# Patient Record
Sex: Female | Born: 1975
Health system: Southern US, Community
[De-identification: ages and names within clinical notes are randomized; demographics above are authoritative.]

## PROBLEM LIST (undated history)

## (undated) DIAGNOSIS — I499 Cardiac arrhythmia, unspecified: Secondary | ICD-10-CM

## (undated) DIAGNOSIS — M1990 Unspecified osteoarthritis, unspecified site: Secondary | ICD-10-CM

## (undated) DIAGNOSIS — Z8489 Family history of other specified conditions: Secondary | ICD-10-CM

## (undated) DIAGNOSIS — L309 Dermatitis, unspecified: Secondary | ICD-10-CM

## (undated) DIAGNOSIS — K219 Gastro-esophageal reflux disease without esophagitis: Secondary | ICD-10-CM

## (undated) DIAGNOSIS — T4145XA Adverse effect of unspecified anesthetic, initial encounter: Secondary | ICD-10-CM

## (undated) DIAGNOSIS — J45909 Unspecified asthma, uncomplicated: Secondary | ICD-10-CM

## (undated) DIAGNOSIS — L509 Urticaria, unspecified: Secondary | ICD-10-CM

## (undated) DIAGNOSIS — E785 Hyperlipidemia, unspecified: Secondary | ICD-10-CM

## (undated) DIAGNOSIS — R112 Nausea with vomiting, unspecified: Secondary | ICD-10-CM

## (undated) DIAGNOSIS — J069 Acute upper respiratory infection, unspecified: Secondary | ICD-10-CM

## (undated) DIAGNOSIS — F32A Depression, unspecified: Secondary | ICD-10-CM

## (undated) DIAGNOSIS — F419 Anxiety disorder, unspecified: Secondary | ICD-10-CM

## (undated) DIAGNOSIS — R519 Headache, unspecified: Secondary | ICD-10-CM

## (undated) DIAGNOSIS — I456 Pre-excitation syndrome: Secondary | ICD-10-CM

## (undated) DIAGNOSIS — G709 Myoneural disorder, unspecified: Secondary | ICD-10-CM

## (undated) DIAGNOSIS — T783XXA Angioneurotic edema, initial encounter: Secondary | ICD-10-CM

## (undated) DIAGNOSIS — J189 Pneumonia, unspecified organism: Secondary | ICD-10-CM

## (undated) DIAGNOSIS — E669 Obesity, unspecified: Secondary | ICD-10-CM

## (undated) DIAGNOSIS — I10 Essential (primary) hypertension: Secondary | ICD-10-CM

## (undated) DIAGNOSIS — Z9889 Other specified postprocedural states: Secondary | ICD-10-CM

## (undated) DIAGNOSIS — R51 Headache: Secondary | ICD-10-CM

## (undated) DIAGNOSIS — D563 Thalassemia minor: Secondary | ICD-10-CM

## (undated) DIAGNOSIS — E119 Type 2 diabetes mellitus without complications: Secondary | ICD-10-CM

## (undated) DIAGNOSIS — T8859XA Other complications of anesthesia, initial encounter: Secondary | ICD-10-CM

## (undated) HISTORY — DX: Angioneurotic edema, initial encounter: T78.3XXA

## (undated) HISTORY — PX: TUBAL LIGATION: SHX77

## (undated) HISTORY — DX: Type 2 diabetes mellitus without complications: E11.9

## (undated) HISTORY — PX: BLADDER REPAIR: SHX76

## (undated) HISTORY — DX: Urticaria, unspecified: L50.9

## (undated) HISTORY — PX: BREAST REDUCTION SURGERY: SHX8

## (undated) HISTORY — DX: Unspecified asthma, uncomplicated: J45.909

## (undated) HISTORY — PX: ABDOMINAL HYSTERECTOMY: SHX81

## (undated) HISTORY — DX: Acute upper respiratory infection, unspecified: J06.9

## (undated) HISTORY — DX: Dermatitis, unspecified: L30.9

## (undated) HISTORY — DX: Gastro-esophageal reflux disease without esophagitis: K21.9

## (undated) HISTORY — DX: Obesity, unspecified: E66.9

---

## 1999-10-01 HISTORY — PX: REDUCTION MAMMAPLASTY: SUR839

## 2013-10-09 ENCOUNTER — Emergency Department (HOSPITAL_COMMUNITY)
Admission: EM | Admit: 2013-10-09 | Discharge: 2013-10-09 | Disposition: A | Discharge status: Home / Self Care | Payer: Self-pay | Attending: Emergency Medicine | Admitting: Emergency Medicine

## 2013-10-09 ENCOUNTER — Emergency Department (HOSPITAL_COMMUNITY): Payer: Self-pay

## 2013-10-09 ENCOUNTER — Encounter (HOSPITAL_COMMUNITY): Payer: Self-pay | Admitting: Emergency Medicine

## 2013-10-09 DIAGNOSIS — J40 Bronchitis, not specified as acute or chronic: Secondary | ICD-10-CM

## 2013-10-09 DIAGNOSIS — Z880 Allergy status to penicillin: Secondary | ICD-10-CM | POA: Insufficient documentation

## 2013-10-09 DIAGNOSIS — I10 Essential (primary) hypertension: Secondary | ICD-10-CM | POA: Insufficient documentation

## 2013-10-09 DIAGNOSIS — J069 Acute upper respiratory infection, unspecified: Secondary | ICD-10-CM

## 2013-10-09 DIAGNOSIS — J310 Chronic rhinitis: Secondary | ICD-10-CM | POA: Insufficient documentation

## 2013-10-09 DIAGNOSIS — J3489 Other specified disorders of nose and nasal sinuses: Secondary | ICD-10-CM | POA: Insufficient documentation

## 2013-10-09 DIAGNOSIS — E785 Hyperlipidemia, unspecified: Secondary | ICD-10-CM | POA: Insufficient documentation

## 2013-10-09 DIAGNOSIS — Z79899 Other long term (current) drug therapy: Secondary | ICD-10-CM | POA: Insufficient documentation

## 2013-10-09 DIAGNOSIS — Z87891 Personal history of nicotine dependence: Secondary | ICD-10-CM | POA: Insufficient documentation

## 2013-10-09 DIAGNOSIS — J209 Acute bronchitis, unspecified: Secondary | ICD-10-CM | POA: Insufficient documentation

## 2013-10-09 HISTORY — DX: Pre-excitation syndrome: I45.6

## 2013-10-09 HISTORY — DX: Essential (primary) hypertension: I10

## 2013-10-09 HISTORY — DX: Hyperlipidemia, unspecified: E78.5

## 2013-10-09 MED ORDER — HYDROCOD POLST-CHLORPHEN POLST 10-8 MG/5ML PO LQCR: 5.0000 mL | Freq: Two times a day (BID) | ORAL | Status: DC | PRN

## 2013-10-09 MED ORDER — HYDROCOD POLST-CHLORPHEN POLST 10-8 MG/5ML PO LQCR
5.0000 mL | Freq: Once | ORAL | Status: AC
  Administered 2013-10-09: 5 mL via ORAL
  Filled 2013-10-09: qty 5

## 2013-10-09 NOTE — Discharge Instructions (Signed)
Bronchitis Bronchitis is swelling (inflammation) of the air tubes leading to your lungs (bronchi). This causes mucus and a cough. If the swelling gets bad, you may have trouble breathing. HOME CARE   Rest.  Drink enough fluids to keep your pee (urine) clear or pale yellow (unless you have a condition where you have to watch how much you drink).  Only take medicine as told by your doctor. If you were given antibiotic medicines, finish them even if you start to feel better.  Avoid smoke, irritating chemicals, and strong smells. These make the problem worse. Quit smoking if you smoke. This helps your lungs heal faster.  Use a cool mist humidifier. Change the water in the humidifier every day. You can also sit in the bathroom with hot shower running for 5 10 minutes. Keep the door closed.  See your health care provider as told.  Wash your hands often. GET HELP IF: Your problems do not get better after 1 week. GET HELP RIGHT AWAY IF:   Your fever gets worse.  You have chills.  Your chest hurts.  Your problems breathing get worse.  You have blood in your mucus.  You pass out (faint).  You feel lightheaded.  You have a bad headache.  You throw up (vomit) again and again. MAKE SURE YOU:  Understand these instructions.  Will watch your condition.  Will get help right away if you are not doing well or get worse. Document Released: 03/04/2008 Document Revised: 07/07/2013 Document Reviewed: 05/11/2013 [facility] Patient Information 2014 [employer], [place]. It is safe to take Coricidin HBP with your blood pressure medications for your, congestion.  You've also been given a prescription for Tussionex, achieving take for your cough so, you can sleep at night

## 2013-10-09 NOTE — ED Provider Notes (Signed)
Medical screening examination/treatment/procedure(s) were performed by non-physician practitioner and as supervising physician I was immediately available for consultation/collaboration.  Flint MelterElliott L Uvaldo Rybacki, MD 10/09/13 [id number]

## 2013-10-09 NOTE — ED Provider Notes (Signed)
CSN: [id number]     Arrival date & time 10/09/13  0142 History   First MD Initiated Contact with Carolyn Phillips 10/09/13 0320     Chief Complaint  Carolyn Phillips presents with  . Cough  . Pleurisy   (Consider location/radiation/quality/duration/timing/severity/associated sxs/prior Treatment) HPI Comments: Carolyn Phillips with URI, symptoms, including rhinorrhea, ingestion, ear fullness, worsens, and chronic cough 22 bronchitis.  Has been taking over-the-counter Mucinex and Robitussin without any relief.  Denies any fever, shortness of breath, chest pain  Carolyn Phillips is a 38 y.o. female presenting with cough. The history is provided by the Carolyn Phillips.  Cough Cough characteristics:  Productive Sputum characteristics:  Green Severity:  Mild Duration:  2 days Timing:  Intermittent Chronicity:  Recurrent Relieved by:  Nothing Worsened by:  Activity Ineffective treatments: Mucinex and Robitussin. Associated symptoms: ear fullness, ear pain, rhinorrhea and sinus congestion   Associated symptoms: no chest pain, no chills, no fever, no headaches, no myalgias, no shortness of breath, no sore throat and no wheezing     Past Medical History  Diagnosis Date  . Wolff-Parkinson-White syndrome   . Hypertension   . Hyperlipemia    Past Surgical History  Procedure Laterality Date  . Abdominal hysterectomy    . Bladder repair    . Breast reduction surgery     History reviewed. No pertinent family history. History  Substance Use Topics  . Smoking status: Former Games developermoker  . Smokeless tobacco: Not on file  . Alcohol Use: Yes   OB History   Grav Para Term Preterm Abortions TAB SAB Ect Mult Living                 Review of Systems  Constitutional: Negative for fever and chills.  HENT: Positive for congestion, ear pain and rhinorrhea. Negative for ear discharge, sore throat and trouble swallowing.   Respiratory: Positive for cough. Negative for shortness of breath and wheezing.   Cardiovascular: Negative for chest  pain.  Gastrointestinal: Negative for nausea.  Musculoskeletal: Negative for myalgias.  Neurological: Negative for dizziness and headaches.  All other systems reviewed and are negative.    Allergies  Penicillins  Home Medications   Current Outpatient Rx  Name  Route  Sig  Dispense  Refill  . Atorvastatin Calcium (LIPITOR PO)   Oral   Take 1 tablet by mouth daily.         Marland Kitchen. BIOTIN PO   Oral   Take 1 tablet by mouth daily.         . Cholecalciferol (VITAMIN D PO)   Oral   Take 1 tablet by mouth daily.         . Cyanocobalamin (VITAMIN B 12 PO)   Oral   Take 1 tablet by mouth daily.         . hydrochlorothiazide (HYDRODIURIL) 25 MG tablet   Oral   Take 25 mg by mouth daily.         . Multiple Vitamins-Minerals (ZINC PO)   Oral   Take 1 tablet by mouth daily.         . chlorpheniramine-HYDROcodone (TUSSIONEX) 10-8 MG/5ML LQCR   Oral   Take 5 mLs by mouth every 12 (twelve) hours as needed for cough.   60 mL   0    BP 142/94  Pulse 85  Temp(Src) 97.8 F (36.6 C) (Oral)  Resp 16  SpO2 99% Physical Exam  Vitals reviewed. Constitutional: She is oriented to person, place, and time. She appears well-developed and well-nourished.  HENT:  Head: Normocephalic.  Right Ear: External ear normal.  Left Ear: External ear normal.  Mouth/Throat: Oropharynx is clear and moist.  Voice is hoarse  Eyes: Pupils are equal, round, and reactive to light.  Neck: Normal range of motion.  Cardiovascular: Normal rate and regular rhythm.   Pulmonary/Chest: Effort normal and breath sounds normal.  Abdominal: Soft. She exhibits no distension. There is no tenderness.  Musculoskeletal: Normal range of motion.  Lymphadenopathy:    She has no cervical adenopathy.  Neurological: She is alert and oriented to person, place, and time.  Skin: Skin is dry.    ED Course  Procedures (including critical care time) Labs Review Labs Reviewed - No data to display Imaging  Review Dg Chest 2 View  10/09/2013   CLINICAL DATA:  Cough.  Intermittent fever.  EXAM: CHEST  2 VIEW  COMPARISON:  None.  FINDINGS: Heart size and mediastinal contours are within normal limits. Both lungs are clear. Visualized skeletal structures are unremarkable.  IMPRESSION: Negative exam.   Electronically Signed   By: Drusilla Kanner M.D.   On: 10/09/2013 02:34    EKG Interpretation   None       MDM   1. URI (upper respiratory infection)   2. Bronchitis        Arman Filter, NP 10/09/13 1610  Arman Filter, NP 10/09/13 [phone]

## 2013-10-09 NOTE — ED Notes (Signed)
Pt reports "tasting blood" when she coughs. Pt states that cough has been present x 1 week. Pt reports coughing up green sputum, and green mucus.

## 2013-10-09 NOTE — ED Notes (Signed)
Productive cough (green), "sinus infection, ears feel clogged." chest wall pain with coughing. Hx. Bronchitis.

## 2014-04-05 ENCOUNTER — Encounter: Payer: 59 | Attending: Internal Medicine

## 2014-04-05 VITALS — Ht 62.5 in | Wt 216.7 lb

## 2014-04-05 DIAGNOSIS — Z713 Dietary counseling and surveillance: Secondary | ICD-10-CM | POA: Insufficient documentation

## 2014-04-05 DIAGNOSIS — E119 Type 2 diabetes mellitus without complications: Secondary | ICD-10-CM | POA: Insufficient documentation

## 2014-04-05 NOTE — Progress Notes (Signed)
Patient was seen on 04/05/14 for the first of a series of three diabetes self-management courses at the [facility].  Current HbA1c: 6.7%  The following learning objectives were met by the patient during this class:  Describe diabetes  State some common risk factors for diabetes  Defines the role of glucose and insulin  Identifies type of diabetes and pathophysiology  Describe the relationship between diabetes and cardiovascular risk  State the members of the Healthcare Team  States the rationale for glucose monitoring  State when to test glucose  State their individual Target Range  State the importance of logging glucose readings  Describe how to interpret glucose readings  Identifies A1C target  Explain the correlation between A1c and eAG values  State symptoms and treatment of high blood glucose  State symptoms and treatment of low blood glucose  Explain proper technique for glucose testing  Identifies proper sharps disposal  Handouts given during class include:  Living Well with Diabetes book  Carb Counting and Meal Planning book  Meal Plan Card  Carbohydrate guide  Meal planning worksheet  Low Sodium Flavoring Tips  The diabetes portion plate  T9H to eAG Conversion Chart  Diabetes Medications  Diabetes Recommended Care Schedule  Support Group  Diabetes Success Plan  Core Class Satisfaction Survey  Follow-Up Plan:  Attend core 2

## 2014-04-12 DIAGNOSIS — E119 Type 2 diabetes mellitus without complications: Secondary | ICD-10-CM

## 2014-04-12 NOTE — Progress Notes (Signed)

## 2014-04-15 ENCOUNTER — Institutional Professional Consult (permissible substitution): Payer: Self-pay | Admitting: Interventional Cardiology

## 2014-04-19 DIAGNOSIS — E119 Type 2 diabetes mellitus without complications: Secondary | ICD-10-CM

## 2014-04-19 NOTE — Progress Notes (Signed)
Patient was seen on 04/19/14 for the third of a series of three diabetes self-management courses at the [facility]. The following learning objectives were met by the patient during this class:    State the amount of activity recommended for healthy living   Describe activities suitable for individual needs   Identify ways to regularly incorporate activity into daily life   Identify barriers to activity and ways to over come these barriers  Identify diabetes medications being personally used and their primary action for lowering glucose and possible side effects   Describe role of stress on blood glucose and develop strategies to address psychosocial issues   Identify diabetes complications and ways to prevent them  Explain how to manage diabetes during illness   Evaluate success in meeting personal goal   Establish 2-3 goals that they will plan to diligently work on until they return for the  42-monthfollow-up visit  Goals:  Follow Diabetes Meal Plan as instructed  Aim for 15-30 mins of physical activity daily as tolerated  Bring food record and glucose log to your follow up visit  Your patient has established the following 4 month goals in their individualized success plan: I will count my carb choices at most meals and snacks by using My Fitness Pal to count carbs and track steps per day I will reduce fat in my diet by eating less sugars as carbs 2 or more meals a day and eating my carb filled meal at lunch instead of dinner To help manage stress I will attend a Support Group at least 2 times a week  Your patient has identified these potential barriers to change:  Cost more to eat healthier  Your patient has identified their diabetes self-care support plan as  [facility]  Family Support  On-line resources  All of my Providers  Plan:  Attend Core 4 in 4 months

## 2014-05-25 ENCOUNTER — Encounter: Payer: Self-pay | Admitting: Interventional Cardiology

## 2014-05-25 ENCOUNTER — Ambulatory Visit (INDEPENDENT_AMBULATORY_CARE_PROVIDER_SITE_OTHER): Payer: 59 | Admitting: Interventional Cardiology

## 2014-05-25 VITALS — BP 130/86 | HR 68 | Ht 62.0 in | Wt 218.8 lb

## 2014-05-25 DIAGNOSIS — G4730 Sleep apnea, unspecified: Secondary | ICD-10-CM

## 2014-05-25 DIAGNOSIS — I456 Pre-excitation syndrome: Secondary | ICD-10-CM

## 2014-05-25 DIAGNOSIS — I10 Essential (primary) hypertension: Secondary | ICD-10-CM | POA: Insufficient documentation

## 2014-05-25 NOTE — Progress Notes (Signed)
Patient ID: Carolyn Phillips, female   DOB: 1976-05-02, 38 y.o.   MRN: [id number]   Date: 05/25/2014 ID: Carolyn Phillips, DOB 04-08-1976, MRN [id number] PCP: Gwynneth Aliment, MD  Reason: Palpitations and WPW  ASSESSMENT;  1. WPW with palpitations 2. Hypertension 3. Diabetes mellitus 4. Obesity 5. Snoring and probable sleep apnea  PLAN:  1. Sleep study. 2. Continuous ambulatory monitor for up to a month. 3. Consider referral to electrophysiology for accessory pathway ablation depending upon findings.   SUBJECTIVE: Carolyn Phillips is a 38 y.o. female who is a pleasant female who has a pre-existing history of WPW diagnosed when she was 38 years of age. She was told at that time that episodes of tachycardia were been precipitated by anxiety and stress. 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 Over the past several months she has had recurring episodes of palpitations associated with chest tightness, dyspnea, and weakness. These episodes last up to 3-5 minutes before resolving. She is frightened to by the increase in frequency and feels that if they were to persist for greater than 10-15 minutes she would have to go to the emergency room. She cannot predict when the episodes will occur. She has never had syncope. Many of the episodes have awakened her from sleep.   Allergies  Allergen Reactions  . Penicillins Swelling    Eye swelling    Current Outpatient Prescriptions on File Prior to Visit  Medication Sig Dispense Refill  . BIOTIN PO Take 1 tablet by mouth daily.      . Cholecalciferol (VITAMIN D PO) Take 1 tablet by mouth 2 (two) times a week.       . Cyanocobalamin (VITAMIN B 12 PO) Take 1 tablet by mouth daily.      . hydrochlorothiazide (HYDRODIURIL) 25 MG tablet Take 12.5 mg by mouth daily. 12.5 mg daily      . meclizine (ANTIVERT) 12.5 MG  tablet Take 12.5 mg by mouth 3 (three) times daily as needed for dizziness.      . Multiple Vitamins-Minerals (ZINC PO) Take 1 tablet by mouth daily.      . nebivolol (BYSTOLIC) 5 MG tablet Take 5 mg by mouth daily.      Marland Kitchen omeprazole (PRILOSEC) 20 MG capsule Take 20 mg by mouth daily.      . Probiotic Product (ALIGN) 4 MG CAPS Take by mouth.      . sitaGLIPtin-metformin (JANUMET) 50-500 MG per tablet Take 1 tablet by mouth 2 (two) times daily with a meal.       No current facility-administered medications on file prior to visit.    Past Medical History  Diagnosis Date  . Wolff-Parkinson-White syndrome   . Hypertension   . Hyperlipemia   . Diabetes mellitus without complication   . GERD (gastroesophageal reflux disease)   . Obesity     Past Surgical History  Procedure Laterality Date  . Abdominal hysterectomy    . Bladder repair    . Breast reduction surgery    . Tubal ligation      History   Social History  . Marital Status: Married    Spouse Name: N/A    Number of Children: N/A  . Years of Education: N/A   Occupational History  . Not on file.   Social History Main Topics  . Smoking status: Former Games developer  . Smokeless tobacco: Not on file  .  Alcohol Use: Yes  . Drug Use: No  . Sexual Activity: Not on file   Other Topics Concern  . Not on file   Social History Narrative  . No narrative on file    Family History  Problem Relation Age of Onset  . Valvular heart disease Mother     ROS: Denies syncope, claudication, edema, neurological symptoms, seizure disorder, anginal quality chest pain, and edema.. Other systems negative for complaints.  OBJECTIVE: BP 130/86  Pulse 68  Ht  (1.575 m)  Wt 218 lb 12.8 oz (99.247 kg)  BMI 40.01 kg/m2,  General: No acute distress, obese HEENT: normal without pallor or jaundice Neck: JVD flat. Carotids normal Chest: Clear Cardiac: Murmur: None. Gallop: S4. Rhythm: Normal. Other: Normal Abdomen: Bruit: Absent.  Pulsation: Absent Extremities: Edema: Absent. Pulses: 2+ and symmetric Neuro: Normal Psych: Normal  ECG: Sinus rhythm with WPW type B with upright complexes in one and aVL with negative complexes throughout the precordium and leftward axis.

## 2014-05-25 NOTE — Patient Instructions (Signed)
Your physician recommends that you continue on your current medications as directed. Please refer to the Current Medication list given to you today.   Your physician has requested that you have an echocardiogram. Echocardiography is a painless test that uses sound waves to create images of your heart. It provides your doctor with information about the size and shape of your heart and how well your heart's chambers and valves are working. This procedure takes approximately one hour. There are no restrictions for this procedure.   Your physician has recommended that you wear an event monitor. Event monitors are medical devices that record the heart's electrical activity. Doctors most often [place] these monitors to diagnose arrhythmias. Arrhythmias are problems with the speed or rhythm of the heartbeat. The monitor is a small, portable device. You can wear one while you do your normal daily activities. This is usually used to diagnose what is causing palpitations/syncope (passing out).  Your physician has recommended that you have a sleep study. This test records several body functions during sleep, including: brain activity, eye movement, oxygen and carbon dioxide blood levels, heart rate and rhythm, breathing rate and rhythm, the flow of air through your mouth and nose, snoring, body muscle movements, and chest and belly movement.  Your physician recommends that you schedule a follow-up appointment in: 1 month

## 2014-05-31 ENCOUNTER — Encounter: Payer: Self-pay | Admitting: *Deleted

## 2014-05-31 ENCOUNTER — Encounter (INDEPENDENT_AMBULATORY_CARE_PROVIDER_SITE_OTHER): Payer: BC Managed Care – PPO

## 2014-05-31 ENCOUNTER — Ambulatory Visit (HOSPITAL_COMMUNITY): Payer: BC Managed Care – PPO | Attending: Interventional Cardiology

## 2014-05-31 DIAGNOSIS — E669 Obesity, unspecified: Secondary | ICD-10-CM | POA: Diagnosis not present

## 2014-05-31 DIAGNOSIS — Z87891 Personal history of nicotine dependence: Secondary | ICD-10-CM | POA: Diagnosis not present

## 2014-05-31 DIAGNOSIS — I10 Essential (primary) hypertension: Secondary | ICD-10-CM | POA: Diagnosis not present

## 2014-05-31 DIAGNOSIS — I456 Pre-excitation syndrome: Secondary | ICD-10-CM | POA: Diagnosis present

## 2014-05-31 DIAGNOSIS — G4730 Sleep apnea, unspecified: Secondary | ICD-10-CM | POA: Diagnosis not present

## 2014-05-31 DIAGNOSIS — E119 Type 2 diabetes mellitus without complications: Secondary | ICD-10-CM | POA: Diagnosis not present

## 2014-05-31 DIAGNOSIS — I471 Supraventricular tachycardia: Secondary | ICD-10-CM

## 2014-05-31 NOTE — Progress Notes (Signed)
Patient ID: Carolyn Phillips, female   DOB: [birth year], 38 y.o.   MRN: [id number] [employer] 30 day cardiac event monitor applied to patient.

## 2014-05-31 NOTE — Progress Notes (Signed)
2D Echo completed. 05/31/2014 

## 2014-06-02 ENCOUNTER — Telehealth: Payer: Self-pay

## 2014-06-02 NOTE — Telephone Encounter (Signed)
Message copied by Jarvis Newcomer on Thu Jun 02, 2014  9:22 AM ------      Message from: Verdis Prime      Created: Tue May 31, 2014  7:10 PM       Heart is structurally normal ------

## 2014-06-02 NOTE — Telephone Encounter (Signed)
pt aware of echo results.Heart is structurally normal.pt verbalized understanding.

## 2014-06-20 ENCOUNTER — Telehealth: Payer: Self-pay | Admitting: Interventional Cardiology

## 2014-06-20 NOTE — Telephone Encounter (Signed)
New message  patient had episode on Friday evening. [employer] called they wanted to called 911.   Was told by [employer] they  would be calling Dr. Katrinka Blazing to review.

## 2014-06-20 NOTE — Telephone Encounter (Signed)
Pt states Friday evening 06/17/14 about 7:30PM while watching TV she felt her heart was beating out of her chest. Pt states this lasted 1-1.63minutes. Pt has not had any symptoms since that time and she feels fine now.  Pt aware we are trying to locate tracings from that episode.

## 2014-06-20 NOTE — Telephone Encounter (Signed)
I have given message to [facility] to get tracings from Friday evening.

## 2014-06-20 NOTE — Telephone Encounter (Signed)
Received transmission from 06/17/14 06:33 pm (CST). Reviewed by Dr Clifton James: --wide complex tachycardia rate 176.5-182.9 bpm. --pt having no symptoms over the last 72 hours.  --OK to review with Dr Katrinka Blazing tomorrow.    Pt advised transmission has been reviewed by Dr Clifton James and will be forwarded to Dr Katrinka Blazing for review 06/21/14. Pt advised to call 911 immediately if she experiences similar symptoms.   Pt verbalized understanding.   Transmissions -06/17/14 06:31pm (CST)                            06/17/14 06:33pm (CST)                            06/17/14 06:50pm(CST)                            06/12/14 05:31 am (CST) Placed in Dr [employer] mailbox in Team Room B

## 2014-06-21 ENCOUNTER — Telehealth: Payer: Self-pay

## 2014-06-21 NOTE — Telephone Encounter (Signed)
Addressed by Dr.Smith who spoke with the pt directly. Pt EP consult dx WPW scheduled with Dr.Taylor for 9/29 :30am. Pt aware of appt

## 2014-06-21 NOTE — Telephone Encounter (Signed)
pt aware of new EP consult with Dr.Taylor on 9/29 :30am. appt with Dr.Smith cancelled

## 2014-06-23 ENCOUNTER — Ambulatory Visit: Payer: 59 | Admitting: Interventional Cardiology

## 2014-06-28 ENCOUNTER — Encounter: Payer: Self-pay | Admitting: Internal Medicine

## 2014-06-28 ENCOUNTER — Encounter: Payer: Self-pay | Admitting: *Deleted

## 2014-06-28 ENCOUNTER — Ambulatory Visit (INDEPENDENT_AMBULATORY_CARE_PROVIDER_SITE_OTHER): Payer: BC Managed Care – PPO | Admitting: Internal Medicine

## 2014-06-28 VITALS — BP 130/84 | HR 78 | Ht 63.0 in | Wt 219.4 lb

## 2014-06-28 DIAGNOSIS — I456 Pre-excitation syndrome: Secondary | ICD-10-CM

## 2014-06-28 LAB — CBC WITH DIFFERENTIAL/PLATELET
BASOS PCT: 0.3 % (ref 0.0–3.0)
Basophils Absolute: 0 10*3/uL (ref 0.0–0.1)
EOS ABS: 0.1 10*3/uL (ref 0.0–0.7)
Eosinophils Relative: 1.1 % (ref 0.0–5.0)
HEMATOCRIT: 39.3 % (ref 36.0–46.0)
HEMOGLOBIN: 12.6 g/dL (ref 12.0–15.0)
Lymphocytes Relative: 32.6 % (ref 12.0–46.0)
Lymphs Abs: 3 10*3/uL (ref 0.7–4.0)
MCHC: 32.1 g/dL (ref 30.0–36.0)
MCV: 72.9 fl — AB (ref 78.0–100.0)
Monocytes Absolute: 0.5 10*3/uL (ref 0.1–1.0)
Monocytes Relative: 5.3 % (ref 3.0–12.0)
NEUTROS ABS: 5.5 10*3/uL (ref 1.4–7.7)
Neutrophils Relative %: 60.7 % (ref 43.0–77.0)
Platelets: 379 10*3/uL (ref 150.0–400.0)
RBC: 5.39 Mil/uL — AB (ref 3.87–5.11)
RDW: 16.5 % — ABNORMAL HIGH (ref 11.5–15.5)
WBC: 9.1 10*3/uL (ref 4.0–10.5)

## 2014-06-28 LAB — BASIC METABOLIC PANEL
BUN: 12 mg/dL (ref 6–23)
CHLORIDE: 103 meq/L (ref 96–112)
CO2: 26 meq/L (ref 19–32)
Calcium: 9.1 mg/dL (ref 8.4–10.5)
Creatinine, Ser: 1 mg/dL (ref 0.4–1.2)
GFR: 80.86 mL/min (ref >60.00)
Glucose, Bld: 118 mg/dL — ABNORMAL HIGH (ref 70–99)
POTASSIUM: 3.7 meq/L (ref 3.5–5.1)
Sodium: 136 mEq/L (ref 135–145)

## 2014-06-28 NOTE — Progress Notes (Signed)
HPI Ms. Carolyn Phillips is referred today by Dr. Katrinka BlazingSmith for consideration for catheter ablation. She is a pleasant 38 yo woman with a h/o WPW syndrome initially diagnosed at age 38. He symptoms may occur at any time but especially during physical exertion which have kept her from exercising. She has never had syncope. She fairly significant pre-excitation and what appears to be a right free wall AP, or right posteroseptal in location. She has not had documented atrial fib. Review of her cardiac monitor demonstrates what appears to be a wide QRS tachycardia which may well be anti-dromic SVT. She has never required adenosine. In the past she has been treated with multiple beta blockers which leave her fatigued. Allergies  Allergen Reactions  . Penicillins Hives and Swelling    Eye swelling     Current Outpatient Prescriptions  Medication Sig Dispense Refill  . BIOTIN PO Take 2 tablets by mouth daily.       . Cholecalciferol (VITAMIN D PO) Take 1 tablet by mouth 2 (two) times a week.       . Cyanocobalamin (VITAMIN B 12 PO) Take 1 tablet by mouth daily.      . hydrochlorothiazide (HYDRODIURIL) 25 MG tablet 12.5 mg daily      . meclizine (ANTIVERT) 12.5 MG tablet Take 12.5 mg by mouth 3 (three) times daily as needed for dizziness.      . Multiple Vitamins-Minerals (ZINC PO) Take 1 tablet by mouth daily.      . nebivolol (BYSTOLIC) 5 MG tablet Take 5 mg by mouth daily.      Marland Kitchen. omeprazole (PRILOSEC) 20 MG capsule Take 20 mg by mouth daily.      . Probiotic Product (ALIGN) 4 MG CAPS Take 1 capsule by mouth daily.       . sitaGLIPtin-metformin (JANUMET) 50-500 MG per tablet Take 1 tablet by mouth 2 (two) times daily with a meal.       No current facility-administered medications for this visit.     Past Medical History  Diagnosis Date  . Wolff-Parkinson-White syndrome   . Hypertension   . Hyperlipemia   . Diabetes mellitus without complication   . GERD (gastroesophageal reflux disease)   .  Obesity     ROS:   All systems reviewed and negative except as noted in the HPI.   Past Surgical History  Procedure Laterality Date  . Abdominal hysterectomy    . Bladder repair    . Breast reduction surgery    . Tubal ligation       Family History  Problem Relation Age of Onset  . Valvular heart disease Mother      History   Social History  . Marital Status: Married    Spouse Name: N/A    Number of Children: N/A  . Years of Education: N/A   Occupational History  . Not on file.   Social History Main Topics  . Smoking status: Former Games developermoker  . Smokeless tobacco: Not on file  . Alcohol Use: Yes  . Drug Use: No  . Sexual Activity: Not on file   Other Topics Concern  . Not on file   Social History Narrative  . No narrative on file     BP 130/84  Pulse 78  Ht 5\' 3"  (1.6 m)  Wt 219 lb 6.4 oz (99.519 kg)  BMI 38.87 kg/m2  Physical Exam:  obese appearing 38 yo woman, NAD HEENT: Unremarkable Neck:  No JVD, no thyromegally  Back:  No CVA tenderness Lungs:  Clear with no wheezes HEART:  Regular rate rhythm, no murmurs, no rubs, no clicks Abd:  soft, positive bowel sounds, no organomegally, no rebound, no guarding Ext:  2 plus pulses, no edema, no cyanosis, no clubbing Skin:  No rashes no nodules Neuro:  CN II through XII intact, motor grossly intact  EKG NSR with manifest WPW syndrome.  Assess/Plan:

## 2014-06-28 NOTE — Assessment & Plan Note (Signed)
I have discussed the treatment options with the patient. The risks/benefits/goals/expectations of catheter ablation have been discussed with the patient and she wishes to proceed.

## 2014-06-28 NOTE — Patient Instructions (Signed)
Your physician has recommended that you have an ablation. Catheter ablation is a medical procedure used to treat some cardiac arrhythmias (irregular heartbeats). During catheter ablation, a long, thin, flexible tube is put into a blood vessel in your groin (upper thigh), or neck. This tube is called an ablation catheter. It is then guided to your heart through the blood vessel. Radio frequency waves destroy small areas of heart tissue where abnormal heartbeats may cause an arrhythmia to start. Please see the instruction sheet given to you today.   See instruction sheet for ablation  Thank you for choosing [facility]!!     Samarah Hogle, RN [phone]  

## 2014-06-29 ENCOUNTER — Telehealth: Payer: Self-pay | Admitting: Internal Medicine

## 2014-06-29 ENCOUNTER — Encounter (HOSPITAL_COMMUNITY): Payer: Self-pay | Admitting: Pharmacy Technician

## 2014-06-29 NOTE — Telephone Encounter (Signed)
New message    Patient calling     Ablation on  10/5 . Patient forgot to let the MD know that she get sick under anesthesia  C/O has a sore throat   this am took dayQuil

## 2014-06-29 NOTE — Telephone Encounter (Signed)
Spoke with patient and let her know as long as she is afebrile then okay to proceed with the ablation.  Is she starts to run a fever she should call me.  Also let her know to let the nurses is short stay that are getting her ready for procedure that anesthesia makes her sick

## 2014-06-30 ENCOUNTER — Telehealth: Payer: Self-pay | Admitting: Internal Medicine

## 2014-06-30 NOTE — Telephone Encounter (Addendum)
Went to the doctor today and fever 99.1 and was put on Levaquin.  I sent message to Dr Ladona Ridgelaylor to ask about upcoming procedure on Monday.    [phone]--[phone]--call tomorrow

## 2014-06-30 NOTE — Telephone Encounter (Signed)
New message           Pt is sick and was prescribed an antibiotic but wants to know if the Levofloxacin 500mg  1tb a day for 10 days will effect her upcoming procedure

## 2014-07-01 NOTE — Telephone Encounter (Signed)
Dr Ladona Ridgelaylor advises to proceed as scheduled unless she is febrile on Sunday.  I left a message for patient with his recommendations.  Advised that she will need to cancel in febrile on Sunday and to call the hospital or on call for the office and advise if she is going to cancel.

## 2014-07-04 ENCOUNTER — Encounter (HOSPITAL_COMMUNITY): Payer: Self-pay | Admitting: General Practice

## 2014-07-04 ENCOUNTER — Encounter (HOSPITAL_COMMUNITY)
Admission: RE | Disposition: A | Discharge status: Home / Self Care | Payer: Self-pay | Source: Ambulatory Visit | Attending: Internal Medicine

## 2014-07-04 ENCOUNTER — Ambulatory Visit (HOSPITAL_COMMUNITY)
Admission: RE | Admit: 2014-07-04 | Discharge: 2014-07-05 | Disposition: A | Discharge status: Home / Self Care | Payer: BC Managed Care – PPO | Source: Ambulatory Visit | Attending: Internal Medicine | Admitting: Internal Medicine

## 2014-07-04 DIAGNOSIS — I471 Supraventricular tachycardia: Secondary | ICD-10-CM

## 2014-07-04 DIAGNOSIS — E785 Hyperlipidemia, unspecified: Secondary | ICD-10-CM | POA: Insufficient documentation

## 2014-07-04 DIAGNOSIS — E119 Type 2 diabetes mellitus without complications: Secondary | ICD-10-CM | POA: Insufficient documentation

## 2014-07-04 DIAGNOSIS — Z6838 Body mass index (BMI) 38.0-38.9, adult: Secondary | ICD-10-CM | POA: Insufficient documentation

## 2014-07-04 DIAGNOSIS — I10 Essential (primary) hypertension: Secondary | ICD-10-CM | POA: Insufficient documentation

## 2014-07-04 DIAGNOSIS — Z87891 Personal history of nicotine dependence: Secondary | ICD-10-CM | POA: Insufficient documentation

## 2014-07-04 DIAGNOSIS — Z79899 Other long term (current) drug therapy: Secondary | ICD-10-CM | POA: Insufficient documentation

## 2014-07-04 DIAGNOSIS — E669 Obesity, unspecified: Secondary | ICD-10-CM | POA: Insufficient documentation

## 2014-07-04 DIAGNOSIS — K219 Gastro-esophageal reflux disease without esophagitis: Secondary | ICD-10-CM | POA: Insufficient documentation

## 2014-07-04 DIAGNOSIS — I456 Pre-excitation syndrome: Secondary | ICD-10-CM | POA: Insufficient documentation

## 2014-07-04 DIAGNOSIS — Z23 Encounter for immunization: Secondary | ICD-10-CM | POA: Insufficient documentation

## 2014-07-04 HISTORY — DX: Nausea with vomiting, unspecified: R11.2

## 2014-07-04 HISTORY — DX: Other specified postprocedural states: Z98.890

## 2014-07-04 HISTORY — DX: Other complications of anesthesia, initial encounter: T88.59XA

## 2014-07-04 HISTORY — DX: Adverse effect of unspecified anesthetic, initial encounter: T41.45XA

## 2014-07-04 HISTORY — PX: SUPRAVENTRICULAR TACHYCARDIA ABLATION: SHX5492

## 2014-07-04 LAB — GLUCOSE, CAPILLARY
Glucose-Capillary: 108 mg/dL — ABNORMAL HIGH (ref 70–99)
Glucose-Capillary: 136 mg/dL — ABNORMAL HIGH (ref 70–99)

## 2014-07-04 SURGERY — SUPRAVENTRICULAR TACHYCARDIA ABLATION
Anesthesia: LOCAL

## 2014-07-04 MED ORDER — BUPIVACAINE HCL (PF) 0.25 % IJ SOLN
INTRAMUSCULAR | Status: AC
  Filled 2014-07-04: qty 30

## 2014-07-04 MED ORDER — INFLUENZA VAC SPLIT QUAD 0.5 ML IM SUSY
0.5000 mL | PREFILLED_SYRINGE | INTRAMUSCULAR | Status: AC
  Administered 2014-07-05: 0.5 mL via INTRAMUSCULAR
  Filled 2014-07-04: qty 0.5

## 2014-07-04 MED ORDER — SODIUM CHLORIDE 0.9 % IJ SOLN
3.0000 mL | Freq: Two times a day (BID) | INTRAMUSCULAR | Status: DC
  Administered 2014-07-04: 3 mL via INTRAVENOUS

## 2014-07-04 MED ORDER — FENTANYL CITRATE 0.05 MG/ML IJ SOLN
INTRAMUSCULAR | Status: AC
  Filled 2014-07-04: qty 2

## 2014-07-04 MED ORDER — MIDAZOLAM HCL 5 MG/5ML IJ SOLN
INTRAMUSCULAR | Status: AC
  Filled 2014-07-04: qty 5

## 2014-07-04 MED ORDER — NEBIVOLOL HCL 5 MG PO TABS
5.0000 mg | ORAL_TABLET | Freq: Every day | ORAL | Status: DC
  Administered 2014-07-04 – 2014-07-05 (×2): 5 mg via ORAL
  Filled 2014-07-04 (×2): qty 1

## 2014-07-04 MED ORDER — PNEUMOCOCCAL VAC POLYVALENT 25 MCG/0.5ML IJ INJ
0.5000 mL | INJECTION | INTRAMUSCULAR | Status: AC
  Administered 2014-07-05: 0.5 mL via INTRAMUSCULAR
  Filled 2014-07-04: qty 0.5

## 2014-07-04 MED ORDER — HEPARIN (PORCINE) IN NACL 2-0.9 UNIT/ML-% IJ SOLN
INTRAMUSCULAR | Status: AC
  Filled 2014-07-04: qty 500

## 2014-07-04 MED ORDER — LEVOFLOXACIN 500 MG PO TABS
500.0000 mg | ORAL_TABLET | Freq: Every day | ORAL | Status: DC
  Administered 2014-07-04 – 2014-07-05 (×2): 500 mg via ORAL
  Filled 2014-07-04 (×2): qty 1

## 2014-07-04 MED ORDER — ONDANSETRON HCL 4 MG/2ML IJ SOLN: 4.0000 mg | Freq: Four times a day (QID) | INTRAMUSCULAR | Status: DC | PRN

## 2014-07-04 MED ORDER — SODIUM CHLORIDE 0.9 % IV SOLN: 250.0000 mL | INTRAVENOUS | Status: DC | PRN

## 2014-07-04 MED ORDER — SODIUM CHLORIDE 0.9 % IJ SOLN: 3.0000 mL | INTRAMUSCULAR | Status: DC | PRN

## 2014-07-04 MED ORDER — VITAMIN D (ERGOCALCIFEROL) 1.25 MG (50000 UNIT) PO CAPS
50000.0000 [IU] | ORAL_CAPSULE | ORAL | Status: DC
  Administered 2014-07-04: 50000 [IU] via ORAL
  Filled 2014-07-04: qty 1

## 2014-07-04 MED ORDER — ONDANSETRON HCL 4 MG/2ML IJ SOLN
INTRAMUSCULAR | Status: AC
  Filled 2014-07-04: qty 2

## 2014-07-04 MED ORDER — MECLIZINE HCL 12.5 MG PO TABS
12.5000 mg | ORAL_TABLET | Freq: Three times a day (TID) | ORAL | Status: DC | PRN
  Filled 2014-07-04: qty 1

## 2014-07-04 MED ORDER — ACETAMINOPHEN 325 MG PO TABS
650.0000 mg | ORAL_TABLET | ORAL | Status: DC | PRN
  Administered 2014-07-04 – 2014-07-05 (×2): 650 mg via ORAL
  Filled 2014-07-04 (×2): qty 2

## 2014-07-04 NOTE — H&P (View-Only) (Signed)
HPI Carolyn Phillips is referred today by Dr. Katrinka BlazingSmith for consideration for catheter ablation. She is a pleasant 38 yo woman with a h/o WPW syndrome initially diagnosed at age 38. He symptoms may occur at any time but especially during physical exertion which have kept her from exercising. She has never had syncope. She fairly significant pre-excitation and what appears to be a right free wall AP, or right posteroseptal in location. She has not had documented atrial fib. Review of her cardiac monitor demonstrates what appears to be a wide QRS tachycardia which may well be anti-dromic SVT. She has never required adenosine. In the past she has been treated with multiple beta blockers which leave her fatigued. Allergies  Allergen Reactions  . Penicillins Hives and Swelling    Eye swelling     Current Outpatient Prescriptions  Medication Sig Dispense Refill  . BIOTIN PO Take 2 tablets by mouth daily.       . Cholecalciferol (VITAMIN D PO) Take 1 tablet by mouth 2 (two) times a week.       . Cyanocobalamin (VITAMIN B 12 PO) Take 1 tablet by mouth daily.      . hydrochlorothiazide (HYDRODIURIL) 25 MG tablet 12.5 mg daily      . meclizine (ANTIVERT) 12.5 MG tablet Take 12.5 mg by mouth 3 (three) times daily as needed for dizziness.      . Multiple Vitamins-Minerals (ZINC PO) Take 1 tablet by mouth daily.      . nebivolol (BYSTOLIC) 5 MG tablet Take 5 mg by mouth daily.      Marland Kitchen. omeprazole (PRILOSEC) 20 MG capsule Take 20 mg by mouth daily.      . Probiotic Product (ALIGN) 4 MG CAPS Take 1 capsule by mouth daily.       . sitaGLIPtin-metformin (JANUMET) 50-500 MG per tablet Take 1 tablet by mouth 2 (two) times daily with a meal.       No current facility-administered medications for this visit.     Past Medical History  Diagnosis Date  . Wolff-Parkinson-White syndrome   . Hypertension   . Hyperlipemia   . Diabetes mellitus without complication   . GERD (gastroesophageal reflux disease)   .  Obesity     ROS:   All systems reviewed and negative except as noted in the HPI.   Past Surgical History  Procedure Laterality Date  . Abdominal hysterectomy    . Bladder repair    . Breast reduction surgery    . Tubal ligation       Family History  Problem Relation Age of Onset  . Valvular heart disease Mother      History   Social History  . Marital Status: Married    Spouse Name: N/A    Number of Children: N/A  . Years of Education: N/A   Occupational History  . Not on file.   Social History Main Topics  . Smoking status: Former Games developermoker  . Smokeless tobacco: Not on file  . Alcohol Use: Yes  . Drug Use: No  . Sexual Activity: Not on file   Other Topics Concern  . Not on file   Social History Narrative  . No narrative on file     BP 130/84  Pulse 78  Ht 5\' 3"  (1.6 m)  Wt 219 lb 6.4 oz (99.519 kg)  BMI 38.87 kg/m2  Physical Exam:  obese appearing 38 yo woman, NAD HEENT: Unremarkable Neck:  No JVD, no thyromegally  Back:  No CVA tenderness Lungs:  Clear with no wheezes HEART:  Regular rate rhythm, no murmurs, no rubs, no clicks Abd:  soft, positive bowel sounds, no organomegally, no rebound, no guarding Ext:  2 plus pulses, no edema, no cyanosis, no clubbing Skin:  No rashes no nodules Neuro:  CN II through XII intact, motor grossly intact  EKG NSR with manifest WPW syndrome.  Assess/Plan:

## 2014-07-04 NOTE — Interval H&P Note (Signed)
History and Physical Interval Note:  07/04/2014 7:12 AM  Carolyn Delfino LovettLeann Alessio  has presented today for surgery, with the diagnosis of WPW  The various methods of treatment have been discussed with the patient and family. After consideration of risks, benefits and other options for treatment, the patient has consented to  Procedure(s): WPW ABLATION (N/A) as a surgical intervention .  The patient's history has been reviewed, patient examined, no change in status, stable for surgery.  I have reviewed the patient's chart and labs.  Questions were answered to the patient's satisfaction.     Lewayne BuntingGregg Taylor

## 2014-07-04 NOTE — Progress Notes (Signed)
IV saline locked. 

## 2014-07-04 NOTE — Progress Notes (Signed)
Site area: Right Internal Jugular a 207fr sheath was removed  Site Prior to Removal:  Level 0  Pressure Applied For 15 MINUTES    Minutes Beginning at 1300  Manual:   Yes.    Patient Status During Pull:  stable  Post Pull Groin Site:  Level 0  Post Pull Instructions Given:  Yes.    Post Pull Pulses Present:  Yes.    Dressing Applied:  Yes.    Comments:  VS remain stable thru sheath removal.  Pt denies any discomfort at this time at site.

## 2014-07-04 NOTE — Progress Notes (Signed)
Site area: rt groin Site Prior to Removal:  Level 0 Pressure Applied For: 15 Manual:   yes Patient Status During Pull:  stable Post Pull Site:  Level 0 Post Pull Instructions Given:  yes Post Pull Pulses Present: yes Dressing Applied:  tegaderm Bedrest begins @ 1245 Comments: no complications

## 2014-07-04 NOTE — CV Procedure (Signed)
Electrophysiology Procedure Note    Procedure: Electrophysiologic study and catheter ablation of AV reentrant tachycardia utilizing a manifest right-sided accessory pathway Preoperative diagnosis: Recurrent and medically refractory SVT   Postoperative diagnosis: AV reentrant tachycardia, status post catheter ablation   Description of procedure: After informed consent was obtained, the patient was taken to the diagnostic electrophysiologic laboratory in the fasting state. After the usual preparation and draping, intravenous Versed and fentanyl were used for sedation. A 6 French decapolar catheter was inserted percutaneously into the right femoral vein and advanced to the right ventricle. A 6 French quadripolar catheter was inserted percutaneously the right femoral vein and advanced to the his bundle. A 6 French quadripolar catheter was inserted percutaneously the right jugular vein and advanced to the coronary sinus region. A 7 French 20 pole halo catheter was inserted percutaneously into the right femoral vein and advanced to the right atrium. After measuring the basic intervals, rapid ventricular pacing was carried out from the right ventricle at a pacing cycle of 600 ms, and stepwise decrease down to 350 ms, where ventricular refractoriness was observed. The atrial activation remained midline but was not decremental. Programmed ventricular stimulation was carried out from the right ventricle at a cycle length of 500 ms. The S1-S2 was decreased from 440 ms down to 320 ms where ventricular refractoriness was observed. During programmed ventricular stimulation, the atrial activation sequence was midline but not decremental. Next, programmed atrial stimulation was carried out from the atrium at a pacing cycle length of 500 ms. The S1 and S2 interval step was decreased from 440 ms down to 330 ms where SVT was induced. The patient spontaneously reverted to atrial fibrillation with intermittent ventricular  preexcitation. Next rapid atrial pacing was carried out from the coronary sinus at a pacing cycle length of 500 ms. The pacing interval was decreased down to 300 where preexcitation resolved. A diagnosis of AV  reentrant tachycardia was made utilizing a right-sided accessory pathway.Mapping of the accessory pathway was carried out during ventricular pacing. The earliest atrial activation during ventricular pacing was seen in the His bundle region. This was even earlier in the proximal coronary sinus. At this point the 24 halo catheter was removed. A 7 French quadripolar ablation catheter was inserted percutaneously through the right femoral vein and advanced under fluoroscopic guidance to the right atrium. Additional mapping was carried out. The earliest atrial activation during ventricular pacing was seen in the mid septal region very close to the AV node. 4 radiofrequency energy applications were delivered. During the fourthradiofrequency energy application, the patient's ventricular preexcitation resolved. Additional rapid atrial and ventricular pacing were carried out. Programmed atrial and ventricular stimulation were also carried out. The patient was observed for 20 minutes. She had no residual accessory pathway conduction. There is no heart block. She had no inducible SVT. The catheters were removed, and the patient was returned to the recovery area for removal of the sheaths.  Complications: There were no immediate she could complications  Results.  1. Baseline ECG. Baseline ECG demonstrated normal sinus rhythm with manifest WPW syndrome 2. Baseline intervals. The sinus node cycle length was 790 ms. The AH interval was 52 ms. The HV interval was 18 ms. The QRS duration 118 ms. Following ablation, there is no appreciable difference in these intervals except the HV interval was 52 ms. 3. Rapid ventricular pacing. Rapid ventricular pacing was carried out from the right ventricle a pacing cycle length of  500 ms. The pacing rate was reduced down  to 320 ms. During rapid ventricular pacing, the atrial activation was midline and non-decremental.  4. Programmed ventricular stimulation. Programmed ventricular stimulation was carried out from the right ventricle at a pacing cycle length of 500 ms. S1-S2 interval was stepwise decreased from 440 ms down to 280 ms. Ventricular refractoriness was observed. During programmed ventricular stimulation, the atrial activation was midline and non-decremental.  5. Rapid atrial pacing. Rapid atrial pacing was carried out from the coronary sinus and the right atrium a pacing cycle length of 500 ms. The patient interval was decreased down to 380 ms. During rapid atrial pacing, there was maximal preexcitation seen.Following ablation, the PR interval was less than the RR interval and there is no ventricular preexcitation. 6. Programmed atrial stimulation. Programmed atrial stimulation was carried out from the atrium at a pacing cycle length of 500 ms. The S1-S2 interval was decreased from 440 ms down to 330 ms where her SVT was induced. AV  reentrant tachycardia was induced by pacing in an S1-S2 interval of 500/330.   7. Arrhythmias observed. A.AV reentrant tachycardia. Cycle length 338 ms. Method of initiation was with rapid atrial pacing as well as programmed atrial stimulation. Duration sustained. Method of termination, rapid ventricular pacing.  B. Atrial fibrillation. method of initiation was spontaneous from SVT method of termination was spontaneous, duration was sustained 8. Mapping. Mapping of the septal region of the right atrium demonstrated the earliest atrial activation during SVT as well as during rapid ventricular pacing was in the mid septal region of the right atrium very close to the AV node.  9. Radio frequency energy application. 4 radiofrequency energy applications were delivered. During radiofrequency energy application, there was loss of ventricular  preexcitation.  Conclusion: Successful electrophysiologic study and catheter ablation of AV reentrant tachycardia utilizing a manifest right mid septal accessory pathway with 4 radiofrequency energy applications delivered to the atrial and insertion of the accessory pathway.   Lewayne BuntingGregg Taylor, M.D.

## 2014-07-05 DIAGNOSIS — I456 Pre-excitation syndrome: Secondary | ICD-10-CM

## 2014-07-05 LAB — GLUCOSE, CAPILLARY: Glucose-Capillary: 116 mg/dL — ABNORMAL HIGH (ref 70–99)

## 2014-07-05 NOTE — Discharge Instructions (Signed)
**  PLEASE REMEMBER TO BRING ALL OF YOUR MEDICATIONS TO EACH OF YOUR FOLLOW-UP OFFICE VISITS.  Groin Site Care Refer to this sheet in the next few weeks. These instructions provide you with information on caring for yourself after your procedure. Your caregiver may also give you more specific instructions. Your treatment has been planned according to current medical practices, but problems sometimes occur. Call your caregiver if you have any problems or questions after your procedure. HOME CARE INSTRUCTIONS  You may shower 24 hours after the procedure. Remove the bandage (dressing) and gently wash the site with plain soap and water. Gently pat the site dry.   Do not apply powder or lotion to the site.   Do not sit in a bathtub, swimming pool, or whirlpool for 5 to 7 days.   No bending, squatting, or lifting anything over 10 pounds (4.5 kg) as directed by your caregiver.   Inspect the site at least twice daily.   What to expect:  Any bruising will usually fade within 1 to 2 weeks.   Blood that collects in the tissue (hematoma) may be painful to the touch. It should usually decrease in size and tenderness within 1 to 2 weeks.  SEEK IMMEDIATE MEDICAL CARE IF:  You have unusual pain at the groin site or down the affected leg.   You have redness, warmth, swelling, or pain at the groin site.   You have drainage (other than a small amount of blood on the dressing).   You have chills.   You have a fever or persistent symptoms for more than 72 hours.   You have a fever and your symptoms suddenly get worse.   Your leg becomes pale, cool, tingly, or numb.  You have heavy bleeding from the site. Hold pressure on the site. .Marland Kitchen

## 2014-07-05 NOTE — Discharge Summary (Signed)
ELECTROPHYSIOLOGY PROCEDURE DISCHARGE SUMMARY    Patient ID: Carolyn Phillips,  MRN: [id number], DOB/AGE: [birth year] 38 y.o.  Admit date: 07/04/2014 Discharge date: 07/05/2014  Primary Care Physician: Gwynneth Aliment, MD Primary Cardiologist: Katrinka Blazing Electrophysiologist: Ladona Ridgel  Primary Discharge Diagnosis:  WPW status post ablation this admission  Secondary Discharge Diagnosis:  1.  Hypertension 2.  Hyperlipidemia 3.  Diabetes 4.  GERD 5.  Obesity  Allergies  Allergen Reactions  . Penicillins Hives and Swelling    Eye swelling  . Metoprolol     "LOL" Drugs cause hair to fall out  . Other Nausea And Vomiting    General Anesthesia      Procedures This Admission:  1. Electrophysiology study and radiofrequency catheter ablation on 07-04-2014 by Dr Ladona Ridgel.  This study demonstrated successful ablation of AVRT utilizing a right manifest right mid septal accessory pathway.  There were no early apparent complications.   Brief HPI: Carolyn Phillips is a 38 y.o. female with a past medical history as outlined above.  She was diagnosed with WPW at the age of 38.  She has had persistent intermittent palpitations since that time occuring especially with exertion.  She has failed medical therapy with beta blockers.  Risks, benefits, and alternatives to ablation were reviewed with the patient who wished to proceed.   Hospital Course:  The patient was admitted and underwent EPS/RFCA of AVRT with details as outlined above. She was monitored on telemetry overnight which demonstrated sinus rhythm.  Her groin and neck incisions were without complication.  She was evaluated by Dr Ladona Ridgel and considered stable for discharge to home.   Discharge Vitals: Blood pressure 113/69, pulse 77, temperature 97.5 F (36.4 C), temperature source Oral, resp. rate 16, height 5\' 3"  (1.6 m), weight 221 lb 3.2 oz (100.336 kg), SpO2 95.00%.   Labs:   Lab Results  Component Value Date   WBC 9.1  06/28/2014   HGB 12.6 06/28/2014   HCT 39.3 06/28/2014   MCV 72.9* 06/28/2014   PLT 379.0 06/28/2014     Recent Labs Lab 06/28/14 0933  NA 136  K 3.7  CL 103  CO2 26  BUN 12  CREATININE 1.0  CALCIUM 9.1  GLUCOSE 118*     Discharge Medications:    Medication List         ALIGN 4 MG Caps  Take 1 capsule by mouth daily.     Biotin 2500 MCG Caps  Take 2 capsules by mouth daily.     hydrochlorothiazide 25 MG tablet  Commonly known as:  HYDRODIURIL  12.5 mg daily     levofloxacin 500 MG tablet  Commonly known as:  LEVAQUIN  Take 1 tablet by mouth daily.     meclizine 12.5 MG tablet  Commonly known as:  ANTIVERT  Take 12.5 mg by mouth 3 (three) times daily as needed for dizziness.     nebivolol 5 MG tablet  Commonly known as:  BYSTOLIC  Take 5 mg by mouth daily.     omeprazole 20 MG capsule  Commonly known as:  PRILOSEC  Take 20 mg by mouth daily.     sitaGLIPtin-metformin 50-500 MG per tablet  Commonly known as:  JANUMET  Take 1 tablet by mouth 2 (two) times daily with a meal.     VITAMIN B 12 PO  Take 1 tablet by mouth daily.     Vitamin D (Ergocalciferol) 50000 UNITS Caps capsule  Commonly known as:  DRISDOL  Take  50,000 Units by mouth 2 (two) times a week.     Zinc 30 MG Caps  Take 1 capsule by mouth daily.        Disposition:   Follow-up Information   Follow up with Lewayne BuntingGregg Taylor, MD. (08/01/14 at 2pm)    Specialty:  Cardiology   Contact information:   [address] [place] [address] [phone]       Follow up with Gwynneth AlimentSANDERS,ROBYN N, MD. (as scheduled.)    Specialty:  Internal Medicine   Contact information:   [address] [place] [address] [phone]       Duration of Discharge Encounter: Greater than 30 minutes including physician time.  Signed,  Leonia ReevesGregg Taylor,M.D.

## 2014-07-17 ENCOUNTER — Encounter (HOSPITAL_BASED_OUTPATIENT_CLINIC_OR_DEPARTMENT_OTHER): Payer: 59

## 2014-07-25 ENCOUNTER — Telehealth: Payer: Self-pay | Admitting: Internal Medicine

## 2014-07-25 NOTE — Telephone Encounter (Signed)
New message     Pt had an ablation on 07-04-14.  Now she has swelling in her ankles---left worse than right.  Is this normal?

## 2014-07-25 NOTE — Telephone Encounter (Signed)
Left message on machine for patient to return my call in the morning.  Let her know that minimal swelling is normal after an ablation but that I would discuss with Dr Ladona Ridgelaylor tomorrow as he is here

## 2014-07-28 NOTE — Telephone Encounter (Signed)
Left another message for patient to return call.  She does have a follow up appointment next week on 08/01/14 and I left this on the voice mail also

## 2014-08-01 ENCOUNTER — Encounter: Payer: BC Managed Care – PPO | Admitting: Internal Medicine

## 2014-08-02 NOTE — Telephone Encounter (Signed)
Patient had an appointment for post hospital follow up on 11/2 but rescheduled it the day of to 11/19.

## 2014-08-18 ENCOUNTER — Encounter: Payer: BC Managed Care – PPO | Admitting: Internal Medicine

## 2014-09-08 ENCOUNTER — Encounter (HOSPITAL_COMMUNITY): Payer: Self-pay | Admitting: Internal Medicine

## 2014-11-03 ENCOUNTER — Emergency Department (HOSPITAL_COMMUNITY): Payer: BLUE CROSS/BLUE SHIELD

## 2014-11-03 ENCOUNTER — Encounter (HOSPITAL_COMMUNITY): Payer: Self-pay | Admitting: Emergency Medicine

## 2014-11-03 ENCOUNTER — Emergency Department (HOSPITAL_COMMUNITY)
Admission: EM | Admit: 2014-11-03 | Discharge: 2014-11-04 | Disposition: A | Discharge status: Home / Self Care | Payer: BLUE CROSS/BLUE SHIELD | Attending: Emergency Medicine | Admitting: Emergency Medicine

## 2014-11-03 DIAGNOSIS — Z880 Allergy status to penicillin: Secondary | ICD-10-CM | POA: Insufficient documentation

## 2014-11-03 DIAGNOSIS — E119 Type 2 diabetes mellitus without complications: Secondary | ICD-10-CM | POA: Diagnosis not present

## 2014-11-03 DIAGNOSIS — Z87891 Personal history of nicotine dependence: Secondary | ICD-10-CM | POA: Diagnosis not present

## 2014-11-03 DIAGNOSIS — I10 Essential (primary) hypertension: Secondary | ICD-10-CM | POA: Diagnosis not present

## 2014-11-03 DIAGNOSIS — K219 Gastro-esophageal reflux disease without esophagitis: Secondary | ICD-10-CM | POA: Diagnosis not present

## 2014-11-03 DIAGNOSIS — M545 Low back pain: Secondary | ICD-10-CM | POA: Insufficient documentation

## 2014-11-03 DIAGNOSIS — I456 Pre-excitation syndrome: Secondary | ICD-10-CM

## 2014-11-03 DIAGNOSIS — R059 Cough, unspecified: Secondary | ICD-10-CM

## 2014-11-03 DIAGNOSIS — R002 Palpitations: Secondary | ICD-10-CM | POA: Insufficient documentation

## 2014-11-03 DIAGNOSIS — Z79899 Other long term (current) drug therapy: Secondary | ICD-10-CM | POA: Insufficient documentation

## 2014-11-03 DIAGNOSIS — R0789 Other chest pain: Secondary | ICD-10-CM | POA: Diagnosis not present

## 2014-11-03 DIAGNOSIS — R05 Cough: Secondary | ICD-10-CM

## 2014-11-03 DIAGNOSIS — E669 Obesity, unspecified: Secondary | ICD-10-CM | POA: Insufficient documentation

## 2014-11-03 DIAGNOSIS — R079 Chest pain, unspecified: Secondary | ICD-10-CM | POA: Diagnosis present

## 2014-11-03 NOTE — ED Notes (Signed)
Pt. reports persistent dry cough for 1 week, respirations unlabored , denies fever or chills.

## 2014-11-04 LAB — BASIC METABOLIC PANEL
Anion gap: 7 (ref 5–15)
BUN: 10 mg/dL (ref 6–23)
CO2: 27 mmol/L (ref 19–32)
Calcium: 9.1 mg/dL (ref 8.4–10.5)
Chloride: 103 mmol/L (ref 96–112)
Creatinine, Ser: 0.81 mg/dL (ref 0.50–1.10)
GFR calc non Af Amer: 90 mL/min — ABNORMAL LOW (ref >90)
Glucose, Bld: 107 mg/dL — ABNORMAL HIGH (ref 70–99)
Potassium: 3.6 mmol/L (ref 3.5–5.1)
Sodium: 137 mmol/L (ref 135–145)

## 2014-11-04 LAB — CBC
HEMATOCRIT: 38.1 % (ref 36.0–46.0)
Hemoglobin: 12.2 g/dL (ref 12.0–15.0)
MCH: 22.8 pg — ABNORMAL LOW (ref 26.0–34.0)
MCHC: 32 g/dL (ref 30.0–36.0)
MCV: 71.2 fL — AB (ref 78.0–100.0)
Platelets: 314 10*3/uL (ref 150–400)
RBC: 5.35 MIL/uL — AB (ref 3.87–5.11)
RDW: 15.4 % (ref 11.5–15.5)
WBC: 8.8 10*3/uL (ref 4.0–10.5)

## 2014-11-04 LAB — BRAIN NATRIURETIC PEPTIDE: B NATRIURETIC PEPTIDE 5: 11.9 pg/mL (ref 0.0–100.0)

## 2014-11-04 LAB — I-STAT TROPONIN, ED: TROPONIN I, POC: 0 ng/mL (ref 0.00–0.08)

## 2014-11-04 LAB — TROPONIN I

## 2014-11-04 MED ORDER — AEROCHAMBER PLUS W/MASK MISC
1.0000 | Freq: Once | Status: AC
  Administered 2014-11-04: 1
  Filled 2014-11-04: qty 1

## 2014-11-04 MED ORDER — ALBUTEROL SULFATE HFA 108 (90 BASE) MCG/ACT IN AERS
2.0000 | INHALATION_SPRAY | RESPIRATORY_TRACT | Status: DC | PRN
  Administered 2014-11-04: 2 via RESPIRATORY_TRACT
  Filled 2014-11-04: qty 6.7

## 2014-11-04 NOTE — ED Notes (Signed)
Went to room to assess pt.  Pt is not in room at this time.

## 2014-11-04 NOTE — ED Notes (Signed)
The patient maintained 100% oxygen level when she first started walking.  She did drop to 91% but came back up to 92% once we got back in the room. Her heart rate remained at 90bpm.

## 2014-11-04 NOTE — Discharge Instructions (Signed)
1. Medications: Albuterol MDI as needed for shortness of breath, usual home medications 2. Treatment: rest, drink plenty of fluids,  3. Follow Up: Please followup with your primary doctor and cardiologist in 2-3 days for discussion of your diagnoses and further evaluation after today's visit; if you do not have a primary care doctor use the resource guide provided to find one; Please return to the ER for syncopal episodes, return of your palpitations, worsening shortness of breath with exertion or other concerning symptoms

## 2014-11-04 NOTE — ED Provider Notes (Signed)
CSN: [id number]     Arrival date & time 11/03/14  2323 History  This chart was scribed for non-physician practitioner, Dierdre Forth, PA-C, working with Loren Racer, MD, by Bronson Curb, ED Scribe. This patient was seen in room TR04C/TR04C and the patient's care was started at 12:03 AM.    Chief Complaint  Patient presents with  . Chest Pain    The history is provided by the patient and medical records. No language interpreter was used.     HPI Comments: Carolyn Phillips is a 39 y.o. female, HTN, HLD, DM, and GERD,who presents to the Emergency Department per request by her cardiologist for chest pain and SOB that have been ongoing for past 2 days. Patient reports walking for flights of today when she became SOB. Patient notes she routinely does stair exercises without becoming SOB. Patient has history of WPW syndrome and cardiac ablation, (preformed by Dr. Ladona Ridgel in October 2015), approximately 3-4 months ago and is concerned her symptoms are cardiac related. She notes associated unprovoked palpitations, with episodes occuring several times today.  She notes the chest pain is worse with cough and radiates to her back. Patient has tried Robitussin without significant improvement of cough, in addition to Mucinex (with decongestant) for 4 consecutive days. She denies any SOB or palpitations at this time. She further denies fever, chills, sore throat, nausea ,or vomiting.  Past Medical History  Diagnosis Date  . Wolff-Parkinson-White syndrome   . Hypertension   . Hyperlipemia   . Diabetes mellitus without complication   . GERD (gastroesophageal reflux disease)   . Obesity   . Complication of anesthesia   . PONV (postoperative nausea and vomiting)    Past Surgical History  Procedure Laterality Date  . Abdominal hysterectomy    . Bladder repair    . Breast reduction surgery    . Tubal ligation    . Supraventricular tachycardia ablation N/A 07/04/2014    Procedure: WPW  ABLATION;  Surgeon: Marinus Maw, MD;  Location: [facility] CATH LAB;  Service: Cardiovascular;  Laterality: N/A;   Family History  Problem Relation Age of Onset  . Valvular heart disease Mother    History  Substance Use Topics  . Smoking status: Former Games developer  . Smokeless tobacco: Never Used     Comment: only smoked for one year along time ago "  . Alcohol Use: Yes     Comment: occasional   OB History    No data available     Review of Systems  Constitutional: Negative for fever, chills, diaphoresis, appetite change, fatigue and unexpected weight change.  HENT: Negative for mouth sores.   Eyes: Negative for visual disturbance.  Respiratory: Positive for cough and shortness of breath. Negative for chest tightness and wheezing.   Cardiovascular: Positive for chest pain and palpitations.  Gastrointestinal: Negative for nausea, vomiting, abdominal pain, diarrhea and constipation.  Endocrine: Negative for polydipsia, polyphagia and polyuria.  Genitourinary: Negative for dysuria, urgency, frequency and hematuria.  Musculoskeletal: Positive for back pain. Negative for neck stiffness.  Skin: Negative for rash.  Allergic/Immunologic: Negative for immunocompromised state.  Neurological: Negative for syncope, light-headedness and headaches.  Hematological: Does not bruise/bleed easily.  Psychiatric/Behavioral: Negative for sleep disturbance. The patient is not nervous/anxious.       Allergies  Penicillins; Metoprolol; and Other  Home Medications   Prior to Admission medications   Medication Sig Start Date End Date Taking? Authorizing Provider  Biotin 2500 MCG CAPS Take 2 capsules by mouth daily.  Yes Historical Provider, MD  Cholecalciferol (VITAMIN D3) 3000 UNITS TABS Take 3,000 Units by mouth daily.   Yes Historical Provider, MD  Cyanocobalamin (VITAMIN B 12 PO) Take 1 tablet by mouth daily.   Yes Historical Provider, MD  hydrochlorothiazide (HYDRODIURIL) 25 MG tablet 12.5 mg daily    Yes Historical Provider, MD  meclizine (ANTIVERT) 12.5 MG tablet Take 12.5 mg by mouth 3 (three) times daily as needed for dizziness.   Yes Historical Provider, MD  Probiotic Product (ALIGN) 4 MG CAPS Take 1 capsule by mouth daily.    Yes Historical Provider, MD  sitaGLIPtin-metformin (JANUMET) 50-500 MG per tablet Take 1 tablet by mouth 2 (two) times daily with a meal.   Yes Historical Provider, MD  VITAMIN A PO Take 1 tablet by mouth daily.   Yes Historical Provider, MD  Zinc 30 MG CAPS Take 1 capsule by mouth daily.    Yes Historical Provider, MD  levofloxacin (LEVAQUIN) 500 MG tablet Take 1 tablet by mouth daily. 06/30/14   Historical Provider, MD  nebivolol (BYSTOLIC) 5 MG tablet Take 5 mg by mouth daily.    Historical Provider, MD  omeprazole (PRILOSEC) 20 MG capsule Take 20 mg by mouth daily.    Historical Provider, MD  Vitamin D, Ergocalciferol, (DRISDOL) 50000 UNITS CAPS capsule Take 50,000 Units by mouth 2 (two) times a week.    Historical Provider, MD   Triage Vitals: BP 143/73 mmHg  Pulse 85  Temp(Src) 98 F (36.7 C) (Oral)  Resp 16  SpO2 99%  Physical Exam  Constitutional: She is oriented to person, place, and time. She appears well-developed and well-nourished. No distress.  Awake, alert, nontoxic appearance  HENT:  Head: Normocephalic and atraumatic.  Right Ear: Tympanic membrane, external ear and ear canal normal.  Left Ear: Tympanic membrane, external ear and ear canal normal.  Nose: Nose normal. No epistaxis. Right sinus exhibits no maxillary sinus tenderness and no frontal sinus tenderness. Left sinus exhibits no maxillary sinus tenderness and no frontal sinus tenderness.  Mouth/Throat: Uvula is midline, oropharynx is clear and moist and mucous membranes are normal. Mucous membranes are not pale and not cyanotic. No oropharyngeal exudate, posterior oropharyngeal edema, posterior oropharyngeal erythema or tonsillar abscesses.  Eyes: Conjunctivae are normal. Pupils are  equal, round, and reactive to light. No scleral icterus.  Neck: Normal range of motion and full passive range of motion without pain. Neck supple.  Cardiovascular: Normal rate, regular rhythm, normal heart sounds and intact distal pulses.   No murmur heard. Regular rate and rhythm  Pulmonary/Chest: Effort normal. No accessory muscle usage or stridor. No respiratory distress. She has decreased breath sounds. She has no wheezes.  Equal chest expansion Decreased breath sounds in the bilateral lower fields without focal wheezes, rhonchi or rales  Abdominal: Soft. Bowel sounds are normal. She exhibits no distension and no mass. There is no tenderness. There is no rebound and no guarding.  Musculoskeletal: Normal range of motion. She exhibits no edema.  Lymphadenopathy:    She has no cervical adenopathy.  Neurological: She is alert and oriented to person, place, and time. She exhibits normal muscle tone. Coordination normal.  Speech is clear and goal oriented Moves extremities without ataxia  Skin: Skin is warm and dry. No rash noted. She is not diaphoretic. No erythema.  Psychiatric: She has a normal mood and affect.  Nursing note and vitals reviewed.   ED Course  Procedures (including critical care time)  DIAGNOSTIC STUDIES: Oxygen Saturation is 99%  on room air, normal by my interpretation.    COORDINATION OF CARE: At 0012 Discussed treatment plan with patient which includes EKG. Patient agrees.   Labs Review Labs Reviewed  CBC - Abnormal; Notable for the following:    RBC 5.35 (*)    MCV 71.2 (*)    MCH 22.8 (*)    All other components within normal limits  BASIC METABOLIC PANEL - Abnormal; Notable for the following:    Glucose, Bld 107 (*)    GFR calc non Af Amer 90 (*)    All other components within normal limits  TROPONIN I  BRAIN NATRIURETIC PEPTIDE  I-STAT TROPOININ, ED    Imaging Review Dg Chest 2 View  11/04/2014   CLINICAL DATA:  Acute onset of shortness of breath  and cough. Recent heart ablation. Initial encounter.  EXAM: CHEST  2 VIEW  COMPARISON:  Chest radiograph performed 10/09/2013  FINDINGS: The lungs are well-aerated. Mild peribronchial thickening is noted. Mild bilateral atelectasis is seen. There is no evidence of pleural effusion or pneumothorax.  The heart is normal in size; the mediastinal contour is within normal limits. No acute osseous abnormalities are seen.  IMPRESSION: Mild peribronchial thickening noted; mild bilateral atelectasis seen.   Electronically Signed   By: Roanna Raider M.D.   On: 11/04/2014 00:48     EKG Interpretation   Date/Time:  Friday November 04 2014 00:46:01 EST Ventricular Rate:  72 PR Interval:  182 QRS Duration: 79 QT Interval:  402 QTC Calculation: 440 R Axis:   77 Text Interpretation:  Sinus rhythm Non-specific ST-t changes No previous  ECGs available Confirmed by Bebe Shaggy  MD, DONALD ([id number]) on 11/04/2014  12:55:58 AM      MDM   Final diagnoses:  Cough  Essential hypertension  WPW (Wolff-Parkinson-White syndrome)  Chest pressure   Carolyn Phillips presents with complaints of chest heaviness and palpitations onset today. Patient with history of WPW and recent ablation.  Concern for possible cardiac etiology of patient's palpitations. Will obtain EKG, labs and reassess.  2:19 AM Patient labs and EKG reassuring. Labs unremarkable, troponin negative. She ambulates in the department without tachycardia. No risk factors for PE and patient is perc negative.  No return of symptoms here in the emergency department. Patient will be given albuterol inhaler for decreased breath sounds and potential bronchospasm causing today shortness of breath. She is to see her primary care provider within 2 days and her cardiologist early next week. She is to return to emergency department for worsening symptoms or return of her palpitations.  I have personally reviewed patient's vitals, nursing note and any pertinent  labs or imaging.  I performed an undressed physical exam.    It has been determined that no acute conditions requiring further emergency intervention are present at this time. The patient/guardian have been advised of the diagnosis and plan. I reviewed all labs and imaging including any potential incidental findings. We have discussed signs and symptoms that warrant return to the ED and they are listed in the discharge instructions.    Vital signs are stable at discharge.   BP 116/81 mmHg  Pulse 81  Temp(Src) 98.4 F (36.9 C) (Oral)  Resp 15  Ht  (1.6 m)  Wt 207 lb (93.895 kg)  BMI 36.68 kg/m2  SpO2 98%  I personally performed the services described in this documentation, which was scribed in my presence. The recorded information has been reviewed and is accurate.'   Dierdre Forth, PA-C  11/04/14 0221  Loren Racer, MD 11/04/14 [phone]

## 2015-01-26 ENCOUNTER — Other Ambulatory Visit: Payer: Self-pay | Admitting: Nurse Practitioner

## 2015-01-26 DIAGNOSIS — Z01419 Encounter for gynecological examination (general) (routine) without abnormal findings: Secondary | ICD-10-CM

## 2015-01-26 DIAGNOSIS — Z1231 Encounter for screening mammogram for malignant neoplasm of breast: Secondary | ICD-10-CM

## 2015-01-30 ENCOUNTER — Ambulatory Visit
Admission: RE | Admit: 2015-01-30 | Discharge: 2015-01-30 | Disposition: A | Discharge status: Home / Self Care | Payer: BLUE CROSS/BLUE SHIELD | Source: Ambulatory Visit | Attending: Nurse Practitioner | Admitting: Nurse Practitioner

## 2015-01-30 ENCOUNTER — Ambulatory Visit: Payer: BLUE CROSS/BLUE SHIELD

## 2015-01-30 DIAGNOSIS — Z1231 Encounter for screening mammogram for malignant neoplasm of breast: Secondary | ICD-10-CM

## 2015-01-31 ENCOUNTER — Ambulatory Visit
Admission: RE | Admit: 2015-01-31 | Discharge: 2015-01-31 | Disposition: A | Discharge status: Home / Self Care | Payer: BLUE CROSS/BLUE SHIELD | Source: Ambulatory Visit | Attending: Nurse Practitioner | Admitting: Nurse Practitioner

## 2015-01-31 DIAGNOSIS — Z01419 Encounter for gynecological examination (general) (routine) without abnormal findings: Secondary | ICD-10-CM

## 2015-06-26 ENCOUNTER — Telehealth: Payer: Self-pay | Admitting: Internal Medicine

## 2015-06-26 NOTE — Telephone Encounter (Signed)
Patient c/o Palpitations:  High priority if patient c/o lightheadedness and shortness of breath.  1. How long have you been having palpitations? 2days  2. Are you currently experiencing lightheadedness and shortness of breath?no  3. Have you checked your BP and heart rate? (document readings)  150/140  Hr 92 152/93    Hr 78 4. Are you experiencing any other symptoms? No   Pt want to know if she has another one what does she need to do. Pt did sched an appt for 9.29.16 w/ Dr Ladona Ridgel.

## 2015-06-26 NOTE — Telephone Encounter (Signed)
Left message to call back  

## 2015-06-29 ENCOUNTER — Ambulatory Visit (INDEPENDENT_AMBULATORY_CARE_PROVIDER_SITE_OTHER): Payer: BLUE CROSS/BLUE SHIELD | Admitting: Internal Medicine

## 2015-06-29 ENCOUNTER — Encounter: Payer: Self-pay | Admitting: Internal Medicine

## 2015-06-29 VITALS — BP 128/80 | HR 85 | Ht 63.0 in | Wt 214.0 lb

## 2015-06-29 DIAGNOSIS — I10 Essential (primary) hypertension: Secondary | ICD-10-CM | POA: Diagnosis not present

## 2015-06-29 DIAGNOSIS — I456 Pre-excitation syndrome: Secondary | ICD-10-CM | POA: Diagnosis not present

## 2015-06-29 MED ORDER — NEBIVOLOL HCL 5 MG PO TABS: 5.0000 mg | ORAL_TABLET | Freq: Every day | ORAL | Status: DC

## 2015-06-29 NOTE — Patient Instructions (Addendum)
Medication Instructions:  Your physician has recommended you make the following change in your medication:  1) take Bystolic  daily    Labwork: None ordered   Testing/Procedures: None ordered   Follow-Up: Your physician wants you to follow-up in: 12 months with Dr Court Joy will receive a reminder letter in the mail two months in advance. If you don't receive a letter, please call our office to schedule the follow-up appointment.   Any Other Special Instructions Will Be Listed Below (If Applicable).

## 2015-06-29 NOTE — Assessment & Plan Note (Signed)
Her blood pressure is well controlled today. Will follow. 

## 2015-06-29 NOTE — Progress Notes (Signed)
HPI Carolyn Phillips returns today for followup. She is a pleasant 39 yo woman with WPW syndrome who underwent cathter ablation of a manifest right sided AP over a year ago. She experienced palpitations associated with a HA several days ago. She checked her blood pressure and it was in the 140 range and her HR was in the 95-100/min range. She has had no sustained heart racing. She took HCTZ with improvement in her headache and blood pressure. Allergies  Allergen Reactions  . Penicillins Hives and Swelling    Eye swelling  . Metoprolol     "LOL" Drugs cause hair to fall out  . Other Nausea And Vomiting    General Anesthesia      Current Outpatient Prescriptions  Medication Sig Dispense Refill  . Biotin 2500 MCG CAPS Take 2 capsules by mouth daily.    . Cholecalciferol (VITAMIN D3) 3000 UNITS TABS Take 3,000 Units by mouth daily.    . Cyanocobalamin (VITAMIN B 12 PO) Take 1 tablet by mouth daily.    . hydrochlorothiazide (HYDRODIURIL) 25 MG tablet 12.5 mg daily    . meclizine (ANTIVERT) 12.5 MG tablet Take 12.5 mg by mouth 3 (three) times daily as needed for dizziness.    Marland Kitchen omeprazole (PRILOSEC) 20 MG capsule Take 20 mg by mouth as needed.     . Probiotic Product (ALIGN) 4 MG CAPS Take 1 capsule by mouth daily.     . rosuvastatin (CRESTOR) 5 MG tablet Take 5 mg by mouth 3 (three) times a week.    . sitaGLIPtin-metformin (JANUMET) 50-500 MG per tablet Take 1 tablet by mouth 2 (two) times daily with a meal.    . VITAMIN A PO Take 1 tablet by mouth daily.    . Vitamin D, Ergocalciferol, (DRISDOL) 50000 UNITS CAPS capsule Take 50,000 Units by mouth 2 (two) times a week.    . Zinc 30 MG CAPS Take 1 capsule by mouth daily.     . nebivolol (BYSTOLIC) 5 MG tablet Take 1 tablet (5 mg total) by mouth daily. 90 tablet 3   No current facility-administered medications for this visit.     Past Medical History  Diagnosis Date  . Wolff-Parkinson-White syndrome   . Hypertension   .  Hyperlipemia   . Diabetes mellitus without complication   . GERD (gastroesophageal reflux disease)   . Obesity   . Complication of anesthesia   . PONV (postoperative nausea and vomiting)     ROS:   All systems reviewed and negative except as noted in the HPI.   Past Surgical History  Procedure Laterality Date  . Abdominal hysterectomy    . Bladder repair    . Breast reduction surgery    . Tubal ligation    . Supraventricular tachycardia ablation N/A 07/04/2014    Procedure: WPW ABLATION;  Surgeon: Marinus Maw, MD;  Location: [facility] CATH LAB;  Service: Cardiovascular;  Laterality: N/A;     Family History  Problem Relation Age of Onset  . Valvular heart disease Mother      Social History   Social History  . Marital Status: Legally Separated    Spouse Name: N/A  . Number of Children: N/A  . Years of Education: N/A   Occupational History  . Not on file.   Social History Main Topics  . Smoking status: Former Games developer  . Smokeless tobacco: Never Used     Comment: only smoked for one year along time ago "  .  Alcohol Use: Yes     Comment: occasional  . Drug Use: No  . Sexual Activity: Not on file   Other Topics Concern  . Not on file   Social History Narrative     BP 128/80 mmHg  Pulse 85  Ht  (1.6 m)  Wt 214 lb (97.07 kg)  BMI 37.92 kg/m2  SpO2 98%  Physical Exam:  obese appearing 39 yo woman, NAD HEENT: Unremarkable Neck:  6 cm JVD, no thyromegally Back:  No CVA tenderness Lungs:  Clear with no wheezes HEART:  Regular rate rhythm, no murmurs, no rubs, no clicks Abd:  soft, positive bowel sounds, no organomegally, no rebound, no guarding Ext:  2 plus pulses, no edema, no cyanosis, no clubbing Skin:  No rashes no nodules Neuro:  CN II through XII intact, motor grossly intact  EKG NSR with no pre-excitation  Assess/Plan:

## 2015-06-29 NOTE — Telephone Encounter (Signed)
Pt seen 06/29/15

## 2015-06-29 NOTE — Assessment & Plan Note (Signed)
Her ECG demonstrates no evidence of recurrent WPW. She has not had tachycardia. I have offered her bystolic when she has elevated blood pressure and palpitations. I have asked that she record her HR when she feels palpitations.

## 2015-06-30 NOTE — Addendum Note (Signed)
Addended by: Reesa Chew on: 06/30/2015 05:34 PM   Modules accepted: Orders

## 2015-08-20 ENCOUNTER — Emergency Department (HOSPITAL_COMMUNITY)
Admission: EM | Admit: 2015-08-20 | Discharge: 2015-08-20 | Disposition: A | Discharge status: Home / Self Care | Payer: BLUE CROSS/BLUE SHIELD | Attending: Emergency Medicine | Admitting: Emergency Medicine

## 2015-08-20 ENCOUNTER — Encounter (HOSPITAL_COMMUNITY): Payer: Self-pay

## 2015-08-20 DIAGNOSIS — Z880 Allergy status to penicillin: Secondary | ICD-10-CM | POA: Diagnosis not present

## 2015-08-20 DIAGNOSIS — E119 Type 2 diabetes mellitus without complications: Secondary | ICD-10-CM | POA: Insufficient documentation

## 2015-08-20 DIAGNOSIS — I10 Essential (primary) hypertension: Secondary | ICD-10-CM | POA: Diagnosis not present

## 2015-08-20 DIAGNOSIS — S8392XA Sprain of unspecified site of left knee, initial encounter: Secondary | ICD-10-CM

## 2015-08-20 DIAGNOSIS — Z79899 Other long term (current) drug therapy: Secondary | ICD-10-CM | POA: Insufficient documentation

## 2015-08-20 DIAGNOSIS — Y9289 Other specified places as the place of occurrence of the external cause: Secondary | ICD-10-CM | POA: Insufficient documentation

## 2015-08-20 DIAGNOSIS — Z7984 Long term (current) use of oral hypoglycemic drugs: Secondary | ICD-10-CM | POA: Diagnosis not present

## 2015-08-20 DIAGNOSIS — K219 Gastro-esophageal reflux disease without esophagitis: Secondary | ICD-10-CM | POA: Diagnosis not present

## 2015-08-20 DIAGNOSIS — Y9389 Activity, other specified: Secondary | ICD-10-CM | POA: Diagnosis not present

## 2015-08-20 DIAGNOSIS — E785 Hyperlipidemia, unspecified: Secondary | ICD-10-CM | POA: Diagnosis not present

## 2015-08-20 DIAGNOSIS — E669 Obesity, unspecified: Secondary | ICD-10-CM | POA: Diagnosis not present

## 2015-08-20 DIAGNOSIS — S8992XA Unspecified injury of left lower leg, initial encounter: Secondary | ICD-10-CM | POA: Diagnosis present

## 2015-08-20 DIAGNOSIS — Y998 Other external cause status: Secondary | ICD-10-CM | POA: Diagnosis not present

## 2015-08-20 DIAGNOSIS — X501XXA Overexertion from prolonged static or awkward postures, initial encounter: Secondary | ICD-10-CM | POA: Insufficient documentation

## 2015-08-20 DIAGNOSIS — Z862 Personal history of diseases of the blood and blood-forming organs and certain disorders involving the immune mechanism: Secondary | ICD-10-CM | POA: Insufficient documentation

## 2015-08-20 HISTORY — DX: Thalassemia minor: D56.3

## 2015-08-20 NOTE — ED Notes (Signed)
Pt reports left knee pain X2 days. She reports it feels like there is a tight band around her knee. She reports pain with movement and decreased movement. Pt ambulatory to room.

## 2015-08-20 NOTE — ED Provider Notes (Signed)
CSN: [id number]     Arrival date & time 08/20/15  1050 History  By signing my name below, I, Carolyn Phillips, attest that this documentation has been prepared under the direction and in the presence of [employer], PA-C. Electronically Signed: Tanda Phillips, ED Scribe. 08/20/2015. 12:04 PM.  Chief Complaint  Patient presents with  . Knee Pain   The history is provided by the patient. No language interpreter was used.     HPI Comments: Carolyn Phillips is a 39 y.o. female who presents to the Emergency Department complaining of sudden onset, constant, moderate, left knee pain x 1 week. Pt reports she was getting out of bed when she twisted her knee, causing the pain. She states it feels like there is a tight band around her knee. The pain is exacerbated with bearing weight. Pt mentions that the knee did not begin swelling until 3 days ago, prompting her to come to the ED today. She has been taking Aleve with some relief. Denies weakness, numbness, tingling, or any other associated symptoms.   Past Medical History  Diagnosis Date  . Wolff-Parkinson-White syndrome   . Hypertension   . Hyperlipemia   . Diabetes mellitus without complication (HCC)   . GERD (gastroesophageal reflux disease)   . Obesity   . Complication of anesthesia   . PONV (postoperative nausea and vomiting)   . Beta thalassemia trait    Past Surgical History  Procedure Laterality Date  . Abdominal hysterectomy    . Bladder repair    . Breast reduction surgery    . Tubal ligation    . Supraventricular tachycardia ablation N/A 07/04/2014    Procedure: WPW ABLATION;  Surgeon: Marinus Maw, MD;  Location: [facility] CATH LAB;  Service: Cardiovascular;  Laterality: N/A;   Family History  Problem Relation Age of Onset  . Valvular heart disease Mother    Social History  Substance Use Topics  . Smoking status: Never Smoker   . Smokeless tobacco: Never Used     Comment: only smoked for one year along time ago "  .  Alcohol Use: Yes     Comment: occasional   OB History    No data available     Review of Systems  A complete 10 system review of systems was obtained and all systems are negative except as noted in the HPI and PMH.    Allergies  Penicillins; Metoprolol; and Other  Home Medications   Prior to Admission medications   Medication Sig Start Date End Date Taking? Authorizing Provider  Biotin 2500 MCG CAPS Take 2 capsules by mouth daily.    Historical Provider, MD  Cholecalciferol (VITAMIN D3) 3000 UNITS TABS Take 3,000 Units by mouth daily.    Historical Provider, MD  Cyanocobalamin (VITAMIN B 12 PO) Take 1 tablet by mouth daily.    Historical Provider, MD  hydrochlorothiazide (HYDRODIURIL) 25 MG tablet 12.5 mg daily    Historical Provider, MD  meclizine (ANTIVERT) 12.5 MG tablet Take 12.5 mg by mouth 3 (three) times daily as needed for dizziness.    Historical Provider, MD  nebivolol (BYSTOLIC) 5 MG tablet Take 1 tablet (5 mg total) by mouth daily. 06/29/15   Marinus Maw, MD  omeprazole (PRILOSEC) 20 MG capsule Take 20 mg by mouth as needed.     Historical Provider, MD  Probiotic Product (ALIGN) 4 MG CAPS Take 1 capsule by mouth daily.     Historical Provider, MD  rosuvastatin (CRESTOR) 5 MG tablet  Take 5 mg by mouth 3 (three) times a week.    Historical Provider, MD  sitaGLIPtin-metformin (JANUMET) 50-500 MG per tablet Take 1 tablet by mouth 2 (two) times daily with a meal.    Historical Provider, MD  VITAMIN A PO Take 1 tablet by mouth daily.    Historical Provider, MD  Vitamin D, Ergocalciferol, (DRISDOL) 50000 UNITS CAPS capsule Take 50,000 Units by mouth 2 (two) times a week.    Historical Provider, MD  Zinc 30 MG CAPS Take 1 capsule by mouth daily.     Historical Provider, MD   Triage Vitals: BP 111/94 mmHg  Pulse 83  Temp(Src) 98.3 F (36.8 C) (Oral)  Resp 20  Wt 213 lb 4 oz (96.73 kg)  SpO2 99%   Physical Exam  Constitutional: She is oriented to person, place, and time.  She appears well-developed and well-nourished. No distress.  HENT:  Head: Normocephalic and atraumatic.  Mouth/Throat: Oropharynx is clear and moist.  Eyes: Conjunctivae and EOM are normal. Pupils are equal, round, and reactive to light.  Neck: Normal range of motion. Neck supple. No tracheal deviation present.  Cardiovascular: Normal rate, regular rhythm and intact distal pulses.   Pulmonary/Chest: Effort normal and breath sounds normal. No respiratory distress. She has no wheezes. She has no rales. She exhibits no tenderness.  Abdominal: Soft. Bowel sounds are normal. She exhibits no distension and no mass. There is no tenderness. There is no rebound and no guarding.  Musculoskeletal: Normal range of motion. She exhibits no edema or tenderness.  Right knee:  No deformity, erythema or abrasions. FROM. No effusion or crepitance. Anterior and posterior drawer show no abnormal laxity. Stable to valgus and varus stress. Joint lines are non-tender. Neurovascularly intact. Pt ambulates with non-antalgic gait.    Neurological: She is alert and oriented to person, place, and time.  Skin: Skin is warm and dry.  Psychiatric: She has a normal mood and affect. Her behavior is normal.  Nursing note and vitals reviewed.   ED Course  Procedures (including critical care time)  DIAGNOSTIC STUDIES: Oxygen Saturation is 99% on RA, normal by my interpretation.    COORDINATION OF CARE: 12:01 PM-Discussed treatment plan which includes knee immobilizer, crutches, and referral to orthopedist with pt at bedside and pt agreed to plan. Offered pain medication to pt but she reports getting prescription for Amitriptyline 1 month ago for achilles tendonitis of left foot. Pt does not want anymore pain medication and declined offer.    Labs Review Labs Reviewed - No data to display  Imaging Review No results found.   EKG Interpretation None      MDM   Final diagnoses:  Left knee sprain, initial encounter     Filed Vitals:   08/20/15 1107  BP: 111/94  Pulse: 83  Temp: 98.3 F (36.8 C)  TempSrc: Oral  Resp: 20  Weight: 213 lb 4 oz (96.73 kg)  SpO2: 99%    Carolyn Phillips is 39 y.o. female presenting with atraumatic knee pain. Patient is ambulatory. Physical exam with no abnormalities. Excellent range of motion and distally neurovascularly intact. Patient given crutches and advised to follow closely with orthopedics.  Evaluation does not show pathology that would require ongoing emergent intervention or inpatient treatment. Pt is hemodynamically stable and mentating appropriately. Discussed findings and plan with patient/guardian, who agrees with care plan. All questions answered. Return precautions discussed and outpatient follow up given.   I personally performed the services described in this documentation,  which was scribed in my presence. The recorded information has been reviewed and is accurate.      Wynetta Emeryicole Joden Bonsall, PA-C 08/20/15 1630  Arby BarretteMarcy Pfeiffer, MD 08/30/15 1435

## 2015-08-20 NOTE — Discharge Instructions (Signed)
Take acetaminophen (Tylenol) up to 975 mg (this is normally 3 over-the-counter pills) up to 3 times a day. Do not drink alcohol. Make sure your other medications do not contain acetaminophen (Read the labels!)  Keep the knee immobilizer on at all times including when sleeping except when bathing. Do not stop using the knee immobilizer until you are cleared by your orthopedist.  Please follow with your primary care doctor in the next 2 days for a check-up. They must obtain records for further management.   Do not hesitate to return to the Emergency Department for any new, worsening or concerning symptoms.    Knee Sprain A knee sprain is a tear in one of the strong, fibrous tissues that connect the bones (ligaments) in your knee. The severity of the sprain depends on how much of the ligament is torn. The tear can be either partial or complete. CAUSES  Often, sprains are a result of a fall or injury. The force of the impact causes the fibers of your ligament to stretch too much. This excess tension causes the fibers of your ligament to tear. SIGNS AND SYMPTOMS  You may have some loss of motion in your knee. Other symptoms include:  Bruising.  Pain in the knee area.  Tenderness of the knee to the touch.  Swelling. DIAGNOSIS  To diagnose a knee sprain, your health care provider will physically examine your knee. Your health care provider may also suggest an X-ray exam of your knee to make sure no bones are broken. TREATMENT  If your ligament is only partially torn, treatment usually involves keeping the knee in a fixed position (immobilization) or bracing your knee for activities that require movement for several weeks. To do this, your health care provider will apply a bandage, cast, or splint to keep your knee from moving and to support your knee during movement until it heals. For a partially torn ligament, the healing process usually takes 4-6 weeks. If your ligament is completely torn,  depending on which ligament it is, you may need surgery to reconnect the ligament to the bone or reconstruct it. After surgery, a cast or splint may be applied and will need to stay on your knee for 4-6 weeks while your ligament heals. HOME CARE INSTRUCTIONS  Keep your injured knee elevated to decrease swelling.  To ease pain and swelling, apply ice to the injured area:  Put ice in a plastic bag.  Place a towel between your skin and the bag.  Leave the ice on for 20 minutes, 2-3 times a day.  Only take medicine for pain as directed by your health care provider.  Do not leave your knee unprotected until pain and stiffness go away (usually 4-6 weeks).  If you have a cast or splint, do not allow it to get wet. If you have been instructed not to remove it, cover it with a plastic bag when you shower or bathe. Do not swim.  Your health care provider may suggest exercises for you to do during your recovery to prevent or limit permanent weakness and stiffness. SEEK IMMEDIATE MEDICAL CARE IF:  Your cast or splint becomes damaged.  Your pain becomes worse.  You have significant pain, swelling, or numbness below the cast or splint. MAKE SURE YOU:  Understand these instructions.  Will watch your condition.  Will get help right away if you are not doing well or get worse.   This information is not intended to replace advice given to you  by your health care provider. Make sure you discuss any questions you have with your health care provider.   Document Released: 09/16/2005 Document Revised: 10/07/2014 Document Reviewed: 04/28/2013 Elsevier Interactive Patient Education [employer].

## 2015-09-08 ENCOUNTER — Other Ambulatory Visit: Payer: Self-pay | Admitting: Orthopedic Surgery

## 2015-09-11 ENCOUNTER — Encounter (HOSPITAL_BASED_OUTPATIENT_CLINIC_OR_DEPARTMENT_OTHER): Payer: Self-pay | Admitting: *Deleted

## 2015-09-12 NOTE — H&P (Signed)
Carolyn Phillips is an 39 y.o. female.    Chief Complaint: Left knee pain  HPI: Patient presents with a chief complaint of left knee pain that began when she got of bed. Her knee popped, twisted and buckled on 08/18/2015.  She's had use crutches for ambulation and has trouble bearing weight on the leg since then.  There has been associated swelling.  The pain is constant, severe sharp, throbbing and aching with occasional burning quality.  It wakes her from sleep and is unchanged since it began.  Any attempts at bending makes the pain worse.  Aleve does not help; a knee immobilizer has helped some.  Past Medical History  Diagnosis Date  . Wolff-Parkinson-White syndrome   . Hypertension   . Hyperlipemia   . Diabetes mellitus without complication (HCC)   . GERD (gastroesophageal reflux disease)   . Obesity   . Complication of anesthesia   . PONV (postoperative nausea and vomiting)   . Beta thalassemia trait   . Dysrhythmia     WPW-ablation  . Headache     Past Surgical History  Procedure Laterality Date  . Abdominal hysterectomy    . Bladder repair    . Breast reduction surgery    . Tubal ligation    . Supraventricular tachycardia ablation N/A 07/04/2014    Procedure: WPW ABLATION;  Surgeon: Marinus Maw, MD;  Location: [facility] CATH LAB;  Service: Cardiovascular;  Laterality: N/A;    Family History  Problem Relation Age of Onset  . Valvular heart disease Mother    Social History:  reports that she has never smoked. She has never used smokeless tobacco. She reports that she drinks alcohol. She reports that she does not use illicit drugs.  Allergies:  Allergies  Allergen Reactions  . Penicillins Hives and Swelling    Eye swelling  . Metoprolol     "LOL" Drugs cause hair to fall out  . Other Nausea And Vomiting    General Anesthesia     No prescriptions prior to admission    No results found for this or any previous visit (from the past 48 hour(s)). No results  found.  Review of Systems  Constitutional: Positive for diaphoresis.  HENT: Positive for tinnitus.   Eyes: Positive for blurred vision.  Respiratory: Negative.   Cardiovascular: Positive for palpitations and leg swelling.       Htn  Gastrointestinal: Positive for heartburn.  Genitourinary:       Poor bladder control  Musculoskeletal: Positive for myalgias and joint pain.  Skin: Negative.   Neurological: Positive for headaches.  Endo/Heme/Allergies:       Blood sugar problem  Psychiatric/Behavioral: Negative.     Height  (1.6 m), weight 96.616 kg (213 lb). Physical Exam  Constitutional: She is oriented to person, place, and time. She appears well-developed and well-nourished.  HENT:  Head: Normocephalic and atraumatic.  Eyes: Pupils are equal, round, and reactive to light.  Neck: Normal range of motion. Neck supple.  Cardiovascular: Intact distal pulses.   Respiratory: Effort normal.  Musculoskeletal: She exhibits tenderness.  she has a mild effusion.  She has a range from 0 to approximately 90.  Her calves are soft and nontender.  She is neurovascularly intact distally.  Neurological: She is alert and oriented to person, place, and time.  Skin: Skin is warm and dry.  Psychiatric: She has a normal mood and affect. Her behavior is normal. Judgment and thought content normal.     Assessment/Plan  Assess: Chondromalacia patella and medial femoral condyle  Plan: Treatment options are discussed with the patient.  Today, she wishes to discuss physical therapy.  She is instructed on 150 straight leg raises a day.  She is also told that should it may be beneficial for her to attend one session of physical therapy for a home exercise program.  We also discussed surgery to include knee arthroscopy.  Today, the patient wishes to discuss this with her husband before scheduling.  .  She may also benefit from a knee sleeve with patella hole.  Call with any issues.  Cassie Shedlock  R 09/12/2015, 12:54 PM

## 2015-09-13 ENCOUNTER — Encounter (HOSPITAL_BASED_OUTPATIENT_CLINIC_OR_DEPARTMENT_OTHER)
Admission: RE | Disposition: A | Discharge status: Home / Self Care | Payer: Self-pay | Source: Ambulatory Visit | Attending: Orthopedic Surgery

## 2015-09-13 ENCOUNTER — Ambulatory Visit (HOSPITAL_BASED_OUTPATIENT_CLINIC_OR_DEPARTMENT_OTHER)
Admission: RE | Admit: 2015-09-13 | Discharge: 2015-09-13 | Disposition: A | Discharge status: Home / Self Care | Payer: BLUE CROSS/BLUE SHIELD | Source: Ambulatory Visit | Attending: Orthopedic Surgery | Admitting: Orthopedic Surgery

## 2015-09-13 ENCOUNTER — Ambulatory Visit (HOSPITAL_BASED_OUTPATIENT_CLINIC_OR_DEPARTMENT_OTHER): Payer: BLUE CROSS/BLUE SHIELD | Admitting: Anesthesiology

## 2015-09-13 ENCOUNTER — Encounter (HOSPITAL_BASED_OUTPATIENT_CLINIC_OR_DEPARTMENT_OTHER): Payer: Self-pay | Admitting: *Deleted

## 2015-09-13 DIAGNOSIS — E785 Hyperlipidemia, unspecified: Secondary | ICD-10-CM | POA: Insufficient documentation

## 2015-09-13 DIAGNOSIS — M2242 Chondromalacia patellae, left knee: Secondary | ICD-10-CM | POA: Diagnosis present

## 2015-09-13 DIAGNOSIS — I456 Pre-excitation syndrome: Secondary | ICD-10-CM | POA: Insufficient documentation

## 2015-09-13 DIAGNOSIS — Z794 Long term (current) use of insulin: Secondary | ICD-10-CM | POA: Diagnosis not present

## 2015-09-13 DIAGNOSIS — K219 Gastro-esophageal reflux disease without esophagitis: Secondary | ICD-10-CM | POA: Diagnosis not present

## 2015-09-13 DIAGNOSIS — Z79899 Other long term (current) drug therapy: Secondary | ICD-10-CM | POA: Insufficient documentation

## 2015-09-13 DIAGNOSIS — E119 Type 2 diabetes mellitus without complications: Secondary | ICD-10-CM | POA: Diagnosis not present

## 2015-09-13 DIAGNOSIS — E669 Obesity, unspecified: Secondary | ICD-10-CM | POA: Diagnosis not present

## 2015-09-13 DIAGNOSIS — M94262 Chondromalacia, left knee: Secondary | ICD-10-CM

## 2015-09-13 DIAGNOSIS — I10 Essential (primary) hypertension: Secondary | ICD-10-CM | POA: Diagnosis not present

## 2015-09-13 HISTORY — DX: Headache, unspecified: R51.9

## 2015-09-13 HISTORY — PX: CHONDROPLASTY: SHX5177

## 2015-09-13 HISTORY — PX: KNEE ARTHROSCOPY WITH DRILLING/MICROFRACTURE: SHX6425

## 2015-09-13 HISTORY — DX: Headache: R51

## 2015-09-13 HISTORY — DX: Cardiac arrhythmia, unspecified: I49.9

## 2015-09-13 LAB — GLUCOSE, CAPILLARY
GLUCOSE-CAPILLARY: 118 mg/dL — AB (ref 65–99)
GLUCOSE-CAPILLARY: 116 mg/dL — AB (ref 65–99)

## 2015-09-13 SURGERY — ARTHROSCOPY, KNEE, WITH ABRASION ARTHROPLASTY OR MICROFRACTURE
Anesthesia: General | Site: Knee | Laterality: Left

## 2015-09-13 MED ORDER — SCOPOLAMINE 1 MG/3DAYS TD PT72
1.0000 | MEDICATED_PATCH | Freq: Once | TRANSDERMAL | Status: DC
  Administered 2015-09-13: 1.5 mg via TRANSDERMAL

## 2015-09-13 MED ORDER — FENTANYL CITRATE (PF) 100 MCG/2ML IJ SOLN
50.0000 ug | INTRAMUSCULAR | Status: AC | PRN
Start: 2015-09-13 — End: 2015-09-13
  Administered 2015-09-13: 50 ug via INTRAVENOUS
  Administered 2015-09-13 (×3): 25 ug via INTRAVENOUS
  Administered 2015-09-13: 50 ug via INTRAVENOUS
  Administered 2015-09-13: 25 ug via INTRAVENOUS

## 2015-09-13 MED ORDER — FENTANYL CITRATE (PF) 100 MCG/2ML IJ SOLN
INTRAMUSCULAR | Status: AC
  Filled 2015-09-13: qty 2

## 2015-09-13 MED ORDER — CHLORHEXIDINE GLUCONATE 4 % EX LIQD: 60.0000 mL | Freq: Once | CUTANEOUS | Status: DC

## 2015-09-13 MED ORDER — BUPIVACAINE-EPINEPHRINE (PF) 0.5% -1:200000 IJ SOLN
INTRAMUSCULAR | Status: AC
  Filled 2015-09-13: qty 30

## 2015-09-13 MED ORDER — LIDOCAINE HCL (CARDIAC) 20 MG/ML IV SOLN
INTRAVENOUS | Status: AC
  Filled 2015-09-13: qty 5

## 2015-09-13 MED ORDER — SODIUM CHLORIDE 0.9 % IJ SOLN
INTRAMUSCULAR | Status: AC
  Filled 2015-09-13: qty 10

## 2015-09-13 MED ORDER — VANCOMYCIN HCL IN DEXTROSE 1-5 GM/200ML-% IV SOLN
INTRAVENOUS | Status: AC
  Filled 2015-09-13: qty 200

## 2015-09-13 MED ORDER — PROMETHAZINE HCL 25 MG/ML IJ SOLN
INTRAMUSCULAR | Status: AC
  Filled 2015-09-13: qty 1

## 2015-09-13 MED ORDER — MEPERIDINE HCL 25 MG/ML IJ SOLN: 6.2500 mg | INTRAMUSCULAR | Status: DC | PRN

## 2015-09-13 MED ORDER — SODIUM CHLORIDE 0.9 % IR SOLN
Status: DC | PRN
  Administered 2015-09-13: 3000 mL

## 2015-09-13 MED ORDER — MIDAZOLAM HCL 2 MG/2ML IJ SOLN
1.0000 mg | INTRAMUSCULAR | Status: DC | PRN
  Administered 2015-09-13: 2 mg via INTRAVENOUS

## 2015-09-13 MED ORDER — DEXAMETHASONE SODIUM PHOSPHATE 4 MG/ML IJ SOLN
INTRAMUSCULAR | Status: DC | PRN
  Administered 2015-09-13: 4 mg via INTRAVENOUS

## 2015-09-13 MED ORDER — PROPOFOL 10 MG/ML IV BOLUS
INTRAVENOUS | Status: DC | PRN
  Administered 2015-09-13: 200 mg via INTRAVENOUS

## 2015-09-13 MED ORDER — ONDANSETRON HCL 4 MG/2ML IJ SOLN
INTRAMUSCULAR | Status: DC | PRN
  Administered 2015-09-13: 4 mg via INTRAVENOUS

## 2015-09-13 MED ORDER — DEXTROSE-NACL 5-0.45 % IV SOLN: INTRAVENOUS | Status: DC

## 2015-09-13 MED ORDER — HYDROMORPHONE HCL 1 MG/ML IJ SOLN
0.2500 mg | INTRAMUSCULAR | Status: DC | PRN
  Administered 2015-09-13 (×2): 0.5 mg via INTRAVENOUS

## 2015-09-13 MED ORDER — PROPOFOL 10 MG/ML IV BOLUS
INTRAVENOUS | Status: AC
Start: 2015-09-13 — End: 2015-09-13
  Filled 2015-09-13: qty 20

## 2015-09-13 MED ORDER — EPINEPHRINE HCL 1 MG/ML IJ SOLN
INTRAMUSCULAR | Status: DC | PRN
  Administered 2015-09-13: 1 mg

## 2015-09-13 MED ORDER — SCOPOLAMINE 1 MG/3DAYS TD PT72
MEDICATED_PATCH | TRANSDERMAL | Status: AC
  Filled 2015-09-13: qty 1

## 2015-09-13 MED ORDER — GLYCOPYRROLATE 0.2 MG/ML IJ SOLN: 0.2000 mg | Freq: Once | INTRAMUSCULAR | Status: DC | PRN

## 2015-09-13 MED ORDER — MIDAZOLAM HCL 2 MG/2ML IJ SOLN
INTRAMUSCULAR | Status: AC
  Filled 2015-09-13: qty 2

## 2015-09-13 MED ORDER — VANCOMYCIN HCL IN DEXTROSE 1-5 GM/200ML-% IV SOLN
1000.0000 mg | INTRAVENOUS | Status: AC
  Administered 2015-09-13: 1000 mg via INTRAVENOUS

## 2015-09-13 MED ORDER — ONDANSETRON HCL 4 MG/2ML IJ SOLN
INTRAMUSCULAR | Status: AC
  Filled 2015-09-13: qty 2

## 2015-09-13 MED ORDER — LACTATED RINGERS IV SOLN
INTRAVENOUS | Status: DC
  Administered 2015-09-13 (×2): via INTRAVENOUS

## 2015-09-13 MED ORDER — PROMETHAZINE HCL 25 MG/ML IJ SOLN
INTRAMUSCULAR | Status: DC | PRN
  Administered 2015-09-13: 6.25 mg via INTRAVENOUS

## 2015-09-13 MED ORDER — ONDANSETRON HCL 4 MG/2ML IJ SOLN: 4.0000 mg | Freq: Once | INTRAMUSCULAR | Status: DC | PRN

## 2015-09-13 MED ORDER — LIDOCAINE HCL (CARDIAC) 20 MG/ML IV SOLN
INTRAVENOUS | Status: DC | PRN
  Administered 2015-09-13: 60 mg via INTRAVENOUS

## 2015-09-13 MED ORDER — DEXAMETHASONE SODIUM PHOSPHATE 10 MG/ML IJ SOLN
INTRAMUSCULAR | Status: AC
  Filled 2015-09-13: qty 1

## 2015-09-13 MED ORDER — HYDROCODONE-ACETAMINOPHEN 5-325 MG PO TABS: 1.0000 | ORAL_TABLET | Freq: Four times a day (QID) | ORAL | Status: DC | PRN

## 2015-09-13 MED ORDER — HYDROMORPHONE HCL 1 MG/ML IJ SOLN
INTRAMUSCULAR | Status: AC
  Filled 2015-09-13: qty 1

## 2015-09-13 SURGICAL SUPPLY — 41 items
BANDAGE ELASTIC 6 VELCRO ST LF (GAUZE/BANDAGES/DRESSINGS) ×2 IMPLANT
BLADE 4.2CUDA (BLADE) IMPLANT
BLADE CUTTER GATOR 3.5 (BLADE) ×2 IMPLANT
BLADE GREAT WHITE 4.2 (BLADE) IMPLANT
BNDG COHESIVE 6X5 TAN STRL LF (GAUZE/BANDAGES/DRESSINGS) ×2 IMPLANT
DRAPE ARTHROSCOPY W/POUCH 114 (DRAPES) ×2 IMPLANT
DURAPREP 26ML APPLICATOR (WOUND CARE) ×2 IMPLANT
ELECT MENISCUS 165MM 90D (ELECTRODE) IMPLANT
ELECT REM PT RETURN 9FT ADLT (ELECTROSURGICAL)
ELECTRODE REM PT RTRN 9FT ADLT (ELECTROSURGICAL) IMPLANT
GAUZE SPONGE 4X4 12PLY STRL (GAUZE/BANDAGES/DRESSINGS) ×2 IMPLANT
GAUZE XEROFORM 1X8 LF (GAUZE/BANDAGES/DRESSINGS) ×2 IMPLANT
GLOVE BIO SURGEON STRL SZ 6.5 (GLOVE) ×2 IMPLANT
GLOVE BIO SURGEON STRL SZ7.5 (GLOVE) ×2 IMPLANT
GLOVE BIO SURGEON STRL SZ8.5 (GLOVE) ×2 IMPLANT
GLOVE BIOGEL PI IND STRL 7.0 (GLOVE) ×1 IMPLANT
GLOVE BIOGEL PI IND STRL 8 (GLOVE) ×1 IMPLANT
GLOVE BIOGEL PI IND STRL 9 (GLOVE) ×1 IMPLANT
GLOVE BIOGEL PI INDICATOR 7.0 (GLOVE) ×1
GLOVE BIOGEL PI INDICATOR 8 (GLOVE) ×1
GLOVE BIOGEL PI INDICATOR 9 (GLOVE) ×1
GOWN STRL REUS W/ TWL LRG LVL3 (GOWN DISPOSABLE) ×2 IMPLANT
GOWN STRL REUS W/TWL LRG LVL3 (GOWN DISPOSABLE) ×2
GOWN STRL REUS W/TWL XL LVL3 (GOWN DISPOSABLE) ×2 IMPLANT
IV NS IRRIG 3000ML ARTHROMATIC (IV SOLUTION) ×2 IMPLANT
KNEE WRAP E Z 3 GEL PACK (MISCELLANEOUS) ×2 IMPLANT
MANIFOLD NEPTUNE II (INSTRUMENTS) IMPLANT
NDL SAFETY ECLIPSE 18X1.5 (NEEDLE) ×1 IMPLANT
NEEDLE HYPO 18GX1.5 SHARP (NEEDLE) ×1
PACK ARTHROSCOPY DSU (CUSTOM PROCEDURE TRAY) ×2 IMPLANT
PACK BASIN DAY SURGERY FS (CUSTOM PROCEDURE TRAY) ×2 IMPLANT
PAD ALCOHOL SWAB (MISCELLANEOUS) ×2 IMPLANT
PENCIL BUTTON HOLSTER BLD 10FT (ELECTRODE) IMPLANT
SET ARTHROSCOPY TUBING (MISCELLANEOUS) ×1
SET ARTHROSCOPY TUBING LN (MISCELLANEOUS) ×1 IMPLANT
SLEEVE SCD COMPRESS KNEE MED (MISCELLANEOUS) IMPLANT
SYR 3ML 18GX1 1/2 (SYRINGE) IMPLANT
SYR 5ML LL (SYRINGE) ×2 IMPLANT
TOWEL OR 17X24 6PK STRL BLUE (TOWEL DISPOSABLE) ×2 IMPLANT
WAND STAR VAC 90 (SURGICAL WAND) IMPLANT
WATER STERILE IRR 1000ML POUR (IV SOLUTION) ×2 IMPLANT

## 2015-09-13 NOTE — Interval H&P Note (Signed)
History and Physical Interval Note:  09/13/2015 11:35 AM  Carolyn Phillips  has presented today for surgery, with the diagnosis of left knee loose body and chondromalcia  The various methods of treatment have been discussed with the patient and family. After consideration of risks, benefits and other options for treatment, the patient has consented to  Procedure(s): ARTHROSCOPY LEFT KNEE (Left) as a surgical intervention .  The patient's history has been reviewed, patient examined, no change in status, stable for surgery.  I have reviewed the patient's chart and labs.  Questions were answered to the patient's satisfaction.     Nestor LewandowskyOWAN,Jamie Hafford J

## 2015-09-13 NOTE — Anesthesia Postprocedure Evaluation (Signed)
Anesthesia Post Note  Patient: ComptrollerTrevette Leann Zook  Procedure(s) Performed: Procedure(s) (LRB): KNEE ARTHROSCOPY WITH DRILLING/MICROFRACTURE (Left) CHONDROPLASTY (Left)  Patient location during evaluation: PACU Anesthesia Type: General Level of consciousness: awake and alert Pain management: pain level controlled Vital Signs Assessment: post-procedure vital signs reviewed and stable Respiratory status: spontaneous breathing, nonlabored ventilation, respiratory function stable and patient connected to nasal cannula oxygen Cardiovascular status: blood pressure returned to baseline and stable Postop Assessment: no signs of nausea or vomiting Anesthetic complications: no    Last Vitals:  Filed Vitals:   09/13/15 1330 09/13/15 1345  BP: 152/95 147/96  Pulse: 89 90  Temp:    Resp: 12 16    Last Pain:  Filed Vitals:   09/13/15 1346  PainSc: 7                  Teal Bontrager DAVID

## 2015-09-13 NOTE — Anesthesia Preprocedure Evaluation (Signed)
Anesthesia Evaluation  Patient identified by MRN, date of birth, ID band Patient awake    Reviewed: Allergy & Precautions, NPO status , Patient's Chart, lab work & pertinent test results  History of Anesthesia Complications (+) PONV  Airway Mallampati: I  TM Distance: >3 FB Neck ROM: Full    Dental   Pulmonary    Pulmonary exam normal        Cardiovascular hypertension, Pt. on medications Normal cardiovascular exam+ dysrhythmias   H/O WPW   Neuro/Psych    GI/Hepatic GERD  Medicated and Controlled,  Endo/Other  diabetes, Type 2, Insulin Dependent  Renal/GU      Musculoskeletal   Abdominal   Peds  Hematology   Anesthesia Other Findings   Reproductive/Obstetrics                             Anesthesia Physical Anesthesia Plan  ASA: II  Anesthesia Plan: General   Post-op Pain Management:    Induction: Intravenous  Airway Management Planned: LMA  Additional Equipment:   Intra-op Plan:   Post-operative Plan: Extubation in OR  Informed Consent: I have reviewed the patients History and Physical, chart, labs and discussed the procedure including the risks, benefits and alternatives for the proposed anesthesia with the patient or authorized representative who has indicated his/her understanding and acceptance.     Plan Discussed with: CRNA and Surgeon  Anesthesia Plan Comments:         Anesthesia Quick Evaluation

## 2015-09-13 NOTE — Anesthesia Procedure Notes (Signed)
Procedure Name: LMA Insertion Date/Time: 09/13/2015 11:51 AM Performed by: Curly ShoresRAFT, Ahmyah Gidley W Pre-anesthesia Checklist: Patient identified, Emergency Drugs available, Suction available and Patient being monitored Patient Re-evaluated:Patient Re-evaluated prior to inductionOxygen Delivery Method: Circle System Utilized Preoxygenation: Pre-oxygenation with 100% oxygen Intubation Type: IV induction Ventilation: Mask ventilation without difficulty LMA: LMA inserted LMA Size: 4.0 Number of attempts: 1 Airway Equipment and Method: Bite block Placement Confirmation: positive ETCO2 Tube secured with: Tape Dental Injury: Teeth and Oropharynx as per pre-operative assessment

## 2015-09-13 NOTE — Discharge Instructions (Signed)

## 2015-09-13 NOTE — Transfer of Care (Signed)
Immediate Anesthesia Transfer of Care Note  Patient: Carolyn Phillips  Procedure(s) Performed: Procedure(s): KNEE ARTHROSCOPY WITH DRILLING/MICROFRACTURE (Left) CHONDROPLASTY (Left)  Patient Location: PACU  Anesthesia Type:General  Level of Consciousness: awake, alert , oriented and patient cooperative  Airway & Oxygen Therapy: Patient Spontanous Breathing and Patient connected to face mask oxygen  Post-op Assessment: Report given to RN, Post -op Vital signs reviewed and stable and Patient moving all extremities  Post vital signs: Reviewed and stable  Last Vitals:  Filed Vitals:   09/13/15 1233 09/13/15 1234  BP: 151/88   Pulse: 106 103  Temp:  36.7 C  Resp:  15    Complications: No apparent anesthesia complications

## 2015-09-13 NOTE — Op Note (Signed)
Pre-Op Dx: Left knee chondromalacia patella, medial femoral condyle, lateral tibial plateau  Postop Dx: Same  Procedure: Left knee arthroscopic removal of flap tears from the cartilage of the patella medial femoral condyle lateral tibial plateau microfracture lateral portal tibial plateau  Surgeon: Feliberto GottronFrank J. Turner Danielsowan M.D.  Assist: Tomi LikensEric K. Gaylene BrooksPhillips PA-C  (present throughout entire procedure and necessary for timely completion of the procedure) Anes: General LMA  EBL: Minimal  Fluids: 800 cc   Indications: Catching popping and pain in the left knee. Pt has failed conservative treatment with anti-inflammatory medicines, physical therapy, and modified activites but did get good temporarily from an intra-articular cortisone injection. MRI scan shows chondromalacia patella medial femoral condyle lateral tibial plateau with flap tears. Pain has recurred and patient desires elective arthroscopic evaluation and treatment of knee. Risks and benefits of surgery have been discussed and questions answered.  Procedure: Patient identified by arm band and taken to the operating room at the day surgery Center. The appropriate anesthetic monitors were attached, and General LMA anesthesia was induced without difficulty. Lateral post was applied to the table and the lower extremity was prepped and draped in usual sterile fashion from the ankle to the midthigh. Time out procedure was performed. We began the operation by making standard inferior lateral and inferior medial peripatellar portals with a #11 blade allowing introduction of the arthroscope through the inferior lateral portal and the out flow to the inferior medial portal. Pump pressure was set at 100 mmHg and diagnostic arthroscopy  revealed grade 3 chondromalacia of the patella debrided back to a stable margin with a 3.5 mm Gator sucker shaver alternating portals. Moving into the medial compartment grade 2-3 chondromalacia medial femoral condyle was identified and  likewise debrided back to a stable margin. The asymmetry cell are intact. On the lateral side the patient had large full-thickness flap tears a lateral tibial plateau and after debridement there was a 10 mm diameter full-thickness defect in the cartilage that was then trephined with a microfracture using the small picks. The knee was irrigated out normal saline solution. A dressing of xerofoam 4 x 4 dressing sponges, web roll and an Ace wrap was applied. The patient was awakened extubated and taken to the recovery without difficulty.    Signed: Nestor LewandowskyFrank J Wynelle Dreier, MD

## 2015-09-14 ENCOUNTER — Encounter (HOSPITAL_BASED_OUTPATIENT_CLINIC_OR_DEPARTMENT_OTHER): Payer: Self-pay | Admitting: Orthopedic Surgery

## 2015-09-19 ENCOUNTER — Encounter (HOSPITAL_COMMUNITY): Payer: Self-pay

## 2015-09-19 ENCOUNTER — Emergency Department (HOSPITAL_COMMUNITY)
Admission: EM | Admit: 2015-09-19 | Discharge: 2015-09-19 | Disposition: A | Discharge status: Home / Self Care | Payer: BLUE CROSS/BLUE SHIELD | Attending: Emergency Medicine | Admitting: Emergency Medicine

## 2015-09-19 DIAGNOSIS — R1110 Vomiting, unspecified: Secondary | ICD-10-CM | POA: Diagnosis present

## 2015-09-19 DIAGNOSIS — E785 Hyperlipidemia, unspecified: Secondary | ICD-10-CM | POA: Diagnosis not present

## 2015-09-19 DIAGNOSIS — R42 Dizziness and giddiness: Secondary | ICD-10-CM | POA: Diagnosis not present

## 2015-09-19 DIAGNOSIS — I10 Essential (primary) hypertension: Secondary | ICD-10-CM | POA: Insufficient documentation

## 2015-09-19 DIAGNOSIS — E119 Type 2 diabetes mellitus without complications: Secondary | ICD-10-CM | POA: Insufficient documentation

## 2015-09-19 DIAGNOSIS — Z79899 Other long term (current) drug therapy: Secondary | ICD-10-CM | POA: Insufficient documentation

## 2015-09-19 DIAGNOSIS — E669 Obesity, unspecified: Secondary | ICD-10-CM | POA: Insufficient documentation

## 2015-09-19 DIAGNOSIS — K219 Gastro-esophageal reflux disease without esophagitis: Secondary | ICD-10-CM | POA: Diagnosis not present

## 2015-09-19 DIAGNOSIS — Z880 Allergy status to penicillin: Secondary | ICD-10-CM | POA: Diagnosis not present

## 2015-09-19 MED ORDER — SODIUM CHLORIDE 0.9 % IV BOLUS (SEPSIS)
1000.0000 mL | Freq: Once | INTRAVENOUS | Status: AC
  Administered 2015-09-19: 1000 mL via INTRAVENOUS

## 2015-09-19 MED ORDER — DIAZEPAM 5 MG/ML IJ SOLN
5.0000 mg | Freq: Once | INTRAMUSCULAR | Status: AC
  Administered 2015-09-19: 5 mg via INTRAVENOUS
  Filled 2015-09-19: qty 2

## 2015-09-19 MED ORDER — DIAZEPAM 5 MG PO TABS: 5.0000 mg | ORAL_TABLET | Freq: Two times a day (BID) | ORAL | Status: DC | PRN

## 2015-09-19 MED ORDER — MECLIZINE HCL 25 MG PO TABS: 25.0000 mg | ORAL_TABLET | Freq: Three times a day (TID) | ORAL | Status: DC | PRN

## 2015-09-19 MED ORDER — ONDANSETRON 4 MG PO TBDP: 4.0000 mg | ORAL_TABLET | Freq: Three times a day (TID) | ORAL | Status: DC | PRN

## 2015-09-19 NOTE — Discharge Instructions (Signed)

## 2015-09-19 NOTE — ED Notes (Signed)
Pt reports n/v from vertigo that began aprox. 2 hrs ago.  Pt has hx of vertigo and reports her last episode of vertigo was 8 yrs ago.

## 2015-09-19 NOTE — ED Provider Notes (Signed)
CSN: [id number]     Arrival date & time 09/19/15  [id number] History   First MD Initiated Contact with Patient 09/19/15 0725     Chief Complaint  Patient presents with  . Emesis     (Consider location/radiation/quality/duration/timing/severity/associated sxs/prior Treatment) Patient is a 39 y.o. female presenting with vomiting.  Emesis   The pt is a 39 y/o female with hx of Vertigo - who has been having ongoing vertigo for the last 3 hours prior to arrival since trying to get out of bed to urinate - when she rolled over in bed it came on acutely.  It is worse with moving her head, better when she hold still, associated with n/v - it is similar to what she experienced in the past - last episode was 10 years ago.  Sx are mild at this time when she is laying on her L side - denies other neuro sx including weakness, numbness or headache.  She has WPW but was ablated in the past - she has had no trouble since then.  She has no CP, palpitations or SOB.  Past Medical History  Diagnosis Date  . Wolff-Parkinson-White syndrome   . Hypertension   . Hyperlipemia   . Diabetes mellitus without complication (HCC)   . GERD (gastroesophageal reflux disease)   . Obesity   . Complication of anesthesia   . PONV (postoperative nausea and vomiting)   . Beta thalassemia trait   . Dysrhythmia     WPW-ablation  . Headache    Past Surgical History  Procedure Laterality Date  . Abdominal hysterectomy    . Bladder repair    . Breast reduction surgery    . Tubal ligation    . Supraventricular tachycardia ablation N/A 07/04/2014    Procedure: WPW ABLATION;  Surgeon: Marinus MawGregg W Taylor, MD;  [facility];  Service: Cardiovascular;  Laterality: N/A;  . Knee arthroscopy with drilling/microfracture Left 09/13/2015    Procedure: KNEE ARTHROSCOPY WITH DRILLING/MICROFRACTURE;  Surgeon: Gean BirchwoodFrank Rowan, MD;  Location: [facility];  Service: Orthopedics;  Laterality: Left;  . Chondroplasty Left 09/13/2015     Procedure: CHONDROPLASTY;  Surgeon: Gean BirchwoodFrank Rowan, MD;  Location: [facility];  Service: Orthopedics;  Laterality: Left;   Family History  Problem Relation Age of Onset  . Valvular heart disease Mother    Social History  Substance Use Topics  . Smoking status: Never Smoker   . Smokeless tobacco: Never Used     Comment: only smoked for one year along time ago "  . Alcohol Use: Yes     Comment: occasional   OB History    No data available     Review of Systems  Gastrointestinal: Positive for vomiting.  All other systems reviewed and are negative.     Allergies  Penicillins; Metoprolol; and Other  Home Medications   Prior to Admission medications   Medication Sig Start Date End Date Taking? Authorizing Provider  Biotin 2500 MCG CAPS Take 2 capsules by mouth daily.   Yes Historical Provider, MD  Cholecalciferol (VITAMIN D3) 3000 UNITS TABS Take 3,000 Units by mouth daily.   Yes Historical Provider, MD  Cyanocobalamin (VITAMIN B 12 PO) Take 1 tablet by mouth daily.   Yes Historical Provider, MD  hydrochlorothiazide (HYDRODIURIL) 25 MG tablet Take 25 mg by mouth daily.    Yes Historical Provider, MD  HYDROcodone-acetaminophen (NORCO) 5-325 MG tablet Take 1 tablet by mouth every 6 (six) hours as needed. 09/13/15  Yes Allena Katz, PA-C  Probiotic Product (ALIGN) 4 MG CAPS Take 1 capsule by mouth daily.    Yes Historical Provider, MD  sitaGLIPtin-metformin (JANUMET) 50-500 MG per tablet Take 1 tablet by mouth 2 (two) times daily with a meal.   Yes Historical Provider, MD  traMADol (ULTRAM) 50 MG tablet Take by mouth every 6 (six) hours as needed.   Yes Historical Provider, MD  diazepam (VALIUM) 5 MG tablet Take 1 tablet (5 mg total) by mouth every 12 (twelve) hours as needed for anxiety (vertigo). 09/19/15   Eber Hong, MD  meclizine (ANTIVERT) 25 MG tablet Take 1 tablet (25 mg total) by mouth 3 (three) times daily as needed for dizziness. 09/19/15   Eber Hong, MD  ondansetron (ZOFRAN ODT) 4 MG disintegrating tablet Take 1 tablet (4 mg total) by mouth every 8 (eight) hours as needed for nausea. 09/19/15   Eber Hong, MD  rosuvastatin (CRESTOR) 5 MG tablet Take 5 mg by mouth 3 (three) times a week.    Historical Provider, MD  VITAMIN A PO Take 1 tablet by mouth daily.    Historical Provider, MD   BP 129/87 mmHg  Pulse 88  Temp(Src) 98.4 F (36.9 C) (Oral)  Resp 18  Ht  (1.676 m)  Wt 210 lb (95.255 kg)  BMI 33.91 kg/m2  SpO2 98% Physical Exam  Constitutional: She appears well-developed and well-nourished. No distress.  HENT:  Head: Normocephalic and atraumatic.  Mouth/Throat: Oropharynx is clear and moist. No oropharyngeal exudate.  Eyes: Conjunctivae and EOM are normal. Pupils are equal, round, and reactive to light. Right eye exhibits no discharge. Left eye exhibits no discharge. No scleral icterus.  Neck: Normal range of motion. Neck supple. No JVD present. No thyromegaly present.  Cardiovascular: Normal rate, regular rhythm, normal heart sounds and intact distal pulses.  Exam reveals no gallop and no friction rub.   No murmur heard. Pulmonary/Chest: Effort normal and breath sounds normal. No respiratory distress. She has no wheezes. She has no rales.  Abdominal: Soft. Bowel sounds are normal. She exhibits no distension and no mass. There is no tenderness.  Musculoskeletal: Normal range of motion. She exhibits no edema or tenderness.  Lymphadenopathy:    She has no cervical adenopathy.  Neurological: She is alert. Coordination normal.  Has nystagmus that fatigues with head movement. Otherwise normal neuro without weakness, numbness or facial droop - normal speech and memory and coordination when not symptomatic.  Skin: Skin is warm and dry. No rash noted. No erythema.  Psychiatric: She has a normal mood and affect. Her behavior is normal.  Nursing note and vitals reviewed.   ED Course  Procedures (including critical care  time) Labs Review Labs Reviewed - No data to display  Imaging Review No results found. I have personally reviewed and evaluated these images and lab results as part of my medical decision-making.    MDM   Final diagnoses:  Vertigo    Vertigo - likely peripheral - fluids, meds, reevaluate.  Pt educated on cause and course, she is in agreement with the tx plan.  Rechecked multiple times - no recurrent vertigo when holding still  C/w peripheral Responded well to valium, fluids Home with same Pt in agreement.  Meds given in ED:  Medications  sodium chloride 0.9 % bolus 1,000 mL (0 mLs Intravenous Stopped 09/19/15 0910)  diazepam (VALIUM) injection 5 mg (5 mg Intravenous Given 09/19/15 0752)    New Prescriptions   DIAZEPAM (VALIUM)  5 MG TABLET    Take 1 tablet (5 mg total) by mouth every 12 (twelve) hours as needed for anxiety (vertigo).   MECLIZINE (ANTIVERT) 25 MG TABLET    Take 1 tablet (25 mg total) by mouth 3 (three) times daily as needed for dizziness.   ONDANSETRON (ZOFRAN ODT) 4 MG DISINTEGRATING TABLET    Take 1 tablet (4 mg total) by mouth every 8 (eight) hours as needed for nausea.      Eber Hong, MD 09/19/15 [phone]

## 2015-10-24 DIAGNOSIS — M25662 Stiffness of left knee, not elsewhere classified: Secondary | ICD-10-CM | POA: Diagnosis not present

## 2015-10-24 DIAGNOSIS — M25562 Pain in left knee: Secondary | ICD-10-CM | POA: Diagnosis not present

## 2015-10-24 DIAGNOSIS — R262 Difficulty in walking, not elsewhere classified: Secondary | ICD-10-CM | POA: Diagnosis not present

## 2015-10-26 DIAGNOSIS — R262 Difficulty in walking, not elsewhere classified: Secondary | ICD-10-CM | POA: Diagnosis not present

## 2015-10-26 DIAGNOSIS — M25562 Pain in left knee: Secondary | ICD-10-CM | POA: Diagnosis not present

## 2015-10-26 DIAGNOSIS — M25662 Stiffness of left knee, not elsewhere classified: Secondary | ICD-10-CM | POA: Diagnosis not present

## 2015-10-31 DIAGNOSIS — M25662 Stiffness of left knee, not elsewhere classified: Secondary | ICD-10-CM | POA: Diagnosis not present

## 2015-10-31 DIAGNOSIS — M25562 Pain in left knee: Secondary | ICD-10-CM | POA: Diagnosis not present

## 2015-10-31 DIAGNOSIS — R262 Difficulty in walking, not elsewhere classified: Secondary | ICD-10-CM | POA: Diagnosis not present

## 2015-11-02 DIAGNOSIS — M25662 Stiffness of left knee, not elsewhere classified: Secondary | ICD-10-CM | POA: Diagnosis not present

## 2015-11-02 DIAGNOSIS — M25562 Pain in left knee: Secondary | ICD-10-CM | POA: Diagnosis not present

## 2015-11-02 DIAGNOSIS — R262 Difficulty in walking, not elsewhere classified: Secondary | ICD-10-CM | POA: Diagnosis not present

## 2015-11-21 DIAGNOSIS — R262 Difficulty in walking, not elsewhere classified: Secondary | ICD-10-CM | POA: Diagnosis not present

## 2015-11-21 DIAGNOSIS — M25562 Pain in left knee: Secondary | ICD-10-CM | POA: Diagnosis not present

## 2015-11-21 DIAGNOSIS — M25662 Stiffness of left knee, not elsewhere classified: Secondary | ICD-10-CM | POA: Diagnosis not present

## 2015-11-23 DIAGNOSIS — M25662 Stiffness of left knee, not elsewhere classified: Secondary | ICD-10-CM | POA: Diagnosis not present

## 2015-11-23 DIAGNOSIS — M25562 Pain in left knee: Secondary | ICD-10-CM | POA: Diagnosis not present

## 2015-11-23 DIAGNOSIS — R262 Difficulty in walking, not elsewhere classified: Secondary | ICD-10-CM | POA: Diagnosis not present

## 2015-12-27 DIAGNOSIS — M25662 Stiffness of left knee, not elsewhere classified: Secondary | ICD-10-CM | POA: Diagnosis not present

## 2015-12-27 DIAGNOSIS — M25562 Pain in left knee: Secondary | ICD-10-CM | POA: Diagnosis not present

## 2015-12-27 DIAGNOSIS — R262 Difficulty in walking, not elsewhere classified: Secondary | ICD-10-CM | POA: Diagnosis not present

## 2016-01-10 ENCOUNTER — Emergency Department (HOSPITAL_COMMUNITY): Payer: BLUE CROSS/BLUE SHIELD

## 2016-01-10 ENCOUNTER — Encounter (HOSPITAL_COMMUNITY): Payer: Self-pay | Admitting: Emergency Medicine

## 2016-01-10 DIAGNOSIS — R079 Chest pain, unspecified: Secondary | ICD-10-CM | POA: Diagnosis not present

## 2016-01-10 DIAGNOSIS — E119 Type 2 diabetes mellitus without complications: Secondary | ICD-10-CM | POA: Insufficient documentation

## 2016-01-10 DIAGNOSIS — D563 Thalassemia minor: Secondary | ICD-10-CM | POA: Diagnosis not present

## 2016-01-10 DIAGNOSIS — E785 Hyperlipidemia, unspecified: Secondary | ICD-10-CM | POA: Insufficient documentation

## 2016-01-10 DIAGNOSIS — I456 Pre-excitation syndrome: Secondary | ICD-10-CM | POA: Diagnosis not present

## 2016-01-10 DIAGNOSIS — R110 Nausea: Secondary | ICD-10-CM | POA: Insufficient documentation

## 2016-01-10 DIAGNOSIS — E669 Obesity, unspecified: Secondary | ICD-10-CM | POA: Diagnosis not present

## 2016-01-10 DIAGNOSIS — K219 Gastro-esophageal reflux disease without esophagitis: Secondary | ICD-10-CM | POA: Diagnosis not present

## 2016-01-10 DIAGNOSIS — Z79899 Other long term (current) drug therapy: Secondary | ICD-10-CM | POA: Insufficient documentation

## 2016-01-10 DIAGNOSIS — I10 Essential (primary) hypertension: Secondary | ICD-10-CM | POA: Insufficient documentation

## 2016-01-10 DIAGNOSIS — Z880 Allergy status to penicillin: Secondary | ICD-10-CM | POA: Diagnosis not present

## 2016-01-10 LAB — BASIC METABOLIC PANEL
Anion gap: 11 (ref 5–15)
BUN: 10 mg/dL (ref 6–20)
CHLORIDE: 96 mmol/L — AB (ref 101–111)
CO2: 28 mmol/L (ref 22–32)
Calcium: 9.7 mg/dL (ref 8.9–10.3)
Creatinine, Ser: 0.88 mg/dL (ref 0.44–1.00)
Glucose, Bld: 180 mg/dL — ABNORMAL HIGH (ref 65–99)
POTASSIUM: 3.1 mmol/L — AB (ref 3.5–5.1)
SODIUM: 135 mmol/L (ref 135–145)

## 2016-01-10 LAB — CBC
HEMATOCRIT: 38.8 % (ref 36.0–46.0)
Hemoglobin: 12.5 g/dL (ref 12.0–15.0)
MCH: 23.1 pg — ABNORMAL LOW (ref 26.0–34.0)
MCHC: 32.2 g/dL (ref 30.0–36.0)
MCV: 71.9 fL — AB (ref 78.0–100.0)
PLATELETS: 321 10*3/uL (ref 150–400)
RBC: 5.4 MIL/uL — AB (ref 3.87–5.11)
RDW: 15.7 % — AB (ref 11.5–15.5)
WBC: 10.8 10*3/uL — AB (ref 4.0–10.5)

## 2016-01-10 LAB — I-STAT TROPONIN, ED: Troponin i, poc: 0 ng/mL (ref 0.00–0.08)

## 2016-01-10 NOTE — ED Notes (Signed)
The patient said she started having chest pain about an hour ago.  She also says she is nauseated.  She has not taken anything for her pain but she rates it 9/10.

## 2016-01-11 ENCOUNTER — Observation Stay (HOSPITAL_BASED_OUTPATIENT_CLINIC_OR_DEPARTMENT_OTHER): Payer: BLUE CROSS/BLUE SHIELD

## 2016-01-11 ENCOUNTER — Observation Stay (HOSPITAL_COMMUNITY): Payer: BLUE CROSS/BLUE SHIELD

## 2016-01-11 ENCOUNTER — Encounter (HOSPITAL_COMMUNITY): Payer: Self-pay | Admitting: Physician Assistant

## 2016-01-11 ENCOUNTER — Observation Stay (HOSPITAL_COMMUNITY)
Admission: EM | Admit: 2016-01-11 | Discharge: 2016-01-11 | Disposition: A | Discharge status: Home / Self Care | Payer: BLUE CROSS/BLUE SHIELD | Attending: Internal Medicine | Admitting: Internal Medicine

## 2016-01-11 DIAGNOSIS — I10 Essential (primary) hypertension: Secondary | ICD-10-CM | POA: Diagnosis present

## 2016-01-11 DIAGNOSIS — E785 Hyperlipidemia, unspecified: Secondary | ICD-10-CM | POA: Diagnosis not present

## 2016-01-11 DIAGNOSIS — I456 Pre-excitation syndrome: Secondary | ICD-10-CM | POA: Diagnosis not present

## 2016-01-11 DIAGNOSIS — R079 Chest pain, unspecified: Secondary | ICD-10-CM

## 2016-01-11 DIAGNOSIS — E118 Type 2 diabetes mellitus with unspecified complications: Secondary | ICD-10-CM

## 2016-01-11 DIAGNOSIS — R072 Precordial pain: Secondary | ICD-10-CM

## 2016-01-11 DIAGNOSIS — K219 Gastro-esophageal reflux disease without esophagitis: Secondary | ICD-10-CM | POA: Diagnosis not present

## 2016-01-11 DIAGNOSIS — R110 Nausea: Secondary | ICD-10-CM | POA: Diagnosis not present

## 2016-01-11 DIAGNOSIS — E119 Type 2 diabetes mellitus without complications: Secondary | ICD-10-CM | POA: Diagnosis not present

## 2016-01-11 DIAGNOSIS — Z1231 Encounter for screening mammogram for malignant neoplasm of breast: Secondary | ICD-10-CM

## 2016-01-11 DIAGNOSIS — E669 Obesity, unspecified: Secondary | ICD-10-CM | POA: Diagnosis not present

## 2016-01-11 DIAGNOSIS — E876 Hypokalemia: Secondary | ICD-10-CM | POA: Diagnosis present

## 2016-01-11 DIAGNOSIS — D563 Thalassemia minor: Secondary | ICD-10-CM | POA: Diagnosis not present

## 2016-01-11 LAB — BASIC METABOLIC PANEL
ANION GAP: 11 (ref 5–15)
BUN: 7 mg/dL (ref 6–20)
CHLORIDE: 97 mmol/L — AB (ref 101–111)
CO2: 30 mmol/L (ref 22–32)
Calcium: 9.8 mg/dL (ref 8.9–10.3)
Creatinine, Ser: 0.79 mg/dL (ref 0.44–1.00)
GFR calc Af Amer: >60 mL/min (ref >60)
GFR calc non Af Amer: >60 mL/min (ref >60)
Glucose, Bld: 138 mg/dL — ABNORMAL HIGH (ref 65–99)
POTASSIUM: 3.4 mmol/L — AB (ref 3.5–5.1)
SODIUM: 138 mmol/L (ref 135–145)

## 2016-01-11 LAB — URINALYSIS, ROUTINE W REFLEX MICROSCOPIC
BILIRUBIN URINE: NEGATIVE
Glucose, UA: NEGATIVE mg/dL
Hgb urine dipstick: NEGATIVE
KETONES UR: NEGATIVE mg/dL
LEUKOCYTES UA: NEGATIVE
NITRITE: NEGATIVE
PH: 5 (ref 5.0–8.0)
Protein, ur: NEGATIVE mg/dL
SPECIFIC GRAVITY, URINE: 1.01 (ref 1.005–1.030)

## 2016-01-11 LAB — TROPONIN I

## 2016-01-11 LAB — LIPID PANEL
Cholesterol: 197 mg/dL (ref 0–200)
HDL: 42 mg/dL (ref >40)
LDL Cholesterol: 132 mg/dL — ABNORMAL HIGH (ref 0–99)
TRIGLYCERIDES: 114 mg/dL (ref <150)
Total CHOL/HDL Ratio: 4.7 RATIO
VLDL: 23 mg/dL (ref 0–40)

## 2016-01-11 LAB — NM MYOCAR MULTI W/SPECT W/WALL MOTION / EF
CSEPED: 0 min
CSEPEDS: 0 s
Estimated workload: 1 METS
MPHR: 179 {beats}/min
Peak HR: 130 {beats}/min
Percent HR: 72 %
Rest HR: 68 {beats}/min

## 2016-01-11 LAB — GLUCOSE, CAPILLARY
GLUCOSE-CAPILLARY: 130 mg/dL — AB (ref 65–99)
GLUCOSE-CAPILLARY: 124 mg/dL — AB (ref 65–99)
GLUCOSE-CAPILLARY: 148 mg/dL — AB (ref 65–99)

## 2016-01-11 LAB — ECHOCARDIOGRAM COMPLETE

## 2016-01-11 LAB — IRON AND TIBC
IRON: 62 ug/dL (ref 28–170)
SATURATION RATIOS: 18 % (ref 10.4–31.8)
TIBC: 354 ug/dL (ref 250–450)
UIBC: 292 ug/dL

## 2016-01-11 LAB — D-DIMER, QUANTITATIVE (NOT AT ARMC): D DIMER QUANT: 0.29 ug{FEU}/mL (ref 0.00–0.50)

## 2016-01-11 MED ORDER — DIAZEPAM 5 MG PO TABS: 5.0000 mg | ORAL_TABLET | Freq: Two times a day (BID) | ORAL | Status: DC | PRN

## 2016-01-11 MED ORDER — GI COCKTAIL ~~LOC~~
30.0000 mL | Freq: Four times a day (QID) | ORAL | Status: DC | PRN
  Administered 2016-01-11: 30 mL via ORAL
  Filled 2016-01-11: qty 30

## 2016-01-11 MED ORDER — TRAMADOL HCL 50 MG PO TABS: 50.0000 mg | ORAL_TABLET | Freq: Four times a day (QID) | ORAL | Status: DC | PRN

## 2016-01-11 MED ORDER — REGADENOSON 0.4 MG/5ML IV SOLN
INTRAVENOUS | Status: AC
  Administered 2016-01-11: 0.4 mg via INTRAVENOUS
  Filled 2016-01-11: qty 5

## 2016-01-11 MED ORDER — REGADENOSON 0.4 MG/5ML IV SOLN
0.4000 mg | Freq: Once | INTRAVENOUS | Status: AC
  Administered 2016-01-11: 0.4 mg via INTRAVENOUS
  Filled 2016-01-11: qty 5

## 2016-01-11 MED ORDER — ENOXAPARIN SODIUM 40 MG/0.4ML ~~LOC~~ SOLN
40.0000 mg | SUBCUTANEOUS | Status: DC
  Administered 2016-01-11: 40 mg via SUBCUTANEOUS
  Filled 2016-01-11: qty 0.4

## 2016-01-11 MED ORDER — ROSUVASTATIN CALCIUM 10 MG PO TABS: 5.0000 mg | ORAL_TABLET | ORAL | Status: DC

## 2016-01-11 MED ORDER — TECHNETIUM TC 99M SESTAMIBI GENERIC - CARDIOLITE
10.0000 | Freq: Once | INTRAVENOUS | Status: AC | PRN
  Administered 2016-01-11: 10 via INTRAVENOUS

## 2016-01-11 MED ORDER — LORATADINE 10 MG PO TABS: 10.0000 mg | ORAL_TABLET | Freq: Every day | ORAL | Status: DC | PRN

## 2016-01-11 MED ORDER — ACETAMINOPHEN 325 MG PO TABS
650.0000 mg | ORAL_TABLET | ORAL | Status: DC | PRN
  Administered 2016-01-11: 650 mg via ORAL
  Filled 2016-01-11: qty 2

## 2016-01-11 MED ORDER — POTASSIUM CHLORIDE CRYS ER 20 MEQ PO TBCR
40.0000 meq | EXTENDED_RELEASE_TABLET | Freq: Once | ORAL | Status: AC
Start: 2016-01-11 — End: 2016-01-11
  Administered 2016-01-11: 40 meq via ORAL
  Filled 2016-01-11: qty 2

## 2016-01-11 MED ORDER — INSULIN ASPART 100 UNIT/ML ~~LOC~~ SOLN: 0.0000 [IU] | Freq: Four times a day (QID) | SUBCUTANEOUS | Status: DC

## 2016-01-11 MED ORDER — HYDROCHLOROTHIAZIDE 25 MG PO TABS
25.0000 mg | ORAL_TABLET | Freq: Every day | ORAL | Status: DC
  Administered 2016-01-11: 25 mg via ORAL
  Filled 2016-01-11: qty 1

## 2016-01-11 MED ORDER — BENZONATATE 100 MG PO CAPS: 100.0000 mg | ORAL_CAPSULE | Freq: Three times a day (TID) | ORAL | Status: DC | PRN

## 2016-01-11 MED ORDER — ASPIRIN 81 MG PO CHEW
324.0000 mg | CHEWABLE_TABLET | Freq: Once | ORAL | Status: AC
  Administered 2016-01-11: 324 mg via ORAL
  Filled 2016-01-11: qty 4

## 2016-01-11 MED ORDER — ONDANSETRON HCL 4 MG/2ML IJ SOLN: 4.0000 mg | Freq: Four times a day (QID) | INTRAMUSCULAR | Status: DC | PRN

## 2016-01-11 MED ORDER — IBUPROFEN 200 MG PO TABS
400.0000 mg | ORAL_TABLET | Freq: Once | ORAL | Status: DC | PRN
  Filled 2016-01-11: qty 1

## 2016-01-11 MED ORDER — ONDANSETRON 4 MG PO TBDP
4.0000 mg | ORAL_TABLET | Freq: Once | ORAL | Status: AC | PRN
  Administered 2016-01-11: 4 mg via ORAL
  Filled 2016-01-11: qty 1

## 2016-01-11 MED ORDER — TECHNETIUM TC 99M SESTAMIBI GENERIC - CARDIOLITE
30.0000 | Freq: Once | INTRAVENOUS | Status: AC | PRN
  Administered 2016-01-11: 30 via INTRAVENOUS

## 2016-01-11 MED ORDER — MORPHINE SULFATE (PF) 2 MG/ML IV SOLN: 2.0000 mg | INTRAVENOUS | Status: DC | PRN

## 2016-01-11 NOTE — Discharge Summary (Addendum)
Physician Discharge Summary  Carolyn Phillips ZOX:[id number] DOB: July 23, 1976 DOA: 01/11/2016  PCP: Claudell Kyle, MD  Admit date: 01/11/2016 Discharge date: 01/11/2016  Time spent: 20 minutes  Recommendations for Outpatient Follow-up:  1. Follow up with PCP in 2-3 weeks 2. Please follow up 2d echo results done on 4/13  Discharge Diagnoses:  Principal Problem:   Chest pain Active Problems:   Hypertension   Diabetes mellitus (HCC)   Dyslipidemia   Hypokalemia   Discharge Condition: Stable  Diet recommendation: Diabetic, Heart healthy  Filed Weights   01/10/16 2133  Weight: 96.163 kg (212 lb)    History of present illness:  Please review dictated H and P from 4/13 for details. Briefly, 40yo with a past medical history significant for WPW s/p ablation in 2015 followed by Dr. Coralee North, HTN, dyslipidemia, diabetes mellitus type 2; who presents with complaints of chest pain.  Hospital Course:  Chest pain: Acute left-sided chest pain that was intermittent, nonradiating, and nonexertional. The d-dimer was negative and therefore patient is less likely to have a clot. Patient has a heart score of 4. - Admitted to a telemetry bed - Serial trop neg - Echocardiogram was done. Awaiting results. Discussed with Cardiology who states pt can still go home and f/u results of echo with PCP - Cardiology was consulted and patient underwent stress testing on 4/13 with low risk findings  Leukocytosis: WBC 10.8 on admission - Urinalysis was normal - Patient remained afebrile and asymptomatic  Suspected iron deficiency: MCH and MCV noted to be low with elevated RDW. - Check iron studies demonstrated a normal iron level of 62  Essential hypertension: Well controlled without medications -Continue to monitor   Diabetes mellitus type 2 with mild hyperglycemia: Blood glucose 180 on admission - Held Janumet -CBGs every 6 hours while admitted  Hypokalemia: Potassium 3.1 on admission.  Patient was given 40 mEq of potassium chloride and ED  Dyslipidemia - Continued Crestor  Procedures:  Stress test 4/13  Consultations:  Cardiology  Discharge Exam: Filed Vitals:   01/11/16 1307 01/11/16 1309 01/11/16 1310 01/11/16 1514  BP: 178/92 164/86  125/82  Pulse: 116 102 103 70  Temp:    98.2 F (36.8 C)  TempSrc:    Oral  Resp:      Height:      Weight:      SpO2:    100%    General: Awake, in nad Cardiovascular: regular, s1, s2 Respiratory: normal resp effort, no wheezing  Discharge Instructions     Medication List    TAKE these medications        albuterol 108 (90 Base) MCG/ACT inhaler  Commonly known as:  PROVENTIL HFA;VENTOLIN HFA  Inhale 1-2 puffs into the lungs every 6 (six) hours as needed for wheezing or shortness of breath.     ALIGN 4 MG Caps  Take 1 capsule by mouth daily.     benzonatate 100 MG capsule  Commonly known as:  TESSALON  Take 100 mg by mouth 3 (three) times daily as needed for cough.     Biotin 2500 MCG Caps  Take 2 capsules by mouth daily.     diazepam 5 MG tablet  Commonly known as:  VALIUM  Take 1 tablet (5 mg total) by mouth every 12 (twelve) hours as needed for anxiety (vertigo).     hydrochlorothiazide 25 MG tablet  Commonly known as:  HYDRODIURIL  Take 25 mg by mouth daily.     loratadine 10 MG tablet  Commonly known as:  CLARITIN  Take 10 mg by mouth daily as needed for allergies.     meclizine 25 MG tablet  Commonly known as:  ANTIVERT  Take 1 tablet (25 mg total) by mouth 3 (three) times daily as needed for dizziness.     ondansetron 4 MG disintegrating tablet  Commonly known as:  ZOFRAN ODT  Take 1 tablet (4 mg total) by mouth every 8 (eight) hours as needed for nausea.     rosuvastatin 5 MG tablet  Commonly known as:  CRESTOR  Take 5 mg by mouth 3 (three) times a week.     sitaGLIPtin-metformin 50-1000 MG tablet  Commonly known as:  JANUMET  Take 2 tablets by mouth every evening.     traMADol  50 MG tablet  Commonly known as:  ULTRAM  Take 50 mg by mouth every 6 (six) hours as needed for moderate pain.     VITAMIN A PO  Take 1 tablet by mouth daily.     VITAMIN B 12 PO  Take 1 tablet by mouth daily.     Vitamin D3 3000 units Tabs  Take 3,000 Units by mouth daily.     ZINC PO  Take 1 tablet by mouth daily.       Allergies  Allergen Reactions  . Penicillins Hives and Swelling    Has patient had a PCN reaction causing immediate rash, facial/tongue/throat swelling, SOB or lightheadedness with hypotension: Yes Has patient had a PCN reaction causing severe rash involving mucus membranes or skin necrosis: No Has patient had a PCN reaction that required hospitalization No Has patient had a PCN reaction occurring within the last 10 years: No If all of the above answers are "NO", then may proceed with Cephalosporin use.   Eye swelling  . Metoprolol     "LOL" Drugs cause hair to fall out  . Other Nausea And Vomiting    General Anesthesia    Follow-up Information    [facility] Schedule an appointment as soon as possible for a visit in 2 weeks.   Specialty:  Family Medicine   Why:  Hospital follow up   Contact information:   [address] [address] [place] [id number] [phone]        The results of significant diagnostics from this hospitalization (including imaging, microbiology, ancillary and laboratory) are listed below for reference.    Significant Diagnostic Studies: Dg Chest 2 View  01/10/2016  CLINICAL DATA:  Lambert ModySharp left-sided chest pain and nausea. EXAM: CHEST  2 VIEW COMPARISON:  11/03/2014 FINDINGS: There is stable right hemidiaphragm elevation. The lungs are clear. There is no pleural effusion. Hilar, mediastinal and cardiac contours are unremarkable and unchanged. Pulmonary vasculature is normal. IMPRESSION: No active cardiopulmonary disease. Electronically Signed   By: Ellery Plunkaniel R Mitchell M.D.   On: 01/10/2016 22:16   Nm Myocar Multi  W/spect W/wall Motion / Ef  01/11/2016  CLINICAL DATA:  Chest pain. History of diabetes, hypertension and SVT. EXAM: MYOCARDIAL IMAGING WITH SPECT (REST AND PHARMACOLOGIC-STRESS) GATED LEFT VENTRICULAR WALL MOTION STUDY LEFT VENTRICULAR EJECTION FRACTION TECHNIQUE: Standard myocardial SPECT imaging was performed after resting intravenous injection of 10 mCi Tc-5512m sestamibi. Subsequently, intravenous infusion of Lexiscan was performed under the supervision of the Cardiology staff. At peak effect of the drug, 30 mCi Tc-2112m sestamibi was injected intravenously and standard myocardial SPECT imaging was performed. Quantitative gated imaging was also performed to evaluate left ventricular wall motion, and estimate left ventricular ejection fraction.  COMPARISON:  Chest radiographs 01/10/2016. FINDINGS: Perfusion: No decreased activity in the left ventricle on stress imaging to suggest reversible ischemia or infarction. There is mildly variable activity within the anterior wall, attributed to breast attenuation. Wall Motion: Normal left ventricular wall motion. No left ventricular dilation. Left Ventricular Ejection Fraction: 65 % End diastolic volume 79 ml End systolic volume 28 ml IMPRESSION: 1. No reversible ischemia or infarction. 2. Normal left ventricular wall motion. 3. Left ventricular ejection fraction 65% 4. Low-risk stress test findings*. *2012 Appropriate Use Criteria for Coronary Revascularization Focused Update: J Am Coll Cardiol. 2012;59(9):857-881. http://content.dementiazones.com.aspx?articleid=[email or web address] Electronically Signed   By: Carey Bullocks M.D.   On: 01/11/2016 15:41    Microbiology: No results found for this or any previous visit (from the past 240 hour(s)).   Labs: Basic Metabolic Panel:  Recent Labs Lab 01/10/16 2144  NA 135  K 3.1*  CL 96*  CO2 28  GLUCOSE 180*  BUN 10  CREATININE 0.88  CALCIUM 9.7   Liver Function Tests: No results for input(s): AST, ALT, ALKPHOS,  BILITOT, PROT, ALBUMIN in the last 168 hours. No results for input(s): LIPASE, AMYLASE in the last 168 hours. No results for input(s): AMMONIA in the last 168 hours. CBC:  Recent Labs Lab 01/10/16 2144  WBC 10.8*  HGB 12.5  HCT 38.8  MCV 71.9*  PLT 321   Cardiac Enzymes:  Recent Labs Lab 01/11/16 0608 01/11/16 0803  TROPONINI <0.03 <0.03   BNP: BNP (last 3 results) No results for input(s): BNP in the last 8760 hours.  ProBNP (last 3 results) No results for input(s): PROBNP in the last 8760 hours.  CBG:  Recent Labs Lab 01/11/16 0656 01/11/16 0957 01/11/16 1510  GLUCAP 130* 124* 148*    Signed:  Brae Gartman K  [facility] 01/11/2016, 5:37 PM

## 2016-01-11 NOTE — H&P (Addendum)
[facility] History and Physical  Daniyla Pfahler RUE:[id number] DOB: 1976/08/19 DOA: 01/11/2016  Referring physician: ED PCP: Claudell Kyle, MD   Chief Complaint: Chest pain  HPI:  Carolyn Phillips is a 40 year old female with a past medical history significant for WPW s/p ablation in 2015 followed by Dr. Coralee North, HTN, dyslipidemia, diabetes mellitus type 2; who presents with complaints of chest pain. 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 She reports feeling a sharp pain on the left side of her chest. She initially was not too concerned except for symptoms occurred again and had associated symptoms of feeling nauseous. Pain symptoms did not radiate anywhere. She decided to come to the ER thereafter reports having intermittent sharp pains on the left side of her chest that lasts a couple minutes and self resolved. Patient denies ever having symptoms like this similarly in the past when she had Wolff-Parkinson-White previously before. Her last echocardiogram was in 05/2014 and she was noted to have a normal EF of 55-60% with no significant structural abnormalities. She also states that she had recently traveled on a plane and had knee surgery.   Upon admission patient was evaluated and laboratory revealed WBC 10.8, hemoglobin 12.5, MCV 71.9, MCH 23.1, sodium 135, potassium 3.1, chloride 96, CO2 28, BUN 10 creatinine 0.88, glucose 180, troponin 0.0, d-dimer 0.29.     Review of Systems  Constitutional: Positive for weight loss (Lost 1 pound recently). Negative for fever, chills and diaphoresis.  HENT: Negative for ear pain and tinnitus.   Eyes: Negative for photophobia and pain.  Respiratory: Negative for hemoptysis and sputum production.   Cardiovascular: Positive for chest pain and leg swelling. Negative for palpitations.  Gastrointestinal: Positive for nausea. Negative for vomiting and abdominal pain.   Genitourinary: Negative for urgency and frequency.  Musculoskeletal: Negative for back pain and neck pain.  Neurological: Negative for sensory change and speech change.  Endo/Heme/Allergies: Negative for environmental allergies. Does not bruise/bleed easily.  Psychiatric/Behavioral: Negative for hallucinations and substance abuse.       Past Medical History  Diagnosis Date  . Wolff-Parkinson-White syndrome   . Hypertension   . Hyperlipemia   . Diabetes mellitus without complication (HCC)   . GERD (gastroesophageal reflux disease)   . Obesity   . Complication of anesthesia   . PONV (postoperative nausea and vomiting)   . Beta thalassemia trait   . Dysrhythmia     WPW-ablation  . Headache      Past Surgical History  Procedure Laterality Date  . Abdominal hysterectomy    . Bladder repair    . Breast reduction surgery    . Tubal ligation    . Supraventricular tachycardia ablation N/A 07/04/2014    Procedure: WPW ABLATION;  Surgeon: Marinus Maw, MD;  Location[facility] CATH LAB;  Service: Cardiovascular;  Laterality: N/A;  . Knee arthroscopy with drilling/microfracture Left 09/13/2015    Procedure: KNEE ARTHROSCOPY WITH DRILLING/MICROFRACTURE;  Surgeon: Gean Birchwood, MD;  Location: [facility];  Service: Orthopedics;  Laterality: Left;  . Chondroplasty Left 09/13/2015    Procedure: CHONDROPLASTY;  Surgeon: Gean Birchwood, MD;  Location: [facility];  Service: Orthopedics;  Laterality: Left;      Social History:  reports that she has never smoked. She has never used smokeless tobacco. She reports that she drinks alcohol. She reports that she does not use illicit drugs. Where does patient live--home  Can patient  participate in ADLs?Yes  Allergies  Allergen Reactions  . Penicillins Hives and Swelling    Has patient had a PCN reaction causing immediate rash, facial/tongue/throat swelling, SOB or lightheadedness with hypotension: Yes Has patient had a PCN  reaction causing severe rash involving mucus membranes or skin necrosis: No Has patient had a PCN reaction that required hospitalization No Has patient had a PCN reaction occurring within the last 10 years: No If all of the above answers are "NO", then may proceed with Cephalosporin use.   Eye swelling  . Metoprolol     "LOL" Drugs cause hair to fall out  . Other Nausea And Vomiting    General Anesthesia     Family History  Problem Relation Age of Onset  . Valvular heart disease Mother         Prior to Admission medications   Medication Sig Start Date End Date Taking? Authorizing Provider  albuterol (PROVENTIL HFA;VENTOLIN HFA) 108 (90 Base) MCG/ACT inhaler Inhale 1-2 puffs into the lungs every 6 (six) hours as needed for wheezing or shortness of breath.   Yes Historical Provider, MD  benzonatate (TESSALON) 100 MG capsule Take 100 mg by mouth 3 (three) times daily as needed for cough.   Yes Historical Provider, MD  Biotin 2500 MCG CAPS Take 2 capsules by mouth daily.   Yes Historical Provider, MD  Cholecalciferol (VITAMIN D3) 3000 UNITS TABS Take 3,000 Units by mouth daily.   Yes Historical Provider, MD  Cyanocobalamin (VITAMIN B 12 PO) Take 1 tablet by mouth daily.   Yes Historical Provider, MD  diazepam (VALIUM) 5 MG tablet Take 1 tablet (5 mg total) by mouth every 12 (twelve) hours as needed for anxiety (vertigo). 09/19/15  Yes Eber Hong, MD  hydrochlorothiazide (HYDRODIURIL) 25 MG tablet Take 25 mg by mouth daily.    Yes Historical Provider, MD  loratadine (CLARITIN) 10 MG tablet Take 10 mg by mouth daily as needed for allergies.   Yes Historical Provider, MD  meclizine (ANTIVERT) 25 MG tablet Take 1 tablet (25 mg total) by mouth 3 (three) times daily as needed for dizziness. 09/19/15  Yes Eber Hong, MD  Multiple Vitamins-Minerals (ZINC PO) Take 1 tablet by mouth daily.   Yes Historical Provider, MD  ondansetron (ZOFRAN ODT) 4 MG disintegrating tablet Take 1 tablet (4 mg  total) by mouth every 8 (eight) hours as needed for nausea. 09/19/15  Yes Eber Hong, MD  Probiotic Product (ALIGN) 4 MG CAPS Take 1 capsule by mouth daily.    Yes Historical Provider, MD  rosuvastatin (CRESTOR) 5 MG tablet Take 5 mg by mouth 3 (three) times a week.   Yes Historical Provider, MD  sitaGLIPtin-metformin (JANUMET) 50-1000 MG tablet Take 2 tablets by mouth every evening.   Yes Historical Provider, MD  traMADol (ULTRAM) 50 MG tablet Take 50 mg by mouth every 6 (six) hours as needed for moderate pain.    Yes Historical Provider, MD  VITAMIN A PO Take 1 tablet by mouth daily.   Yes Historical Provider, MD     Physical Exam: Filed Vitals:   01/11/16 0330 01/11/16 0345 01/11/16 0400 01/11/16 0430  BP: 160/126 130/96 133/89 139/83  Pulse: 64 68 63 72  Temp:      TempSrc:      Resp: Height:      Weight:      SpO2: 99% 99% 98% 96%     Constitutional: Vital signs reviewed. Patient is a well-developed and  well-nourished in no acute distress and cooperative with exam. Alert and oriented x3.  Head: Normocephalic and atraumatic  Ear: TM normal bilaterally  Mouth: no erythema or exudates, MMM  Eyes: PERRL, EOMI, conjunctivae normal, No scleral icterus.  Neck: Supple, Trachea midline normal ROM, No JVD, mass, thyromegaly, or carotid bruit present.  Cardiovascular: RRR, S1 normal, S2 normal, no MRG, pulses symmetric and intact bilaterally  Pulmonary/Chest: CTAB, no wheezes, rales, or rhonchi  Abdominal: Soft. Non-tender, non-distended, bowel sounds are normal, no masses, organomegaly, or guarding present.  GU: no CVA tenderness Musculoskeletal: No joint deformities, erythema, or stiffness, ROM full and no nontender Ext: 1+ pitting edema of the bilateral lower extremities. No cyanosis, pulses palpable bilaterally (DP and PT)  Hematology: no cervical, inginal, or axillary adenopathy.  Neurological: A&O x3, Strenght is normal and symmetric bilaterally, cranial nerve II-XII  are grossly intact, no focal motor deficit, sensory intact to light touch bilaterally.  Skin: Warm, dry and intact. No rash, cyanosis, or clubbing.  Psychiatric: Normal mood and affect. speech and behavior is normal. Judgment and thought content normal. Cognition and memory are normal.      Data Review   Micro Results No results found for this or any previous visit (from the past 240 hour(s)).  Radiology Reports Dg Chest 2 View  01/10/2016  CLINICAL DATA:  Lambert ModySharp left-sided chest pain and nausea. EXAM: CHEST  2 VIEW COMPARISON:  11/03/2014 FINDINGS: There is stable right hemidiaphragm elevation. The lungs are clear. There is no pleural effusion. Hilar, mediastinal and cardiac contours are unremarkable and unchanged. Pulmonary vasculature is normal. IMPRESSION: No active cardiopulmonary disease. Electronically Signed   By: Ellery Plunkaniel R Mitchell M.D.   On: 01/10/2016 22:16     CBC  Recent Labs Lab 01/10/16 2144  WBC 10.8*  HGB 12.5  HCT 38.8  PLT 321  MCV 71.9*  MCH 23.1*  MCHC 32.2  RDW 15.7*    Chemistries   Recent Labs Lab 01/10/16 2144  NA 135  K 3.1*  CL 96*  CO2 28  GLUCOSE 180*  BUN 10  CREATININE 0.88  CALCIUM 9.7   ------------------------------------------------------------------------------------------------------------------ estimated creatinine clearance is 91 mL/min (by C-G formula based on Cr of 0.88). ------------------------------------------------------------------------------------------------------------------ No results for input(s): HGBA1C in the last 72 hours. ------------------------------------------------------------------------------------------------------------------ No results for input(s): CHOL, HDL, LDLCALC, TRIG, CHOLHDL, LDLDIRECT in the last 72 hours. ------------------------------------------------------------------------------------------------------------------ No results for input(s): TSH, T4TOTAL, T3FREE, THYROIDAB in the last 72  hours.  Invalid input(s): FREET3 ------------------------------------------------------------------------------------------------------------------ No results for input(s): VITAMINB12, FOLATE, FERRITIN, TIBC, IRON, RETICCTPCT in the last 72 hours.  Coagulation profile No results for input(s): INR, PROTIME in the last 168 hours.   Recent Labs  01/11/16 0344  DDIMER 0.29    Cardiac Enzymes No results for input(s): CKMB, TROPONINI, MYOGLOBIN in the last 168 hours.  Invalid input(s): CK ------------------------------------------------------------------------------------------------------------------ Invalid input(s): POCBNP   CBG: No results for input(s): GLUCAP in the last 168 hours.     EKG: Independently reviewed.   Assessment/Plan Chest pain: Acute left-sided chest pain that was intermittent, nonradiating, and nonexertional. Initial troponin 0.0 and subtle EKG findings. Given recent history of surgery and travel a d-dimer was checked. The d-dimer was negative and therefore patient is less likely to have a clot. Patient has a heart score of 4. - Admit to a telemetry bed - Trend cardiac enzymes - check lipid panel - Repeat EKG in a.m. - Echocardiogram - Consult cardiology in a.m.  Leukocytosis: WBC 10.8 on admission this could be reactive  versus possibility of underlying infection. Chest x-ray appeared to be clear. - Check urinalysis  Suspected iron deficiency: MCH and MCV noted to be low with elevated RDW. - Check iron studies   Essential hypertension: Well controlled without medications -Continue to monitor   Diabetes mellitus type 2 with mild hyperglycemia: Blood glucose 180 on admission - Held Janumet -  CBGs every 6 hours with sensitive a sliding scale insulin  Hypokalemia: Potassium 3.1 on admission. Given 40 mEq of potassium chloride and ED - Continue to monitor and replace as needed  Dyslipidemia - Continue Crestor  Code Status:   full Family  Communication: bedside Disposition Plan: admit   Total time spent 55 minutes.Greater than 50% of this time was spent in counseling, explanation of diagnosis, planning of further management, and coordination of care  Clydie Braun [facility] Pager [phone]  If 7PM-7AM, please contact night-coverage [email or web address] Password [facility] 01/11/2016, 4:58 AM

## 2016-01-11 NOTE — Care Management Note (Signed)
Case Management Note  Patient Details  Name: Carolyn Phillips MRN: [id number] Date of Birth: [birth year]  Subjective/Objective:         Chest pain          Action/Plan: Discharge Planning: AVS reviewed: Chart reviewed. No NCM needs identified. Insurance to cover medications.   PCP MCMILLIAN, TRACI MD Expected Discharge Date:                  Expected Discharge Plan:  Home/Self Care  In-House Referral:  NA  Discharge planning Services  NA  Post Acute Care Choice:  NA Choice offered to:  NA  DME Arranged:  N/A DME Agency:  NA  HH Arranged:  NA HH Agency:  NA  Status of Service:  Completed, signed off  Medicare Important Message Given:    Date Medicare IM Given:    Medicare IM give by:    Date Additional Medicare IM Given:    Additional Medicare Important Message give by:     If discussed at Long Length of Stay Meetings, dates discussed:    Additional Comments:  Elliot CousinShavis, Alf Doyle Ellen, RN 01/11/2016, 5:03 PM

## 2016-01-11 NOTE — ED Notes (Signed)
Planning for admission

## 2016-01-11 NOTE — Progress Notes (Signed)
  Echocardiogram 2D Echocardiogram has been performed.  Delcie RochENNINGTON, Leonela Kivi 01/11/2016, 5:46 PM

## 2016-01-11 NOTE — Progress Notes (Signed)
Patient discharge paperwork gone over with patient in detail. All questions answered to patients satisfaction.Telemetry discontinued, IV removed intact. Patient discharged to home with family by way of wheelchair. 

## 2016-01-11 NOTE — ED Provider Notes (Signed)
CSN: [id number]     Arrival date & time 01/10/16  2128 History  By signing my name below, I, Bethel BornBritney McCollum, attest that this documentation has been prepared under the direction and in the presence of Rolan BuccoMelanie Parvin Stetzer, MD. Electronically Signed: Bethel BornBritney McCollum, ED Scribe. 01/11/2016. 4:57 AM   Chief Complaint  Patient presents with  . Chest Pain    The patient said she started having chest pain about an hour ago.  She also says she is nauseated.  She has not taken anything for her pain but she rates it 9/10.     The history is provided by the patient. No language interpreter was used.   Carolyn Phillips is a 40 y.o. female with history of WPW s/p ablation by Dr. Coralee Northiller, HTN, DM, HLD, and obesity who presents to the Emergency Department complaining of intermittent, sharp, 9/10 in severity, central chest pain with onset 7 hours ago. She is pain free at present. Associated symptoms include nausea. Pt denies SOB, vomiting, LE swelling, fever, cough, and diarrhea. [place] and [place] surgery in this year. Her grandmother had a heart attack in her 3350s.   Past Medical History  Diagnosis Date  . Wolff-Parkinson-White syndrome   . Hypertension   . Hyperlipemia   . Diabetes mellitus without complication (HCC)   . GERD (gastroesophageal reflux disease)   . Obesity   . Complication of anesthesia   . PONV (postoperative nausea and vomiting)   . Beta thalassemia trait   . Dysrhythmia     WPW-ablation  . Headache    Past Surgical History  Procedure Laterality Date  . Abdominal hysterectomy    . Bladder repair    . Breast reduction surgery    . Tubal ligation    . Supraventricular tachycardia ablation N/A 07/04/2014    Procedure: WPW ABLATION;  Surgeon: Marinus MawGregg W Taylor, MD;  [facility];  Service: Cardiovascular;  Laterality: N/A;  . Knee arthroscopy with drilling/microfracture Left 09/13/2015    Procedure: KNEE ARTHROSCOPY WITH DRILLING/MICROFRACTURE;   Surgeon: Gean BirchwoodFrank Rowan, MD;  Location: [facility];  Service: Orthopedics;  Laterality: Left;  . Chondroplasty Left 09/13/2015    Procedure: CHONDROPLASTY;  Surgeon: Gean BirchwoodFrank Rowan, MD;  Location: [facility];  Service: Orthopedics;  Laterality: Left;   Family History  Problem Relation Age of Onset  . Valvular heart disease Mother    Social History  Substance Use Topics  . Smoking status: Never Smoker   . Smokeless tobacco: Never Used     Comment: only smoked for one year along time ago "  . Alcohol Use: Yes     Comment: occasional   OB History    No data available     Review of Systems  Constitutional: Negative for fever and diaphoresis.  Respiratory: Negative for cough and shortness of breath.   Cardiovascular: Positive for chest pain.  Gastrointestinal: Positive for nausea. Negative for vomiting.  All other systems reviewed and are negative.  Allergies  Penicillins; Metoprolol; and Other  Home Medications   Prior to Admission medications   Medication Sig Start Date End Date Taking? Authorizing Provider  albuterol (PROVENTIL HFA;VENTOLIN HFA) 108 (90 Base) MCG/ACT inhaler Inhale 1-2 puffs into the lungs every 6 (six) hours as needed for wheezing or shortness of breath.   Yes Historical Provider, MD  benzonatate (TESSALON) 100 MG capsule Take 100 mg by mouth 3 (three) times daily as needed for cough.   Yes Historical  Provider, MD  Biotin 2500 MCG CAPS Take 2 capsules by mouth daily.   Yes Historical Provider, MD  Cholecalciferol (VITAMIN D3) 3000 UNITS TABS Take 3,000 Units by mouth daily.   Yes Historical Provider, MD  Cyanocobalamin (VITAMIN B 12 PO) Take 1 tablet by mouth daily.   Yes Historical Provider, MD  diazepam (VALIUM) 5 MG tablet Take 1 tablet (5 mg total) by mouth every 12 (twelve) hours as needed for anxiety (vertigo). 09/19/15  Yes Eber Hong, MD  hydrochlorothiazide (HYDRODIURIL) 25 MG tablet Take 25 mg by mouth daily.    Yes  Historical Provider, MD  loratadine (CLARITIN) 10 MG tablet Take 10 mg by mouth daily as needed for allergies.   Yes Historical Provider, MD  meclizine (ANTIVERT) 25 MG tablet Take 1 tablet (25 mg total) by mouth 3 (three) times daily as needed for dizziness. 09/19/15  Yes Eber Hong, MD  Multiple Vitamins-Minerals (ZINC PO) Take 1 tablet by mouth daily.   Yes Historical Provider, MD  ondansetron (ZOFRAN ODT) 4 MG disintegrating tablet Take 1 tablet (4 mg total) by mouth every 8 (eight) hours as needed for nausea. 09/19/15  Yes Eber Hong, MD  Probiotic Product (ALIGN) 4 MG CAPS Take 1 capsule by mouth daily.    Yes Historical Provider, MD  rosuvastatin (CRESTOR) 5 MG tablet Take 5 mg by mouth 3 (three) times a week.   Yes Historical Provider, MD  sitaGLIPtin-metformin (JANUMET) 50-1000 MG tablet Take 2 tablets by mouth every evening.   Yes Historical Provider, MD  traMADol (ULTRAM) 50 MG tablet Take 50 mg by mouth every 6 (six) hours as needed for moderate pain.    Yes Historical Provider, MD  VITAMIN A PO Take 1 tablet by mouth daily.   Yes Historical Provider, MD   BP 162/100 mmHg  Pulse 89  Temp(Src) 98 F (36.7 C) (Oral)  Resp 20  Ht  (1.575 m)  Wt 212 lb (96.163 kg)  BMI 38.77 kg/m2  SpO2 100% Physical Exam  Constitutional: She is oriented to person, place, and time. She appears well-developed and well-nourished. No distress.  HENT:  Head: Normocephalic and atraumatic.  Eyes: EOM are normal.  Neck: Normal range of motion.  Cardiovascular: Normal rate, regular rhythm and normal heart sounds.   Pulmonary/Chest: Effort normal and breath sounds normal.  Abdominal: Soft. She exhibits no distension. There is no tenderness.  Musculoskeletal: Normal range of motion.  No edema or calf tenderness   Neurological: She is alert and oriented to person, place, and time.  Skin: Skin is warm and dry.  Psychiatric: She has a normal mood and affect. Judgment normal.  Nursing note and  vitals reviewed.   ED Course  Procedures (including critical care time) DIAGNOSTIC STUDIES: Oxygen Saturation is 100% on RA,  normal by my interpretation.    COORDINATION OF CARE: 3:40 AM Discussed treatment plan which includes lab work, CXR, EKG, and admission to the hospital with pt at bedside and pt agreed to plan.  Labs Review Labs Reviewed  BASIC METABOLIC PANEL - Abnormal; Notable for the following:    Potassium 3.1 (*)    Chloride 96 (*)    Glucose, Bld 180 (*)    All other components within normal limits  CBC - Abnormal; Notable for the following:    WBC 10.8 (*)    RBC 5.40 (*)    MCV 71.9 (*)    MCH 23.1 (*)    RDW 15.7 (*)    All other  components within normal limits  D-DIMER, QUANTITATIVE (NOT AT [facility])  Rosezena Sensor, ED    Imaging Review Dg Chest 2 View  01/10/2016  CLINICAL DATA:  Carolyn Phillips left-sided chest pain and nausea. EXAM: CHEST  2 VIEW COMPARISON:  11/03/2014 FINDINGS: There is stable right hemidiaphragm elevation. The lungs are clear. There is no pleural effusion. Hilar, mediastinal and cardiac contours are unremarkable and unchanged. Pulmonary vasculature is normal. IMPRESSION: No active cardiopulmonary disease. Electronically Signed   By: Ellery Plunk M.D.   On: 01/10/2016 22:16   I have personally reviewed and evaluated these images and lab results as part of my medical decision-making.   EKG Interpretation   Date/Time:  Wednesday January 10 2016 21:35:04 EDT Ventricular Rate:  92 PR Interval:  176 QRS Duration: 82 QT Interval:  352 QTC Calculation: 435 R Axis:   53 Text Interpretation:  Normal sinus rhythm Normal ECG slight ST  depression/T wave inversion inferiorly Reconfirmed by Tyshawna Alarid  MD, Kaliann Coryell  ([id number]) on 01/11/2016 3:40:04 AM      MDM   Final diagnoses:  Chest pain, unspecified chest pain type    Patient presents with chest pain. She's currently pain-free. Her EKG shows some slight ST depression, T-wave inversion inferiorly  as compared to her prior EKGs. Her troponin is negative. Her d-dimer is negative and she has no other suggestions of PE. Her chest x-ray is clear without evidence of pneumonia or pneumothorax. She has several risk factors for coronary artery disease. I spoke with Dr. Katrinka Blazing with the hospitalist service to admit the patient for further cardiac evaluation. She was admitted to an observation bed, telemetry.  I personally performed the services described in this documentation, which was scribed in my presence.  The recorded information has been reviewed and considered.    Rolan Bucco, MD 01/11/16 [phone]

## 2016-01-11 NOTE — Progress Notes (Signed)
  1- day Nuclear Stress Test performed, images and final report pending. Being read by [facility].  Signed, Ellsworth LennoxBrittany M Broxton Broady, PA-C 01/11/2016, 1:18 PM Pager: [phone]

## 2016-01-11 NOTE — ED Notes (Signed)
Phlebotomy at the bedside  

## 2016-01-11 NOTE — Consult Note (Signed)
CARDIOLOGY CONSULT NOTE   Patient ID: Carolyn Phillips MRN: [id number] DOB/AGE: [birth year] 42 y.o.  Admit date: 01/11/2016  Primary Physician   [facility], MD Primary Cardiologist   Dr Ladona Ridgel, last o.v. 06/2015 Reason for Consultation   Chest pain  WUJ:[id number] Daphne Karrer is a 40 y.o. year old female with a history of WPW w/ ablation, HTN, HLD, DM, GERD, obesity. She sometimes has HA when her BP is high.  She was admitted with chest pain and cardiology was asked to evaluate her.   She had just eaten a fruit cocktail, when she had onset of sharp chest pain in the sternal area. Each pain would be brief, but they kept coming. 9/10, did not make her SOB, made her nauseated, no vomiting. No real change with deep inspiration. She was concerned about the pain and came to the ER.   She got ASA and Zofran, her pain resolved, but she is not sure the meds helped. She has continued to have recurrent pain at times, no clear cause. A GI cocktail may be helping. She admits to some anxiety about the pain, because of her family history, but denies anxiety at baseline.   When she is not having the pain, she will rest comfortably. She was able to take the K+ pills, but had pain shortly after that.   Past Medical History  Diagnosis Date  . Wolff-Parkinson-White syndrome   . Hypertension   . Hyperlipemia   . Diabetes mellitus without complication (HCC)   . GERD (gastroesophageal reflux disease)   . Obesity   . Complication of anesthesia   . PONV (postoperative nausea and vomiting)   . Beta thalassemia trait   . Dysrhythmia     WPW-ablation  . Headache      Past Surgical History  Procedure Laterality Date  . Abdominal hysterectomy    . Bladder repair    . Breast reduction surgery    . Tubal ligation    . Supraventricular tachycardia ablation N/A 07/04/2014    Procedure: WPW ABLATION;  Surgeon: Marinus Maw, MD;  Location: [facility] CATH LAB;  Service: Cardiovascular;   Laterality: N/A;  . Knee arthroscopy with drilling/microfracture Left 09/13/2015    Procedure: KNEE ARTHROSCOPY WITH DRILLING/MICROFRACTURE;  Surgeon: Gean Birchwood, MD;  Location: [facility];  Service: Orthopedics;  Laterality: Left;  . Chondroplasty Left 09/13/2015    Procedure: CHONDROPLASTY;  Surgeon: Gean Birchwood, MD;  Location: [facility];  Service: Orthopedics;  Laterality: Left;    Allergies  Allergen Reactions  . Penicillins Hives and Swelling    Has patient had a PCN reaction causing immediate rash, facial/tongue/throat swelling, SOB or lightheadedness with hypotension: Yes Has patient had a PCN reaction causing severe rash involving mucus membranes or skin necrosis: No Has patient had a PCN reaction that required hospitalization No Has patient had a PCN reaction occurring within the last 10 years: No If all of the above answers are "NO", then may proceed with Cephalosporin use.   Eye swelling  . Metoprolol     "LOL" Drugs cause hair to fall out  . Other Nausea And Vomiting    General Anesthesia     I have reviewed the patient's current medications . enoxaparin (LOVENOX) injection  40 mg Subcutaneous Q24H  . hydrochlorothiazide  25 mg Oral Daily  . insulin aspart  0-9 Units Subcutaneous Q6H  . [START ON 01/12/2016] rosuvastatin  5 mg Oral Once per day on Mon Wed Fri  acetaminophen, benzonatate, diazepam, gi cocktail, ibuprofen, loratadine, morphine injection, ondansetron (ZOFRAN) IV, traMADol  Prior to Admission medications   Medication Sig Start Date End Date Taking? Authorizing Provider  albuterol (PROVENTIL HFA;VENTOLIN HFA) 108 (90 Base) MCG/ACT inhaler Inhale 1-2 puffs into the lungs every 6 (six) hours as needed for wheezing or shortness of breath.   Yes Historical Provider, MD  benzonatate (TESSALON) 100 MG capsule Take 100 mg by mouth 3 (three) times daily as needed for cough.   Yes Historical Provider, MD  Biotin 2500 MCG CAPS Take  2 capsules by mouth daily.   Yes Historical Provider, MD  Cholecalciferol (VITAMIN D3) 3000 UNITS TABS Take 3,000 Units by mouth daily.   Yes Historical Provider, MD  Cyanocobalamin (VITAMIN B 12 PO) Take 1 tablet by mouth daily.   Yes Historical Provider, MD  diazepam (VALIUM) 5 MG tablet Take 1 tablet (5 mg total) by mouth every 12 (twelve) hours as needed for anxiety (vertigo). 09/19/15  Yes Eber Hong, MD  hydrochlorothiazide (HYDRODIURIL) 25 MG tablet Take 25 mg by mouth daily.    Yes Historical Provider, MD  loratadine (CLARITIN) 10 MG tablet Take 10 mg by mouth daily as needed for allergies.   Yes Historical Provider, MD  meclizine (ANTIVERT) 25 MG tablet Take 1 tablet (25 mg total) by mouth 3 (three) times daily as needed for dizziness. 09/19/15  Yes Eber Hong, MD  Multiple Vitamins-Minerals (ZINC PO) Take 1 tablet by mouth daily.   Yes Historical Provider, MD  ondansetron (ZOFRAN ODT) 4 MG disintegrating tablet Take 1 tablet (4 mg total) by mouth every 8 (eight) hours as needed for nausea. 09/19/15  Yes Eber Hong, MD  Probiotic Product (ALIGN) 4 MG CAPS Take 1 capsule by mouth daily.    Yes Historical Provider, MD  rosuvastatin (CRESTOR) 5 MG tablet Take 5 mg by mouth 3 (three) times a week.   Yes Historical Provider, MD  sitaGLIPtin-metformin (JANUMET) 50-1000 MG tablet Take 2 tablets by mouth every evening.   Yes Historical Provider, MD  traMADol (ULTRAM) 50 MG tablet Take 50 mg by mouth every 6 (six) hours as needed for moderate pain.    Yes Historical Provider, MD  VITAMIN A PO Take 1 tablet by mouth daily.   Yes Historical Provider, MD     Social History   Social History  . Marital Status: Legally Separated    Spouse Name: N/A  . Number of Children: N/A  . Years of Education: N/A   Occupational History  . 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 Smoking status: Never Smoker   . Smokeless tobacco: Never Used     Comment: "only smoked for one  year al ong time ago "  . Alcohol Use: 0.0 oz/week    0 Standard drinks or equivalent per week     Comment: occasional  . Drug Use: No  . Sexual Activity: Not on file   Other Topics Concern  . Not on file   Social History Narrative   Pt lives with mother, children and husband.    Family Status  Relation Status Death Age  . Mother Alive   . Father Alive    Family History  Problem Relation Age of Onset  . Valvular heart disease Mother      ROS:  Full 14 point review of systems complete and found to be negative unless listed above.  Physical Exam: Blood pressure 137/83, pulse 73, temperature 98.4 F (  36.9 C), temperature source Oral, resp. rate 16, height 5\' 2"  (1.575 m), weight 212 lb (96.163 kg), SpO2 99 %.  General: Well developed, well nourished, female in no acute distress Head: Eyes PERRLA, No xanthomas.   Normocephalic and atraumatic, oropharynx without edema or exudate. Dentition: good Lungs: CTA bilaterally Heart: HRRR S1 S2, no rub/gallop, soft murmur. pulses are 2+ all 4 extrem.   Neck: No carotid bruits. No lymphadenopathy.  JVD not elevated. Abdomen: Bowel sounds present, abdomen soft and non-tender without masses or hernias noted. Msk:  No spine or cva tenderness. No weakness, no joint deformities or effusions. Extremities: No clubbing or cyanosis. No edema.  Neuro: Alert and oriented X 3. No focal deficits noted. Psych:  Good affect, responds appropriately Skin: No rashes or lesions noted.  Labs:   Lab Results  Component Value Date   WBC 10.8* 01/10/2016   HGB 12.5 01/10/2016   HCT 38.8 01/10/2016   MCV 71.9* 01/10/2016   PLT 321 01/10/2016     Recent Labs Lab 01/10/16 2144  NA 135  K 3.1*  CL 96*  CO2 28  BUN 10  CREATININE 0.88  CALCIUM 9.7  GLUCOSE 180*    Recent Labs  01/11/16 0608 01/11/16 0803  TROPONINI <0.03 <0.03    Recent Labs  01/10/16 2153  TROPIPOC 0.00   Lab Results  Component Value Date   CHOL 197 01/11/2016    HDL 42 01/11/2016   LDLCALC 132* 01/11/2016   TRIG 114 01/11/2016   Lab Results  Component Value Date   DDIMER 0.29 01/11/2016    ECG:  SR, minor inferior T wave changes from 06/2015, unclear significance  Radiology:  Dg Chest 2 View  01/10/2016  CLINICAL DATA:  Lambert ModySharp left-sided chest pain and nausea. EXAM: CHEST  2 VIEW COMPARISON:  11/03/2014 FINDINGS: There is stable right hemidiaphragm elevation. The lungs are clear. There is no pleural effusion. Hilar, mediastinal and cardiac contours are unremarkable and unchanged. Pulmonary vasculature is normal. IMPRESSION: No active cardiopulmonary disease. Electronically Signed   By: Ellery Plunkaniel R Mitchell M.D.   On: 01/10/2016 22:16    ASSESSMENT AND PLAN:   The patient was seen today by Dr Myrtis SerKatz, the patient evaluated and the data reviewed.   1. Chest pain: - symptoms are atypical, but she has multiple CRFs and is concerned about the symptoms - ez neg and ECG not acute - feel she would benefit from a MV to assess for ischemia, will order for today. - if cannot get target HR on treadmill, Lexiscan is OK.  Otherwise, per IM Principal Problem:   Chest pain Active Problems:   Hypertension   Diabetes mellitus (HCC)   Dyslipidemia   Hypokalemia   Signed: Leanna BattlesBarrett, Rhonda, PA-C 01/11/2016 9:29 AM Beeper [phone]  Patient seen and examined. I agree with the assessment and plan as detailed above. See also my additional thoughts below.   I reviewed all data with Theodore Demarkhonda Barrett PA-C. I agree with the note and plan above. We will proceed with nuclear stress in the hospital today.   Willa RoughJeffrey Ladeana Laplant, MD, [facility] 01/11/2016 11:55 AM

## 2016-01-16 ENCOUNTER — Ambulatory Visit (HOSPITAL_COMMUNITY)
Admission: EM | Admit: 2016-01-16 | Discharge: 2016-01-16 | Disposition: A | Discharge status: Home / Self Care | Payer: BLUE CROSS/BLUE SHIELD | Attending: Family Medicine | Admitting: Family Medicine

## 2016-01-16 ENCOUNTER — Encounter (HOSPITAL_COMMUNITY): Payer: Self-pay | Admitting: Emergency Medicine

## 2016-01-16 DIAGNOSIS — R6889 Other general symptoms and signs: Secondary | ICD-10-CM

## 2016-01-16 MED ORDER — IBUPROFEN 800 MG PO TABS
800.0000 mg | ORAL_TABLET | Freq: Once | ORAL | Status: AC
  Administered 2016-01-16: 800 mg via ORAL

## 2016-01-16 MED ORDER — OSELTAMIVIR PHOSPHATE 75 MG PO CAPS: 75.0000 mg | ORAL_CAPSULE | Freq: Two times a day (BID) | ORAL | Status: DC

## 2016-01-16 MED ORDER — IBUPROFEN 800 MG PO TABS: 800.0000 mg | ORAL_TABLET | Freq: Once | ORAL | Status: DC

## 2016-01-16 MED ORDER — IBUPROFEN 800 MG PO TABS
ORAL_TABLET | ORAL | Status: AC
  Filled 2016-01-16: qty 1

## 2016-01-16 NOTE — ED Notes (Signed)
PT reports she was admitted last week for chest pain. PT had a full workup and was released with no restrictions. PT woke up with post nasal drip and fever yesterday. PT saw her PCP and had a negative rapid strep swab. PT's temp was 99.7 yesterday. PT woke up with fever, body aches, and continued post nasal drip. PT took tylenol at 6am and just before coming in. PT reports her fever keeps increasing and she is feeling worse and worse.

## 2016-01-16 NOTE — ED Provider Notes (Signed)
CSN: [id number]     Arrival date & time 01/16/16  1555 History   First MD Initiated Contact with Patient 01/16/16 1715     Chief Complaint  Patient presents with  . Fever   (Consider location/radiation/quality/duration/timing/severity/associated sxs/prior Treatment) HPI Pt presents with sore throat, body aches, fever, chills for 1-2 days Home treatment has been OTC meds without much relief of symptoms Fever is improved for short periods of time with OTC antipyretics. Pain score is 4 mostly from coughing and body aches Taking fluids, no appetite No flu shot Has been exposed to others with similar symptoms.  Denies: CP, SOB, vomiting or diarrhea.  Past Medical History  Diagnosis Date  . Wolff-Parkinson-White syndrome   . Hypertension   . Hyperlipemia   . Diabetes mellitus without complication (HCC)   . GERD (gastroesophageal reflux disease)   . Obesity   . Complication of anesthesia   . PONV (postoperative nausea and vomiting)   . Beta thalassemia trait   . Dysrhythmia     WPW-ablation  . Headache    Past Surgical History  Procedure Laterality Date  . Abdominal hysterectomy    . Bladder repair    . Breast reduction surgery    . Tubal ligation    . Supraventricular tachycardia ablation N/A 07/04/2014    Procedure: WPW ABLATION;  Surgeon: Marinus MawGregg W Taylor, MD;  [facility];  Service: Cardiovascular;  Laterality: N/A;  . Knee arthroscopy with drilling/microfracture Left 09/13/2015    Procedure: KNEE ARTHROSCOPY WITH DRILLING/MICROFRACTURE;  Surgeon: Gean BirchwoodFrank Rowan, MD;  Location: [facility];  Service: Orthopedics;  Laterality: Left;  . Chondroplasty Left 09/13/2015    Procedure: CHONDROPLASTY;  Surgeon: Gean BirchwoodFrank Rowan, MD;  Location: [facility];  Service: Orthopedics;  Laterality: Left;   Family History  Problem Relation Age of Onset  . Valvular heart disease Mother     Aortic valve replacement  . Heart attack Maternal Grandmother     50s   . Stroke Maternal Grandmother    Social History  Substance Use Topics  . Smoking status: Never Smoker   . Smokeless tobacco: Never Used     Comment: "only smoked for one year al ong time ago "  . Alcohol Use: 0.0 oz/week    0 Standard drinks or equivalent per week     Comment: occasional   OB History    No data available     Review of Systems See HPI Allergies  Penicillins; Metoprolol; and Other  Home Medications   Prior to Admission medications   Medication Sig Start Date End Date Taking? Authorizing Provider  benzonatate (TESSALON) 100 MG capsule Take 100 mg by mouth 3 (three) times daily as needed for cough.   Yes Historical Provider, MD  Biotin 2500 MCG CAPS Take 2 capsules by mouth daily.   Yes Historical Provider, MD  Cholecalciferol (VITAMIN D3) 3000 UNITS TABS Take 3,000 Units by mouth daily.   Yes Historical Provider, MD  Cyanocobalamin (VITAMIN B 12 PO) Take 1 tablet by mouth daily.   Yes Historical Provider, MD  diazepam (VALIUM) 5 MG tablet Take 1 tablet (5 mg total) by mouth every 12 (twelve) hours as needed for anxiety (vertigo). 09/19/15  Yes Eber HongBrian Miller, MD  hydrochlorothiazide (HYDRODIURIL) 25 MG tablet Take 25 mg by mouth daily.    Yes Historical Provider, MD  loratadine (CLARITIN) 10 MG tablet Take 10 mg by mouth daily as needed for allergies.   Yes Historical Provider, MD  rosuvastatin (  CRESTOR) 5 MG tablet Take 5 mg by mouth daily.    Yes Historical Provider, MD  sitaGLIPtin-metformin (JANUMET) 50-1000 MG tablet Take 2 tablets by mouth every evening.   Yes Historical Provider, MD  traMADol (ULTRAM) 50 MG tablet Take 50 mg by mouth every 6 (six) hours as needed for moderate pain.    Yes Historical Provider, MD  albuterol (PROVENTIL HFA;VENTOLIN HFA) 108 (90 Base) MCG/ACT inhaler Inhale 1-2 puffs into the lungs every 6 (six) hours as needed for wheezing or shortness of breath.    Historical Provider, MD  meclizine (ANTIVERT) 25 MG tablet Take 1 tablet (25 mg  total) by mouth 3 (three) times daily as needed for dizziness. 09/19/15   Eber Hong, MD  Multiple Vitamins-Minerals (ZINC PO) Take 1 tablet by mouth daily.    Historical Provider, MD  ondansetron (ZOFRAN ODT) 4 MG disintegrating tablet Take 1 tablet (4 mg total) by mouth every 8 (eight) hours as needed for nausea. 09/19/15   Eber Hong, MD  oseltamivir (TAMIFLU) 75 MG capsule Take 1 capsule (75 mg total) by mouth every 12 (twelve) hours. 01/16/16   Tharon Aquas, PA  Probiotic Product (ALIGN) 4 MG CAPS Take 1 capsule by mouth daily.     Historical Provider, MD  VITAMIN A PO Take 1 tablet by mouth daily.    Historical Provider, MD   Meds Ordered and Administered this Visit   Medications  ibuprofen (ADVIL,MOTRIN) tablet 800 mg (800 mg Oral Given 01/16/16 1728)    BP 135/89 mmHg  Pulse 109  Temp(Src) 102.3 F (39.1 C) (Temporal)  Resp 16  SpO2 99% No data found.   Physical Exam NURSES NOTES AND VITAL SIGNS REVIEWED. CONSTITUTIONAL: Well developed, well nourished, no acute distress HEENT: normocephalic, atraumatic, right and left TM's are normal EYES: Conjunctiva normal NECK:normal ROM, supple, no adenopathy PULMONARY:No respiratory distress, normal effort, Lungs: CTAb/l, no wheezes, or increased work of breathing CARDIOVASCULAR: RRR, no murmur ABDOMEN: soft, ND, NT, +'ve BS MUSCULOSKELETAL: Normal ROM of all extremities,  SKIN: warm and dry without rash PSYCHIATRIC: Mood and affect, behavior are normal  ED Course  Procedures (including critical care time)  Labs Review Labs Reviewed - No data to display  Imaging Review No results found.   Visual Acuity Review  Right Eye Distance:   Left Eye Distance:   Bilateral Distance:    Right Eye Near:   Left Eye Near:    Bilateral Near:     RX Tamiflu  Work note provided.  MDM   1. Flu-like symptoms     Patient is reassured that there are no issues that require transfer to higher level of care at this time or  additional tests. Patient is advised to continue home symptomatic treatment. Patient is advised that if there are new or worsening symptoms to attend the emergency department, contact primary care provider, or return to UC. Instructions of care provided discharged home in stable condition.    THIS NOTE WAS GENERATED USING A VOICE RECOGNITION SOFTWARE PROGRAM. ALL REASONABLE EFFORTS  WERE MADE TO PROOFREAD THIS DOCUMENT FOR ACCURACY.  I have verbally reviewed the discharge instructions with the patient. A printed AVS was given to the patient.  All questions were answered prior to discharge.      Tharon Aquas, PA 01/16/16 1754

## 2016-01-16 NOTE — Discharge Instructions (Signed)

## 2016-01-29 ENCOUNTER — Ambulatory Visit: Payer: BLUE CROSS/BLUE SHIELD

## 2016-01-31 ENCOUNTER — Ambulatory Visit: Payer: BLUE CROSS/BLUE SHIELD

## 2016-01-31 ENCOUNTER — Other Ambulatory Visit: Payer: Self-pay

## 2016-01-31 ENCOUNTER — Ambulatory Visit
Admission: RE | Admit: 2016-01-31 | Discharge: 2016-01-31 | Disposition: A | Discharge status: Home / Self Care | Payer: BLUE CROSS/BLUE SHIELD | Source: Ambulatory Visit

## 2016-01-31 DIAGNOSIS — Z1231 Encounter for screening mammogram for malignant neoplasm of breast: Secondary | ICD-10-CM

## 2016-02-16 ENCOUNTER — Ambulatory Visit (INDEPENDENT_AMBULATORY_CARE_PROVIDER_SITE_OTHER): Payer: BLUE CROSS/BLUE SHIELD | Admitting: Endocrinology

## 2016-02-16 ENCOUNTER — Encounter: Payer: Self-pay | Admitting: Endocrinology

## 2016-02-16 VITALS — BP 118/82 | HR 78 | Temp 98.9°F | Wt 206.0 lb

## 2016-02-16 DIAGNOSIS — E785 Hyperlipidemia, unspecified: Secondary | ICD-10-CM

## 2016-02-16 DIAGNOSIS — E119 Type 2 diabetes mellitus without complications: Secondary | ICD-10-CM | POA: Diagnosis not present

## 2016-02-16 DIAGNOSIS — E118 Type 2 diabetes mellitus with unspecified complications: Secondary | ICD-10-CM | POA: Diagnosis not present

## 2016-02-16 MED ORDER — SITAGLIP PHOS-METFORMIN HCL ER 50-1000 MG PO TB24: 2.0000 | ORAL_TABLET | Freq: Every day | ORAL | Status: DC

## 2016-02-16 MED ORDER — CANAGLIFLOZIN 300 MG PO TABS: 300.0000 mg | ORAL_TABLET | Freq: Every day | ORAL | Status: DC

## 2016-02-16 NOTE — Progress Notes (Signed)
Subjective:    Patient ID: Carolyn Phillips, female    DOB: 1976-07-25, 40 y.o.   MRN: [id number]  HPI pt states DM was dx'ed in 2013; he has mild if any neuropathy of the lower extremities; she is unaware of any associated chronic complications; she has never been on insulin; pt says his diet and exercise are fair; she has never had GDM, pancreatitis, severe hypoglycemia or DKA.  She says cbg's are in the mid-100's.  She recently took steroids for exac of allergic rhinitis.   Past Medical History  Diagnosis Date  . Wolff-Parkinson-White syndrome   . Hypertension   . Hyperlipemia   . Diabetes mellitus without complication (HCC)   . GERD (gastroesophageal reflux disease)   . Obesity   . Complication of anesthesia   . PONV (postoperative nausea and vomiting)   . Beta thalassemia trait   . Dysrhythmia     WPW-ablation  . Headache     Past Surgical History  Procedure Laterality Date  . Abdominal hysterectomy    . Bladder repair    . Breast reduction surgery    . Tubal ligation    . Supraventricular tachycardia ablation N/A 07/04/2014    Procedure: WPW ABLATION;  Surgeon: Marinus MawGregg W Taylor, MD;  [facility];  Service: Cardiovascular;  Laterality: N/A;  . Knee arthroscopy with drilling/microfracture Left 09/13/2015    Procedure: KNEE ARTHROSCOPY WITH DRILLING/MICROFRACTURE;  Surgeon: Gean BirchwoodFrank Rowan, MD;  Location: [facility];  Service: Orthopedics;  Laterality: Left;  . Chondroplasty Left 09/13/2015    Procedure: CHONDROPLASTY;  Surgeon: Gean BirchwoodFrank Rowan, MD;  Location: [facility];  Service: Orthopedics;  Laterality: Left;    Social History   Social History  . Marital Status: Legally Separated    Spouse Name: N/A  . Number of Children: N/A  . Years of Education: N/A   Occupational History  . 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 Smoking status: Never Smoker   . Smokeless tobacco: Never Used     Comment: "only  smoked for one year al ong time ago "  . Alcohol Use: 0.0 oz/week    0 Standard drinks or equivalent per week     Comment: occasional  . Drug Use: No  . Sexual Activity: Not on file   Other Topics Concern  . Not on file   Social History Narrative   Pt lives with mother, children and husband.    Current Outpatient Prescriptions on File Prior to Visit  Medication Sig Dispense Refill  . albuterol (PROVENTIL HFA;VENTOLIN HFA) 108 (90 Base) MCG/ACT inhaler Inhale 1-2 puffs into the lungs every 6 (six) hours as needed for wheezing or shortness of breath.    . benzonatate (TESSALON) 100 MG capsule Take 100 mg by mouth 3 (three) times daily as needed for cough.    . Biotin 2500 MCG CAPS Take 2 capsules by mouth daily.    . Cholecalciferol (VITAMIN D3) 3000 UNITS TABS Take 3,000 Units by mouth daily.    . Cyanocobalamin (VITAMIN B 12 PO) Take 1 tablet by mouth daily.    . diazepam (VALIUM) 5 MG tablet Take 1 tablet (5 mg total) by mouth every 12 (twelve) hours as needed for anxiety (vertigo). 10 tablet 0  . hydrochlorothiazide (HYDRODIURIL) 25 MG tablet Take 12.5 mg by mouth daily.     . meclizine (ANTIVERT) 25 MG tablet Take 1 tablet (25 mg total) by mouth 3 (three)  times daily as needed for dizziness. 30 tablet 0  . Multiple Vitamins-Minerals (ZINC PO) Take 1 tablet by mouth daily.    . ondansetron (ZOFRAN ODT) 4 MG disintegrating tablet Take 1 tablet (4 mg total) by mouth every 8 (eight) hours as needed for nausea. 10 tablet 0  . Probiotic Product (ALIGN) 4 MG CAPS Take 1 capsule by mouth daily.     . rosuvastatin (CRESTOR) 5 MG tablet Take 5 mg by mouth daily.     . traMADol (ULTRAM) 50 MG tablet Take 50 mg by mouth every 6 (six) hours as needed for moderate pain.     Marland Kitchen VITAMIN A PO Take 1 tablet by mouth daily.     No current facility-administered medications on file prior to visit.    Allergies  Allergen Reactions  . Penicillins Hives and Swelling    Has patient had a PCN reaction  causing immediate rash, facial/tongue/throat swelling, SOB or lightheadedness with hypotension: Yes Has patient had a PCN reaction causing severe rash involving mucus membranes or skin necrosis: No Has patient had a PCN reaction that required hospitalization No Has patient had a PCN reaction occurring within the last 10 years: No If all of the above answers are "NO", then may proceed with Cephalosporin use.   Eye swelling  . Metoprolol     "LOL" Drugs cause hair to fall out  . Other Nausea And Vomiting    General Anesthesia     Family History  Problem Relation Age of Onset  . Valvular heart disease Mother     Aortic valve replacement  . Heart attack Maternal Grandmother     50s  . Stroke Maternal Grandmother   . Diabetes Mother   . Diabetes Sister   . Diabetes Brother     BP 118/82 mmHg  Pulse 78  Temp(Src) 98.9 F (37.2 C) (Oral)  Wt 206 lb (93.441 kg)  SpO2 97%  Review of Systems denies blurry vision, headache, chest pain, sob, n/v, urinary frequency, excessive diaphoresis, depression, rhinorrhea, and easy bruising.  She is losing weight, due to her efforts.  She has intermittent leg cramps and cold intolerance.    Objective:   Physical Exam VS: see vs page GEN: no distress HEAD: head: no deformity eyes: no periorbital swelling, no proptosis external nose and ears are normal mouth: no lesion seen NECK: supple, thyroid is not enlarged CHEST WALL: no deformity LUNGS: clear to auscultation CV: reg rate and rhythm, no murmur ABD: abdomen is soft, nontender.  no hepatosplenomegaly.  not distended.  no hernia MUSCULOSKELETAL: muscle bulk and strength are grossly normal.  no obvious joint swelling.  gait is normal and steady EXTEMITIES: no deformity.  no ulcer on the feet.  feet are of normal color and temp.  no edema PULSES: dorsalis pedis intact bilat.  no carotid bruit NEURO:  cn 2-12 grossly intact.   readily moves all 4's.  sensation is intact to touch on the  feet SKIN:  Normal texture and temperature.  No rash or suspicious lesion is visible.   NODES:  None palpable at the neck PSYCH: alert, well-oriented.  Does not appear anxious nor depressed.    outside test results are reviewed:  A1c=7.4%  i personally reviewed electrocardiogram tracing (01/10/16): Indication: WPW Impression: normal    Assessment & Plan:  DM: she needs increased rx, if it can be done with a regimen that avoids or minimizes hypoglycemia.   Diarrhea, prob due to metformin.  Vertigo: this is a  relative contraindication to bromocriptine.   HTN: she can tolerate reduction of HCTZ.   Patient is advised the following: Patient Instructions  good diet and exercise significantly improve the control of your diabetes.  please let me know if you wish to be referred to a dietician.  high blood sugar is very risky to your health.  you should see an eye doctor and dentist every year.  It is very important to get all recommended vaccinations.  controlling your blood pressure and cholesterol drastically reduces the damage diabetes does to your body.  Those who smoke should quit.  please discuss these with your doctor.  check your blood sugar once a day.  vary the time of day when you check, between before the 3 meals, and at bedtime.  also check if you have symptoms of your blood sugar being too high or too low.  please keep a record of the readings and bring it to your next appointment here (or you can bring the meter itself).  You can write it on any piece of paper.  please call us sooner if your blood sugar goes below 70, or if you have a lot of readings over 200. i have sent a prescription to your pharmacy, to change the janumet to time-release, to see if that helps the diarrhea. i have also sent a prescription to your pharmacy, to add "invokana."  Please also reduce the HCTZ to 1/2 pill per day, as the invokana is a diuretic also. Please come to the lab in approx 3 weeks, for blood  tests.  Please come back for a follow-up appointment in 4 months.

## 2016-02-16 NOTE — Patient Instructions (Addendum)
good diet and exercise significantly improve the control of your diabetes.  please let me know if you wish to be referred to a dietician.  high blood sugar is very risky to your health.  you should see an eye doctor and dentist every year.  It is very important to get all recommended vaccinations.  controlling your blood pressure and cholesterol drastically reduces the damage diabetes does to your body.  Those who smoke should quit.  please discuss these with your doctor.  check your blood sugar once a day.  vary the time of day when you check, between before the 3 meals, and at bedtime.  also check if you have symptoms of your blood sugar being too high or too low.  please keep a record of the readings and bring it to your next appointment here (or you can bring the meter itself).  You can write it on any piece of paper.  please call us sooner if your blood sugar goes below 70, or if you have a lot of readings over 200. i have sent a prescription to your pharmacy, to change the janumet to time-release, to see if that helps the diarrhea. i have also sent a prescription to your pharmacy, to add "invokana."  Please also reduce the HCTZ to 1/2 pill per day, as the invokana is a diuretic also. Please come to the lab in approx 3 weeks, for blood tests.  Please come back for a follow-up appointment in 4 months.

## 2016-02-16 NOTE — Progress Notes (Signed)
Pre visit review using our clinic review tool, if applicable. No additional management support is needed unless otherwise documented below in the visit note. 

## 2016-03-01 ENCOUNTER — Other Ambulatory Visit (INDEPENDENT_AMBULATORY_CARE_PROVIDER_SITE_OTHER): Payer: BLUE CROSS/BLUE SHIELD

## 2016-03-01 DIAGNOSIS — E118 Type 2 diabetes mellitus with unspecified complications: Secondary | ICD-10-CM

## 2016-03-01 LAB — BASIC METABOLIC PANEL
BUN: 18 mg/dL (ref 6–23)
CO2: 28 mEq/L (ref 19–32)
CREATININE: 0.99 mg/dL (ref 0.40–1.20)
Calcium: 9.8 mg/dL (ref 8.4–10.5)
Chloride: 101 mEq/L (ref 96–112)
GFR: 79.26 mL/min (ref >60.00)
Glucose, Bld: 125 mg/dL — ABNORMAL HIGH (ref 70–99)
POTASSIUM: 3.6 meq/L (ref 3.5–5.1)
Sodium: 138 mEq/L (ref 135–145)

## 2016-03-02 LAB — FRUCTOSAMINE: FRUCTOSAMINE: 238 umol/L (ref 0–285)

## 2016-03-05 ENCOUNTER — Telehealth: Payer: Self-pay | Admitting: Endocrinology

## 2016-03-05 NOTE — Telephone Encounter (Signed)
Last office note faxed.

## 2016-03-05 NOTE — Telephone Encounter (Signed)
Carolyn Phillips from Dr Audria NineMcMillians' office would like the consult of patient last visit. Fax # [phone]

## 2016-05-06 DIAGNOSIS — M25562 Pain in left knee: Secondary | ICD-10-CM | POA: Diagnosis not present

## 2016-05-06 DIAGNOSIS — R202 Paresthesia of skin: Secondary | ICD-10-CM | POA: Diagnosis not present

## 2016-05-06 DIAGNOSIS — E1137X1 Type 2 diabetes mellitus with diabetic macular edema, resolved following treatment, right eye: Secondary | ICD-10-CM | POA: Diagnosis not present

## 2016-05-06 DIAGNOSIS — M542 Cervicalgia: Secondary | ICD-10-CM | POA: Diagnosis not present

## 2016-05-30 ENCOUNTER — Ambulatory Visit (INDEPENDENT_AMBULATORY_CARE_PROVIDER_SITE_OTHER): Payer: 59 | Admitting: Neurology

## 2016-05-30 ENCOUNTER — Encounter: Payer: Self-pay | Admitting: Neurology

## 2016-05-30 VITALS — BP 113/74 | HR 66 | Ht 62.0 in | Wt 199.6 lb

## 2016-05-30 DIAGNOSIS — M542 Cervicalgia: Secondary | ICD-10-CM | POA: Diagnosis not present

## 2016-05-30 DIAGNOSIS — G5603 Carpal tunnel syndrome, bilateral upper limbs: Secondary | ICD-10-CM | POA: Diagnosis not present

## 2016-05-30 DIAGNOSIS — G729 Myopathy, unspecified: Secondary | ICD-10-CM

## 2016-05-30 DIAGNOSIS — M6289 Other specified disorders of muscle: Secondary | ICD-10-CM

## 2016-05-30 DIAGNOSIS — R202 Paresthesia of skin: Secondary | ICD-10-CM | POA: Diagnosis not present

## 2016-05-30 NOTE — Patient Instructions (Signed)
Remember to drink plenty of fluid, eat healthy meals and do not skip any meals. Try to eat protein with a every meal and eat a healthy snack such as fruit or nuts in between meals. Try to keep a regular sleep-wake schedule and try to exercise daily, particularly in the form of walking, 20-30 minutes a day, if you can.   As far as diagnostic testing: EMG/NCS  [facility] for PT, acupuncture and massage  I would like to see you back for emg/ncs, sooner if we need to. Please call us with any interim questions, concerns, problems, updates or refill requests.   Our phone number is [phone]. We also have an after hours call service for urgent matters and there is a physician on-call for urgent questions. For any emergencies you know to call 911 or go to the nearest emergency room

## 2016-05-30 NOTE — Progress Notes (Signed)
[facility]    Provider:  Dr Lucia GaskinsAhern Referring Provider: Claudell KyleMcMillian, Traci, MD Primary Care Physician:  [facility]  CC:  Neck pain, back pain, arm pain, hand numbness and tingling.  HPI:  Carolyn Phillips is a 40 y.o. female here as a referral from Dr. Maryelizabeth RowanMcMillian for arm pain and numbness of hand . Past medical history of hypertension, diabetes, Wolff-Parkinson-White, hypercholesterolemia. She has had symptoms for a few months. She thought it was diabetic neuropathy and she has had foot tingling for a while especially in the morning but her hands are worsening and the feet are not so she was sent here. She started getting a bulge between her neck and shoulder blade she has a lot of tightness and she has tried TENs and heating pad, hand pain is worse when the muscle is tighter, worse on the right. She types all day which exacerbates her symptoms and is also a Consulting civil engineerstudent. She has tingling in the fingertips the pinky is the numbest. She has grip weakness, dropping items. She wakes up with pain, shakes hands out, worse with driving, better in certain positions, when driving she has to drops her hands and shakes them out. She feels like something hot is in her hand. She has neck pain but no radicular symptoms. Massage helps the neck and cervical muscle pain. No other associated symptoms. No known inciting events. No trauma to the hands.   Reviewed notes, labs and imaging from outside physicians, which showed:  CMP was normal except for glucose 125, creatinine normal 0.99. Hemoglobin A1c in April 2017 7.4.  Review of Systems: Patient complains of symptoms per HPI as well as the following symptoms: Weight loss, blurred vision, swelling in legs, ringing in ears, itching, moles, blurred vision, joint pain, joint swelling, aching muscles, headache, numbness, weakness, dizziness, snoring. Pertinent negatives per HPI. All others negative.   Social History   Social History  .  Marital status: Legally Separated    Spouse name: N/A  . Number of children: 2  . Years of education: College   Occupational History  . 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 Smoking status: Never Smoker  . Smokeless tobacco: Never Used     Comment: "only smoked for one year al ong time ago "  . Alcohol use 0.0 oz/week     Comment: occasional  . Drug use: No  . Sexual activity: Not on file   Other Topics Concern  . Not on file   Social History Narrative   Pt lives with mother, children and husband.   Caffeine use: small coffee per day   1 mountain dew per day    Family History  Problem Relation Age of Onset  . Valvular heart disease Mother     Aortic valve replacement  . Diabetes Mother   . Heart attack Maternal Grandmother     50s  . Stroke Maternal Grandmother   . Diabetes Sister   . Diabetes Brother     Past Medical History:  Diagnosis Date  . Beta thalassemia trait   . Complication of anesthesia   . Diabetes mellitus without complication (HCC)   . Dysrhythmia    WPW-ablation  . GERD (gastroesophageal reflux disease)   . Headache   . Hyperlipemia   . Hypertension   . Obesity   . PONV (postoperative nausea and vomiting)   . Wolff-Parkinson-White syndrome     Past Surgical History:  Procedure Laterality Date  .  ABDOMINAL HYSTERECTOMY    . BLADDER REPAIR    . BREAST REDUCTION SURGERY    . CHONDROPLASTY Left 09/13/2015   Procedure: CHONDROPLASTY;  Surgeon: Gean Birchwood, MD;  Location: [facility];  Service: Orthopedics;  Laterality: Left;  . KNEE ARTHROSCOPY WITH DRILLING/MICROFRACTURE Left 09/13/2015   Procedure: KNEE ARTHROSCOPY WITH DRILLING/MICROFRACTURE;  Surgeon: Gean Birchwood, MD;  Location:  SURGERY CENTER;  Service: Orthopedics;  Laterality: Left;  . SUPRAVENTRICULAR TACHYCARDIA ABLATION N/A 07/04/2014   Procedure: WPW ABLATION;  Surgeon: Marinus Maw, MD;  Location: [facility] CATH LAB;  Service:  Cardiovascular;  Laterality: N/A;  . TUBAL LIGATION      Current Outpatient Prescriptions  Medication Sig Dispense Refill  . albuterol (PROVENTIL HFA;VENTOLIN HFA) 108 (90 Base) MCG/ACT inhaler Inhale 1-2 puffs into the lungs every 6 (six) hours as needed for wheezing or shortness of breath.    . benzonatate (TESSALON) 100 MG capsule Take 100 mg by mouth 3 (three) times daily as needed for cough.    . Biotin 2500 MCG CAPS Take 2 capsules by mouth daily.    . canagliflozin (INVOKANA) 300 MG TABS tablet Take 1 tablet (300 mg total) by mouth daily. 30 tablet 11  . cetirizine (ZYRTEC) 10 MG tablet Take 10 mg by mouth daily.    . Cholecalciferol (VITAMIN D3) 3000 UNITS TABS Take 5,000 Units by mouth daily.     . Cyanocobalamin (VITAMIN B 12 PO) Take 1 tablet by mouth daily.    . diazepam (VALIUM) 5 MG tablet Take 1 tablet (5 mg total) by mouth every 12 (twelve) hours as needed for anxiety (vertigo). 10 tablet 0  . hydrochlorothiazide (HYDRODIURIL) 25 MG tablet Take 12.5 mg by mouth daily.     . meclizine (ANTIVERT) 25 MG tablet Take 1 tablet (25 mg total) by mouth 3 (three) times daily as needed for dizziness. 30 tablet 0  . methocarbamol (ROBAXIN) 500 MG tablet Take 500 mg by mouth as needed.  0  . Multiple Vitamins-Minerals (ZINC PO) Take 1 tablet by mouth daily.    . ondansetron (ZOFRAN ODT) 4 MG disintegrating tablet Take 1 tablet (4 mg total) by mouth every 8 (eight) hours as needed for nausea. 10 tablet 0  . Probiotic Product (ALIGN) 4 MG CAPS Take 1 capsule by mouth daily.     . rosuvastatin (CRESTOR) 5 MG tablet Take 5 mg by mouth daily.     . SitaGLIPtin-MetFORMIN HCl (JANUMET XR) 50-1000 MG TB24 Take 2 tablets by mouth daily. 60 tablet 11  . traMADol (ULTRAM) 50 MG tablet Take 50 mg by mouth every 6 (six) hours as needed for moderate pain.     Marland Kitchen VITAMIN A PO Take 1 tablet by mouth daily.     No current facility-administered medications for this visit.     Allergies as of 05/30/2016 -  Review Complete 02/16/2016  Allergen Reaction Noted  . Penicillins Hives and Swelling 10/09/2013  . Metoprolol  06/29/2014  . Other Nausea And Vomiting 06/29/2014    Vitals: BP 113/74 (BP Location: Right Arm, Patient Position: Sitting, Cuff Size: Large)   Pulse 66   Ht 5\' 2"  (1.575 m)   Wt 199 lb 9.6 oz (90.5 kg)   BMI 36.51 kg/m  Last Weight:  Wt Readings from Last 1 Encounters:  05/30/16 199 lb 9.6 oz (90.5 kg)   Last Height:   Ht Readings from Last 1 Encounters:  05/30/16 5\' 2"  (1.575 m)   Physical exam: Exam: Gen: NAD,  conversant, well nourised, obese, well groomed                     CV: RRR, no MRG. No Carotid Bruits. No peripheral edema, warm, nontender Eyes: Conjunctivae clear without exudates or hemorrhage  Neuro: Detailed Neurologic Exam  Speech:    Speech is normal; fluent and spontaneous with normal comprehension.  Cognition:    The patient is oriented to person, place, and time;     recent and remote memory intact;     language fluent;     normal attention, concentration,     fund of knowledge Cranial Nerves:    The pupils are equal, round, and reactive to light. The fundi are normal and spontaneous venous pulsations are present. Visual fields are full to finger confrontation. Extraocular movements are intact. Trigeminal sensation is intact and the muscles of mastication are normal. The face is symmetric. The palate elevates in the midline. Hearing intact. Voice is normal. Shoulder shrug is normal. The tongue has normal motion without fasciculations.   Coordination:    Normal finger to nose and heel to shin. Normal rapid alternating movements.   Gait:    Heel-toe and tandem gait are normal.   Motor Observation:    No asymmetry, no atrophy, and no involuntary movements noted. Tone:    Normal muscle tone.    Posture:    Posture is normal. normal erect    Strength:    Strength is V/V in the upper and lower limbs.      Sensation: intact to LT       Reflex Exam:  DTR's:    Deep tendon reflexes in the upper and lower extremities are normal bilaterally.   Toes:    The toes are downgoing bilaterally.   Clonus:    Clonus is absent.       Assessment/Plan:  40 year old here with neck pain, weakness and paresthesias of the hands  Musculoskeletal neck pain: Will refer to [facility] for massage/manual therapy, physical therapy and acupuncture for musculoskeletal neck pain and muscle strain. CTS: emg/ncs Will request report and/or CDs of the XR of the cervical spine  Cc: MCMILLIAN, Traci  Naomie Dean, MD  [facility] [address] [place], [place] [address]  Phone [phone] Fax [phone]

## 2016-05-31 DIAGNOSIS — R51 Headache: Secondary | ICD-10-CM | POA: Diagnosis not present

## 2016-05-31 DIAGNOSIS — M542 Cervicalgia: Secondary | ICD-10-CM | POA: Diagnosis not present

## 2016-05-31 DIAGNOSIS — G729 Myopathy, unspecified: Secondary | ICD-10-CM | POA: Diagnosis not present

## 2016-06-02 DIAGNOSIS — R202 Paresthesia of skin: Secondary | ICD-10-CM | POA: Insufficient documentation

## 2016-06-06 DIAGNOSIS — R51 Headache: Secondary | ICD-10-CM | POA: Diagnosis not present

## 2016-06-06 DIAGNOSIS — M542 Cervicalgia: Secondary | ICD-10-CM | POA: Diagnosis not present

## 2016-06-06 DIAGNOSIS — G729 Myopathy, unspecified: Secondary | ICD-10-CM | POA: Diagnosis not present

## 2016-06-06 MED FILL — INVOKANA 300 MG TABLET: 300 | 90 days supply | Qty: 90 | Fill #0

## 2016-06-13 DIAGNOSIS — G729 Myopathy, unspecified: Secondary | ICD-10-CM | POA: Diagnosis not present

## 2016-06-13 DIAGNOSIS — R51 Headache: Secondary | ICD-10-CM | POA: Diagnosis not present

## 2016-06-13 DIAGNOSIS — M542 Cervicalgia: Secondary | ICD-10-CM | POA: Diagnosis not present

## 2016-06-18 ENCOUNTER — Ambulatory Visit: Payer: BLUE CROSS/BLUE SHIELD | Admitting: Endocrinology

## 2016-06-19 ENCOUNTER — Encounter: Payer: 59 | Admitting: Neurology

## 2016-06-28 ENCOUNTER — Ambulatory Visit (INDEPENDENT_AMBULATORY_CARE_PROVIDER_SITE_OTHER): Payer: 59 | Admitting: Endocrinology

## 2016-06-28 ENCOUNTER — Encounter: Payer: Self-pay | Admitting: Endocrinology

## 2016-06-28 VITALS — BP 122/80 | HR 74 | Ht 62.0 in | Wt 201.0 lb

## 2016-06-28 DIAGNOSIS — M542 Cervicalgia: Secondary | ICD-10-CM | POA: Diagnosis not present

## 2016-06-28 DIAGNOSIS — R51 Headache: Secondary | ICD-10-CM | POA: Diagnosis not present

## 2016-06-28 DIAGNOSIS — G729 Myopathy, unspecified: Secondary | ICD-10-CM | POA: Diagnosis not present

## 2016-06-28 DIAGNOSIS — E118 Type 2 diabetes mellitus with unspecified complications: Secondary | ICD-10-CM

## 2016-06-28 LAB — URINALYSIS, ROUTINE W REFLEX MICROSCOPIC
BILIRUBIN URINE: NEGATIVE
KETONES UR: NEGATIVE
Leukocytes, UA: NEGATIVE
NITRITE: NEGATIVE
Specific Gravity, Urine: 1.02 (ref 1.000–1.030)
TOTAL PROTEIN, URINE-UPE24: NEGATIVE
UROBILINOGEN UA: 0.2 (ref 0.0–1.0)
pH: 5.5 (ref 5.0–8.0)

## 2016-06-28 LAB — MICROALBUMIN / CREATININE URINE RATIO
Creatinine,U: 110.9 mg/dL
Microalb Creat Ratio: 0.6 mg/g (ref 0.0–30.0)
Microalb, Ur: <0.7 mg/dL (ref 0.0–1.9)

## 2016-06-28 LAB — BASIC METABOLIC PANEL
BUN: 12 mg/dL (ref 6–23)
CHLORIDE: 98 meq/L (ref 96–112)
CO2: 34 mEq/L — ABNORMAL HIGH (ref 19–32)
Calcium: 9.7 mg/dL (ref 8.4–10.5)
Creatinine, Ser: 0.86 mg/dL (ref 0.40–1.20)
GFR: 93.09 mL/min (ref >60.00)
Glucose, Bld: 95 mg/dL (ref 70–99)
POTASSIUM: 3.6 meq/L (ref 3.5–5.1)
Sodium: 137 mEq/L (ref 135–145)

## 2016-06-28 LAB — POCT GLYCOSYLATED HEMOGLOBIN (HGB A1C): Hemoglobin A1C: 6.9

## 2016-06-28 MED ORDER — REPAGLINIDE 0.5 MG PO TABS: 0.5000 mg | ORAL_TABLET | Freq: Every day | ORAL | 3 refills | Status: DC

## 2016-06-28 MED FILL — REPAGLINIDE 0.5 MG TABLET: 0.5 | 90 days supply | Qty: 90 | Fill #0

## 2016-06-28 NOTE — Progress Notes (Signed)
Subjective:    Patient ID: Myles Gip, female    DOB: [birth year], 40 y.o.   MRN: [id number]  HPI Pt returns for f/u of diabetes mellitus: DM type: 2 Dx'ed: 2013 Complications: none Therapy: 3 oral meds GDM: never DKA: never Severe hypoglycemia: never Pancreatitis: never Other: she has never been on insulin; diarrhea, edema, and vertigo limit oral rx options.   Interval history: Pt states slight dry mouth.  She does not check cbg's.   Past Medical History:  Diagnosis Date  . Beta thalassemia trait   . Complication of anesthesia   . Diabetes mellitus without complication (HCC)   . Dysrhythmia    WPW-ablation  . GERD (gastroesophageal reflux disease)   . Headache   . Hyperlipemia   . Hypertension   . Obesity   . PONV (postoperative nausea and vomiting)   . Wolff-Parkinson-White syndrome     Past Surgical History:  Procedure Laterality Date  . ABDOMINAL HYSTERECTOMY    . BLADDER REPAIR    . BREAST REDUCTION SURGERY    . CHONDROPLASTY Left 09/13/2015   Procedure: CHONDROPLASTY;  Surgeon: Gean Birchwood, MD;  Location: [facility];  Service: Orthopedics;  Laterality: Left;  . KNEE ARTHROSCOPY WITH DRILLING/MICROFRACTURE Left 09/13/2015   Procedure: KNEE ARTHROSCOPY WITH DRILLING/MICROFRACTURE;  Surgeon: Gean Birchwood, MD;  Location: [facility];  Service: Orthopedics;  Laterality: Left;  . SUPRAVENTRICULAR TACHYCARDIA ABLATION N/A 07/04/2014   Procedure: WPW ABLATION;  Surgeon: Marinus Maw, MD;  Location: [facility] CATH LAB;  Service: Cardiovascular;  Laterality: N/A;  . TUBAL LIGATION      Social History   Social History  . Marital status: Legally Separated    Spouse name: N/A  . Number of children: 2  . Years of education: College   Occupational History  . 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 Smoking status: Never Smoker  . Smokeless tobacco: Never Used     Comment: "only smoked for one year al ong time  ago "  . Alcohol use 0.0 oz/week     Comment: occasional  . Drug use: No  . Sexual activity: Not on file   Other Topics Concern  . Not on file   Social History Narrative   Pt lives with mother, children and husband.   Caffeine use: small coffee per day   1 mountain dew per day    Current Outpatient Prescriptions on File Prior to Visit  Medication Sig Dispense Refill  . albuterol (PROVENTIL HFA;VENTOLIN HFA) 108 (90 Base) MCG/ACT inhaler Inhale 1-2 puffs into the lungs every 6 (six) hours as needed for wheezing or shortness of breath.    . Biotin 2500 MCG CAPS Take 2 capsules by mouth daily.    . canagliflozin (INVOKANA) 300 MG TABS tablet Take 1 tablet (300 mg total) by mouth daily. 30 tablet 11  . cetirizine (ZYRTEC) 10 MG tablet Take 10 mg by mouth daily.    . Cholecalciferol (VITAMIN D3) 3000 UNITS TABS Take 5,000 Units by mouth daily.     . Cyanocobalamin (VITAMIN B 12 PO) Take 1 tablet by mouth daily.    . diazepam (VALIUM) 5 MG tablet Take 1 tablet (5 mg total) by mouth every 12 (twelve) hours as needed for anxiety (vertigo). 10 tablet 0  . meclizine (ANTIVERT) 25 MG tablet Take 1 tablet (25 mg total) by mouth 3 (three) times daily as needed for dizziness. 30 tablet 0  . methocarbamol (  ROBAXIN) 500 MG tablet Take 500 mg by mouth as needed.  0  . Multiple Vitamins-Minerals (ZINC PO) Take 1 tablet by mouth daily.    . Probiotic Product (ALIGN) 4 MG CAPS Take 1 capsule by mouth daily.     . rosuvastatin (CRESTOR) 5 MG tablet Take 5 mg by mouth daily.     . SitaGLIPtin-MetFORMIN HCl (JANUMET XR) 50-1000 MG TB24 Take 2 tablets by mouth daily. 60 tablet 11  . VITAMIN A PO Take 1 tablet by mouth daily.     No current facility-administered medications on file prior to visit.     Allergies  Allergen Reactions  . Penicillins Hives and Swelling    Has patient had a PCN reaction causing immediate rash, facial/tongue/throat swelling, SOB or lightheadedness with hypotension: Yes Has  patient had a PCN reaction causing severe rash involving mucus membranes or skin necrosis: No Has patient had a PCN reaction that required hospitalization No Has patient had a PCN reaction occurring within the last 10 years: No If all of the above answers are "NO", then may proceed with Cephalosporin use.   Eye swelling  . Metoprolol     "LOL" Drugs cause hair to fall out  . Other Nausea And Vomiting    General Anesthesia     Family History  Problem Relation Age of Onset  . Valvular heart disease Mother     Aortic valve replacement  . Diabetes Mother   . Heart attack Maternal Grandmother     50s  . Stroke Maternal Grandmother   . Diabetes Sister   . Diabetes Brother     BP 122/80   Pulse 74   Ht 5\' 2"  (1.575 m)   Wt 201 lb (91.2 kg)   SpO2 95%   BMI 36.76 kg/m   Review of Systems She has lost a few lbs.      Objective:   Physical Exam VITAL SIGNS:  See vs page GENERAL: no distress Pulses: dorsalis pedis intact bilat.   MSK: no deformity of the feet CV: trace bilat leg edema Skin:  no ulcer on the feet.  normal color and temp on the feet. Neuro: sensation is intact to touch on the feet.    Lab Results  Component Value Date   CREATININE 0.86 06/28/2016   BUN 12 06/28/2016   NA 137 06/28/2016   K 3.6 06/28/2016   CL 98 06/28/2016   CO2 34 (H) 06/28/2016   Lab Results  Component Value Date   HGBA1C 6.9 06/28/2016      Assessment & Plan:  HTN: well-controlled Edema: this limits oral DM rx options.  Vertigo: this precludes bromocriptine rx.  Dry mouth, new. Type 2 DM: she needs increased rx, if it can be done with a regimen that avoids or minimizes hypoglycemia.

## 2016-06-28 NOTE — Patient Instructions (Addendum)
check your blood sugar once a day.  vary the time of day when you check, between before the 3 meals, and at bedtime.  also check if you have symptoms of your blood sugar being too high or too low.  please keep a record of the readings and bring it to your next appointment here (or you can bring the meter itself).  You can write it on any piece of paper.  please call us sooner if your blood sugar goes below 70, or if you have a lot of readings over 200. Blood and urine tests are requested for you today.  We'll let you know about the results. Please try stopping the HCTZ pill.   I have sent a prescription to your pharmacy, to add "repaglinide."   Please come back for a follow-up appointment in 4 months.

## 2016-07-24 MED FILL — HYDROCHLOROTHIAZIDE 25 MG T: 25 | 60 days supply | Qty: 60 | Fill #0

## 2016-07-31 ENCOUNTER — Telehealth: Payer: Self-pay

## 2016-07-31 ENCOUNTER — Telehealth: Payer: Self-pay | Admitting: Internal Medicine

## 2016-07-31 MED ORDER — NEBIVOLOL HCL 5 MG PO TABS: 5.0000 mg | ORAL_TABLET | Freq: Every day | ORAL | 0 refills | Status: DC

## 2016-07-31 NOTE — Telephone Encounter (Signed)
New Message:    He has an allergy to Metoprolol,do you know what it is? Please call today.

## 2016-07-31 NOTE — Telephone Encounter (Signed)
Called, spoke with pt. Pt requested refill on Bystolic. Informed pt it has been over 1 year since appt with Dr. Ladona Ridgelaylor. Pt needs to schedule f/u with Dr. Ladona Ridgelaylor to get prescription refills. I will go ahead and send in 30 day supply of Bystolic today. Informed pt will be receiving a call from the scheduling department. Pt verbalized understanding and agreed with plan.

## 2016-07-31 NOTE — Telephone Encounter (Signed)
Spoke with [facility] Pharmacist and informed pt has taken this medication before and had stated at last office visit (06/29/15) allergy to Metoprolol (made her hair fall out).    Called pt, left detailed msg on mobile number (per DPR). Informed pt drug allergy list has allergy to Metoprolol. Nebivolol (Bystolic) is in the same classification of medication. Medication and allergy to Metoprolol was listed on AVS from last office visit 06/29/15. Informed pt, to make sure she was aware. Requested call to let our office know if she has had a different reaction other than hair falling out (which was listed on last office note 06/29/15).

## 2016-08-06 ENCOUNTER — Other Ambulatory Visit: Payer: Self-pay | Admitting: *Deleted

## 2016-08-06 MED ORDER — NEBIVOLOL HCL 5 MG PO TABS: 5.0000 mg | ORAL_TABLET | Freq: Every day | ORAL | 0 refills | Status: DC

## 2016-08-06 MED FILL — BYSTOLIC 5 MG TABLET: 5 | 90 days supply | Qty: 90 | Fill #0

## 2016-09-05 ENCOUNTER — Encounter (HOSPITAL_COMMUNITY): Payer: Self-pay | Admitting: Emergency Medicine

## 2016-09-05 ENCOUNTER — Telehealth: Payer: Self-pay | Admitting: Endocrinology

## 2016-09-05 ENCOUNTER — Ambulatory Visit (HOSPITAL_COMMUNITY)
Admission: EM | Admit: 2016-09-05 | Discharge: 2016-09-05 | Disposition: A | Discharge status: Home / Self Care | Payer: 59 | Attending: Emergency Medicine | Admitting: Emergency Medicine

## 2016-09-05 DIAGNOSIS — N3001 Acute cystitis with hematuria: Secondary | ICD-10-CM | POA: Diagnosis not present

## 2016-09-05 DIAGNOSIS — R350 Frequency of micturition: Secondary | ICD-10-CM | POA: Diagnosis present

## 2016-09-05 DIAGNOSIS — E118 Type 2 diabetes mellitus with unspecified complications: Secondary | ICD-10-CM

## 2016-09-05 LAB — POCT URINALYSIS DIP (DEVICE)
Bilirubin Urine: NEGATIVE
Glucose, UA: 500 mg/dL — AB
KETONES UR: NEGATIVE mg/dL
Leukocytes, UA: NEGATIVE
Nitrite: NEGATIVE
PH: 6 (ref 5.0–8.0)
PROTEIN: NEGATIVE mg/dL
Specific Gravity, Urine: 1.02 (ref 1.005–1.030)
Urobilinogen, UA: 0.2 mg/dL (ref 0.0–1.0)

## 2016-09-05 MED ORDER — CIPROFLOXACIN HCL 500 MG PO TABS: 500.0000 mg | ORAL_TABLET | Freq: Two times a day (BID) | ORAL | 0 refills | Status: DC

## 2016-09-05 NOTE — Telephone Encounter (Signed)
Pt called in and said that ever since the medication switch to the Repaglinide, she is having urine frequency.  She had the lab at her work test her urine last night and it came back with blood in her urine and also her glucose was at 500.  She tested her sugar with her meter and it only read 136, and this was after eating.  She would like to know what she needs to do. Please advise.

## 2016-09-05 NOTE — ED Triage Notes (Signed)
Here for UTI sx onset x5 associated w/urinary freq/uregency  Denies fevers, chills, n/v, abd pain  A&O x4... NAD

## 2016-09-05 NOTE — ED Provider Notes (Signed)
[facility]    CSN: [id number] Arrival date & time: 09/05/16  1949     History   Chief Complaint Chief Complaint  Patient presents with  . Urinary Tract Infection    HPI Carolyn Phillips is a 40 y.o. female.   HPI She is a 40 year old woman here for evaluation of urinary frequency and urgency. For the last 5 days she has had frequency and urgency. She also reports feeling like she has to push to get the last bit of urine out. No flank pain. No nausea, vomiting, or fevers. She is on Invokana for diabetes. She was also recently started on Repaglinide.   Past Medical History:  Diagnosis Date  . Beta thalassemia trait   . Complication of anesthesia   . Diabetes mellitus without complication (HCC)   . Dysrhythmia    WPW-ablation  . GERD (gastroesophageal reflux disease)   . Headache   . Hyperlipemia   . Hypertension   . Obesity   . PONV (postoperative nausea and vomiting)   . Wolff-Parkinson-White syndrome     Patient Active Problem List   Diagnosis Date Noted  . Paresthesias 06/02/2016  . Diabetes mellitus without complication (HCC)   . Hyperlipemia   . Chest pain 01/11/2016  . Diabetes mellitus (HCC) 01/11/2016  . Dyslipidemia 01/11/2016  . Hypokalemia 01/11/2016  . Wolff-Parkinson-White syndrome 07/04/2014  . Wolff-Parkinson-White (WPW) syndrome 07/04/2014  . Hypertension 05/25/2014  . WPW (Wolff-Parkinson-White syndrome) 05/25/2014  . Sleep apnea 05/25/2014    Past Surgical History:  Procedure Laterality Date  . ABDOMINAL HYSTERECTOMY    . BLADDER REPAIR    . BREAST REDUCTION SURGERY    . CHONDROPLASTY Left 09/13/2015   Procedure: CHONDROPLASTY;  Surgeon: Gean BirchwoodFrank Rowan, MD;  Location: [facility];  Service: Orthopedics;  Laterality: Left;  . KNEE ARTHROSCOPY WITH DRILLING/MICROFRACTURE Left 09/13/2015   Procedure: KNEE ARTHROSCOPY WITH DRILLING/MICROFRACTURE;  Surgeon: Gean BirchwoodFrank Rowan, MD;  Location:  SURGERY CENTER;   Service: Orthopedics;  Laterality: Left;  . SUPRAVENTRICULAR TACHYCARDIA ABLATION N/A 07/04/2014   Procedure: WPW ABLATION;  Surgeon: Marinus MawGregg W Taylor, MD;  [facility];  Service: Cardiovascular;  Laterality: N/A;  . TUBAL LIGATION      OB History    No data available       Home Medications    Prior to Admission medications   Medication Sig Start Date End Date Taking? Authorizing Provider  albuterol (PROVENTIL HFA;VENTOLIN HFA) 108 (90 Base) MCG/ACT inhaler Inhale 1-2 puffs into the lungs every 6 (six) hours as needed for wheezing or shortness of breath.    Historical Provider, MD  Biotin 2500 MCG CAPS Take 2 capsules by mouth daily.    Historical Provider, MD  canagliflozin (INVOKANA) 300 MG TABS tablet Take 1 tablet (300 mg total) by mouth daily. 02/16/16   Romero BellingSean Ellison, MD  cetirizine (ZYRTEC) 10 MG tablet Take 10 mg by mouth daily.    Historical Provider, MD  Cholecalciferol (VITAMIN D3) 3000 UNITS TABS Take 5,000 Units by mouth daily.     Historical Provider, MD  ciprofloxacin (CIPRO) 500 MG tablet Take 1 tablet (500 mg total) by mouth 2 (two) times daily. 09/05/16   Charm RingsErin J Honig, MD  Cyanocobalamin (VITAMIN B 12 PO) Take 1 tablet by mouth daily.    Historical Provider, MD  diazepam (VALIUM) 5 MG tablet Take 1 tablet (5 mg total) by mouth every 12 (twelve) hours as needed for anxiety (vertigo). 09/19/15   Eber HongBrian Miller, MD  meclizine (ANTIVERT) 25 MG tablet Take 1 tablet (25 mg total) by mouth 3 (three) times daily as needed for dizziness. 09/19/15   Eber HongBrian Miller, MD  methocarbamol (ROBAXIN) 500 MG tablet Take 500 mg by mouth as needed. 05/06/16   Historical Provider, MD  Multiple Vitamins-Minerals (ZINC PO) Take 1 tablet by mouth daily.    Historical Provider, MD  nebivolol (BYSTOLIC) 5 MG tablet Take 1 tablet (5 mg total) by mouth daily. 08/06/16   Marinus MawGregg W Taylor, MD  Probiotic Product (ALIGN) 4 MG CAPS Take 1 capsule by mouth daily.     Historical Provider, MD  repaglinide  (PRANDIN) 0.5 MG tablet Take 1 tablet (0.5 mg total) by mouth daily with supper. 06/28/16   Romero BellingSean Ellison, MD  rosuvastatin (CRESTOR) 5 MG tablet Take 5 mg by mouth daily.     Historical Provider, MD  SitaGLIPtin-MetFORMIN HCl (JANUMET XR) 50-1000 MG TB24 Take 2 tablets by mouth daily. 02/16/16   Romero BellingSean Ellison, MD  VITAMIN A PO Take 1 tablet by mouth daily.    Historical Provider, MD    Family History Family History  Problem Relation Age of Onset  . Valvular heart disease Mother     Aortic valve replacement  . Diabetes Mother   . Heart attack Maternal Grandmother     50s  . Stroke Maternal Grandmother   . Diabetes Sister   . Diabetes Brother     Social History Social History  Substance Use Topics  . Smoking status: Never Smoker  . Smokeless tobacco: Never Used     Comment: "only smoked for one year al ong time ago "  . Alcohol use 0.0 oz/week     Comment: occasional     Allergies   Penicillins; Metoprolol; and Other   Review of Systems Review of Systems As in history of present illness  Physical Exam Triage Vital Signs ED Triage Vitals [09/05/16 1957]  Enc Vitals Group     BP 122/77     Pulse Rate 68     Resp 14     Temp 98.9 F (37.2 C)     Temp Source Oral     SpO2 100 %     Weight      Height      Head Circumference      Peak Flow      Pain Score      Pain Loc      Pain Edu?      Excl. in GC?    No data found.   Updated Vital Signs BP 122/77 (BP Location: Left Arm)   Pulse 68   Temp 98.9 F (37.2 C) (Oral)   Resp 14   SpO2 100%   Visual Acuity Right Eye Distance:   Left Eye Distance:   Bilateral Distance:    Right Eye Near:   Left Eye Near:    Bilateral Near:     Physical Exam  Constitutional: She is oriented to person, place, and time. She appears well-developed and well-nourished. No distress.  Cardiovascular: Normal rate.   Pulmonary/Chest: Effort normal.  Abdominal:  No CVA tenderness  Neurological: She is alert and oriented to  person, place, and time.     UC Treatments / Results  Labs (all labs ordered are listed, but only abnormal results are displayed) Labs Reviewed  POCT URINALYSIS DIP (DEVICE) - Abnormal; Notable for the following:       Result Value   Glucose, UA 500 (*)    Hgb  urine dipstick TRACE (*)    All other components within normal limits  URINE CULTURE    EKG  EKG Interpretation None       Radiology No results found.  Procedures Procedures (including critical care time)  Medications Ordered in UC Medications - No data to display   Initial Impression / Assessment and Plan / UC Course  I have reviewed the triage vital signs and the nursing notes.  Pertinent labs & imaging results that were available during my care of the patient were reviewed by me and considered in my medical decision making (see chart for details).  Clinical Course     Urine sent for culture. We'll treat with Cipro while awaiting culture results. Folow up as needed.  Final Clinical Impressions(s) / UC Diagnoses   Final diagnoses:  Acute cystitis with hematuria    New Prescriptions New Prescriptions   CIPROFLOXACIN (CIPRO) 500 MG TABLET    Take 1 tablet (500 mg total) by mouth 2 (two) times daily.     Charm Rings, MD 09/05/16 2032

## 2016-09-05 NOTE — Discharge Instructions (Signed)
Take the Cipro twice a day for 1 week. The urine is sent for culture. Follow-up as needed.

## 2016-09-06 MED FILL — CIPROFLOXACIN HCL 500 MG TA: 500 | 7 days supply | Qty: 14 | Fill #0

## 2016-09-06 NOTE — Telephone Encounter (Signed)
I contacted the patient and left a voicemail. Advising of message. Requested a call back from the patient to schedule her blood tests.

## 2016-09-06 NOTE — Telephone Encounter (Signed)
The urine sugar of 500 is different from a blood sugar of 500.  However, please come by the lab to check a1c.  I have ordered.

## 2016-09-06 NOTE — Telephone Encounter (Signed)
See message and please advise, Thanks!  

## 2016-09-06 NOTE — Telephone Encounter (Signed)
Pt called in returning your phone call, she requests a call back at work. [phone]

## 2016-09-06 NOTE — Telephone Encounter (Signed)
Requested a call back from the patient to discuss.  

## 2016-09-08 ENCOUNTER — Telehealth (HOSPITAL_COMMUNITY): Payer: Self-pay | Admitting: Emergency Medicine

## 2016-09-08 LAB — URINE CULTURE: Culture: >=100000 — AB

## 2016-09-08 MED ORDER — SULFAMETHOXAZOLE-TRIMETHOPRIM 800-160 MG PO TABS: 1.0000 | ORAL_TABLET | Freq: Two times a day (BID) | ORAL | 0 refills | Status: AC

## 2016-09-08 NOTE — Telephone Encounter (Signed)
Urine culture resistant to cipro.  Pt reports worsening urinary symptoms and possible fevers since Friday night.  Will change to Bactrim BID x7 days.  Called in to [facility] at patient's request.

## 2016-09-10 ENCOUNTER — Other Ambulatory Visit (INDEPENDENT_AMBULATORY_CARE_PROVIDER_SITE_OTHER): Payer: 59

## 2016-09-10 DIAGNOSIS — E118 Type 2 diabetes mellitus with unspecified complications: Secondary | ICD-10-CM

## 2016-09-10 LAB — BASIC METABOLIC PANEL
BUN: 15 mg/dL (ref 6–23)
CALCIUM: 9.3 mg/dL (ref 8.4–10.5)
CHLORIDE: 103 meq/L (ref 96–112)
CO2: 27 mEq/L (ref 19–32)
CREATININE: 0.96 mg/dL (ref 0.40–1.20)
GFR: 81.91 mL/min (ref >60.00)
Glucose, Bld: 106 mg/dL — ABNORMAL HIGH (ref 70–99)
Potassium: 3.7 mEq/L (ref 3.5–5.1)
Sodium: 137 mEq/L (ref 135–145)

## 2016-09-10 LAB — HEMOGLOBIN A1C: HEMOGLOBIN A1C: 6.9 % — AB (ref 4.6–6.5)

## 2016-09-12 LAB — FRUCTOSAMINE: Fructosamine: 206 umol/L (ref 190–270)

## 2016-09-21 ENCOUNTER — Emergency Department (HOSPITAL_COMMUNITY)
Admission: EM | Admit: 2016-09-21 | Discharge: 2016-09-21 | Disposition: A | Discharge status: Home / Self Care | Payer: 59 | Attending: Emergency Medicine | Admitting: Emergency Medicine

## 2016-09-21 ENCOUNTER — Encounter (HOSPITAL_COMMUNITY): Payer: Self-pay | Admitting: Emergency Medicine

## 2016-09-21 ENCOUNTER — Emergency Department (HOSPITAL_COMMUNITY): Payer: 59

## 2016-09-21 DIAGNOSIS — E119 Type 2 diabetes mellitus without complications: Secondary | ICD-10-CM | POA: Diagnosis not present

## 2016-09-21 DIAGNOSIS — I10 Essential (primary) hypertension: Secondary | ICD-10-CM | POA: Insufficient documentation

## 2016-09-21 DIAGNOSIS — R42 Dizziness and giddiness: Secondary | ICD-10-CM | POA: Insufficient documentation

## 2016-09-21 DIAGNOSIS — Z79899 Other long term (current) drug therapy: Secondary | ICD-10-CM | POA: Insufficient documentation

## 2016-09-21 LAB — BASIC METABOLIC PANEL
ANION GAP: 7 (ref 5–15)
BUN: 11 mg/dL (ref 6–20)
CHLORIDE: 103 mmol/L (ref 101–111)
CO2: 26 mmol/L (ref 22–32)
CREATININE: 1.39 mg/dL — AB (ref 0.44–1.00)
Calcium: 9.4 mg/dL (ref 8.9–10.3)
GFR calc non Af Amer: 46 mL/min — ABNORMAL LOW (ref >60)
GFR, EST AFRICAN AMERICAN: 53 mL/min — AB (ref >60)
GLUCOSE: 187 mg/dL — AB (ref 65–99)
Potassium: 4.1 mmol/L (ref 3.5–5.1)
Sodium: 136 mmol/L (ref 135–145)

## 2016-09-21 LAB — CBC
HEMATOCRIT: 39.5 % (ref 36.0–46.0)
HEMOGLOBIN: 12.9 g/dL (ref 12.0–15.0)
MCH: 23.6 pg — ABNORMAL LOW (ref 26.0–34.0)
MCHC: 32.7 g/dL (ref 30.0–36.0)
MCV: 72.2 fL — AB (ref 78.0–100.0)
Platelets: 346 10*3/uL (ref 150–400)
RBC: 5.47 MIL/uL — ABNORMAL HIGH (ref 3.87–5.11)
RDW: 15.3 % (ref 11.5–15.5)
WBC: 7.3 10*3/uL (ref 4.0–10.5)

## 2016-09-21 MED ORDER — LORAZEPAM 2 MG/ML IJ SOLN
1.0000 mg | Freq: Once | INTRAMUSCULAR | Status: AC
  Administered 2016-09-21: 1 mg via INTRAVENOUS
  Filled 2016-09-21: qty 1

## 2016-09-21 MED ORDER — MECLIZINE HCL 25 MG PO TABS: 25.0000 mg | ORAL_TABLET | Freq: Three times a day (TID) | ORAL | 0 refills | Status: AC

## 2016-09-21 MED ORDER — PROCHLORPERAZINE EDISYLATE 5 MG/ML IJ SOLN
10.0000 mg | Freq: Once | INTRAMUSCULAR | Status: AC
  Administered 2016-09-21: 10 mg via INTRAVENOUS
  Filled 2016-09-21: qty 2

## 2016-09-21 MED ORDER — ONDANSETRON 4 MG PO TBDP
4.0000 mg | ORAL_TABLET | Freq: Once | ORAL | Status: AC
  Administered 2016-09-21: 4 mg via ORAL

## 2016-09-21 MED ORDER — LORAZEPAM 1 MG PO TABS: 1.0000 mg | ORAL_TABLET | Freq: Three times a day (TID) | ORAL | 0 refills | Status: DC | PRN

## 2016-09-21 MED ORDER — ONDANSETRON 4 MG PO TBDP
ORAL_TABLET | ORAL | Status: AC
  Filled 2016-09-21: qty 1

## 2016-09-21 MED ORDER — SODIUM CHLORIDE 0.9 % IV BOLUS (SEPSIS)
1000.0000 mL | Freq: Once | INTRAVENOUS | Status: AC
  Administered 2016-09-21: 1000 mL via INTRAVENOUS

## 2016-09-21 MED ORDER — MECLIZINE HCL 25 MG PO TABS
25.0000 mg | ORAL_TABLET | Freq: Once | ORAL | Status: AC
  Administered 2016-09-21: 25 mg via ORAL
  Filled 2016-09-21: qty 1

## 2016-09-21 NOTE — ED Provider Notes (Signed)
[facility] DEPT Provider Note   CSN: [id number] Arrival date & time: 09/21/16  0522     History   Chief Complaint Chief Complaint  Patient presents with  . Dizziness    hx vertigo    HPI Carolyn Phillips is a 40 y.o. female.  HPI  Patient presents with concerns over dizziness, lightheadedness or Patient has a difficult time distinguishing between lightheadedness and dizziness. Patient was well prior to going to sleep last night, awoke about 6 hours ago with dizziness Since onset symptoms of been persistent, with sensation of unsteadiness and near syncope. Patient has received Valium, Zofran, with some improvement in her ongoing nausea, but no substantial change in dizziness.  Patient also complains of ongoing distortion of vision in the right eye, with a heaviness, some changes in vision on the right lateral field Patient has 2 prior similar episodes, including one about one year ago.  She has a notable history of [employer] Parkinson White, status post ablation  Past Medical History:  Diagnosis Date  . Beta thalassemia trait   . Complication of anesthesia   . Diabetes mellitus without complication (HCC)   . Dysrhythmia    WPW-ablation  . GERD (gastroesophageal reflux disease)   . Headache   . Hyperlipemia   . Hypertension   . Obesity   . PONV (postoperative nausea and vomiting)   . Wolff-Parkinson-White syndrome     Patient Active Problem List   Diagnosis Date Noted  . Paresthesias 06/02/2016  . Diabetes mellitus without complication (HCC)   . Hyperlipemia   . Chest pain 01/11/2016  . Diabetes mellitus (HCC) 01/11/2016  . Dyslipidemia 01/11/2016  . Hypokalemia 01/11/2016  . Wolff-Parkinson-White syndrome 07/04/2014  . Wolff-Parkinson-White (WPW) syndrome 07/04/2014  . Hypertension 05/25/2014  . WPW (Wolff-Parkinson-White syndrome) 05/25/2014  . Sleep apnea 05/25/2014    Past Surgical History:  Procedure Laterality Date  . ABDOMINAL HYSTERECTOMY     . BLADDER REPAIR    . BREAST REDUCTION SURGERY    . CHONDROPLASTY Left 09/13/2015   Procedure: CHONDROPLASTY;  Surgeon: Gean BirchwoodFrank Rowan, MD;  Location: [facility];  Service: Orthopedics;  Laterality: Left;  . KNEE ARTHROSCOPY WITH DRILLING/MICROFRACTURE Left 09/13/2015   Procedure: KNEE ARTHROSCOPY WITH DRILLING/MICROFRACTURE;  Surgeon: Gean BirchwoodFrank Rowan, MD;  Location: [facility];  Service: Orthopedics;  Laterality: Left;  . SUPRAVENTRICULAR TACHYCARDIA ABLATION N/A 07/04/2014   Procedure: WPW ABLATION;  Surgeon: Marinus MawGregg W Taylor, MD;  [facility];  Service: Cardiovascular;  Laterality: N/A;  . TUBAL LIGATION      OB History    No data available       Home Medications    Prior to Admission medications   Medication Sig Start Date End Date Taking? Authorizing Provider  albuterol (PROVENTIL HFA;VENTOLIN HFA) 108 (90 Base) MCG/ACT inhaler Inhale 1-2 puffs into the lungs every 6 (six) hours as needed for wheezing or shortness of breath.    Historical Provider, MD  Biotin 2500 MCG CAPS Take 2 capsules by mouth daily.    Historical Provider, MD  canagliflozin (INVOKANA) 300 MG TABS tablet Take 1 tablet (300 mg total) by mouth daily. 02/16/16   Romero BellingSean Ellison, MD  cetirizine (ZYRTEC) 10 MG tablet Take 10 mg by mouth daily.    Historical Provider, MD  Cholecalciferol (VITAMIN D3) 3000 UNITS TABS Take 5,000 Units by mouth daily.     Historical Provider, MD  Cyanocobalamin (VITAMIN B 12 PO) Take 1 tablet by mouth daily.    Historical Provider,  MD  diazepam (VALIUM) 5 MG tablet Take 1 tablet (5 mg total) by mouth every 12 (twelve) hours as needed for anxiety (vertigo). 09/19/15   Eber HongBrian Miller, MD  meclizine (ANTIVERT) 25 MG tablet Take 1 tablet (25 mg total) by mouth 3 (three) times daily as needed for dizziness. 09/19/15   Eber HongBrian Miller, MD  methocarbamol (ROBAXIN) 500 MG tablet Take 500 mg by mouth as needed. 05/06/16   Historical Provider, MD  Multiple Vitamins-Minerals  (ZINC PO) Take 1 tablet by mouth daily.    Historical Provider, MD  nebivolol (BYSTOLIC) 5 MG tablet Take 1 tablet (5 mg total) by mouth daily. 08/06/16   Marinus MawGregg W Taylor, MD  Probiotic Product (ALIGN) 4 MG CAPS Take 1 capsule by mouth daily.     Historical Provider, MD  rosuvastatin (CRESTOR) 5 MG tablet Take 5 mg by mouth daily.     Historical Provider, MD  SitaGLIPtin-MetFORMIN HCl (JANUMET XR) 50-1000 MG TB24 Take 2 tablets by mouth daily. 02/16/16   Romero BellingSean Ellison, MD  VITAMIN A PO Take 1 tablet by mouth daily.    Historical Provider, MD    Family History Family History  Problem Relation Age of Onset  . Valvular heart disease Mother     Aortic valve replacement  . Diabetes Mother   . Heart attack Maternal Grandmother     50s  . Stroke Maternal Grandmother   . Diabetes Sister   . Diabetes Brother     Social History Social History  Substance Use Topics  . Smoking status: Never Smoker  . Smokeless tobacco: Never Used     Comment: "only smoked for one year al ong time ago "  . Alcohol use 0.0 oz/week     Comment: occasional     Allergies   Penicillins; Metoprolol; and Other   Review of Systems Review of Systems  Constitutional:       Per HPI, otherwise negative  HENT:       Per HPI, otherwise negative  Respiratory:       Per HPI, otherwise negative  Cardiovascular:       Per HPI, otherwise negative  Gastrointestinal: Positive for nausea. Negative for vomiting.  Endocrine:       Negative aside from HPI  Genitourinary:       Neg aside from HPI   Musculoskeletal:       Per HPI, otherwise negative  Skin: Negative.   Neurological: Positive for dizziness and light-headedness. Negative for syncope.     Physical Exam Updated Vital Signs BP 123/80   Pulse 76   Temp 98.6 F (37 C) (Oral)   Resp 17   Ht 5\' 3"  (1.6 m)   Wt 196 lb (88.9 kg)   SpO2 94%   BMI 34.72 kg/m   Physical Exam  Constitutional: She is oriented to person, place, and time. She appears  well-developed and well-nourished. No distress.  HENT:  Head: Normocephalic and atraumatic.  Eyes: Conjunctivae and EOM are normal.  Cardiovascular: Normal rate and regular rhythm.   Pulmonary/Chest: Effort normal and breath sounds normal. No stridor. No respiratory distress.  Abdominal: She exhibits no distension.  Musculoskeletal: She exhibits no edema.  Neurological: She is alert and oriented to person, place, and time. No cranial nerve deficit.  With head rotation to the right greater than left patient has some dizziness, strength is equal in all extremities, no discoordination.  Right eye peripheral vision impaired, with loss of ability to differentiate from the right lateral field,  left visual fields unremarkable  Skin: Skin is warm and dry.  Psychiatric: She has a normal mood and affect.  Nursing note and vitals reviewed.    ED Treatments / Results  Labs (all labs ordered are listed, but only abnormal results are displayed) Labs Reviewed  CBC - Abnormal; Notable for the following:       Result Value   RBC 5.47 (*)    MCV 72.2 (*)    MCH 23.6 (*)    All other components within normal limits  BASIC METABOLIC PANEL - Abnormal; Notable for the following:    Glucose, Bld 187 (*)    Creatinine, Ser 1.39 (*)    GFR calc non Af Amer 46 (*)    GFR calc Af Amer 53 (*)    All other components within normal limits    EKG  EKG Interpretation  Date/Time:  Saturday September 21 2016 07:34:02 EST Ventricular Rate:  70 PR Interval:    QRS Duration: 100 QT Interval:  393 QTC Calculation: 424 R Axis:   40 Text Interpretation:  Sinus rhythm Probable left atrial enlargement Baseline wander in lead(s) V6 Borderline ECG Confirmed by Gerhard Munch  MD [phone]) on 09/21/2016 8:03:18 AM Also confirmed by Gerhard Munch  MD [phone]), editor [place]  CCT, [place] ([id number])  on 09/21/2016 8:06:35 AM       Radiology Mr Brain Wo Contrast  Result Date: 09/21/2016 CLINICAL DATA:   Dizziness.  Peripheral right vision loss. EXAM: MRI HEAD WITHOUT CONTRAST TECHNIQUE: Multiplanar, multiecho pulse sequences of the brain and surrounding structures were obtained without intravenous contrast. COMPARISON:  None. FINDINGS: Brain: There is no evidence of acute infarct, intracranial hemorrhage, mass, midline shift, or extra-axial fluid collection. The ventricles and sulci are normal. The brain is normal in signal. Vascular: Major intracranial vascular flow voids are preserved. Skull and upper cervical spine: No focal marrow lesion. Sinuses/Orbits: Unremarkable orbits. Paranasal sinuses and mastoid air cells are clear. Other: None. IMPRESSION: Unremarkable brain MRI. Electronically Signed   By: Sebastian Ache M.D.   On: 09/21/2016 12:03    Procedures Procedures (including critical care time)  Medications Ordered in ED Medications  ondansetron (ZOFRAN-ODT) disintegrating tablet 4 mg (4 mg Oral Given 09/21/16 0606)  sodium chloride 0.9 % bolus 1,000 mL (0 mLs Intravenous Stopped 09/21/16 0915)  LORazepam (ATIVAN) injection 1 mg (1 mg Intravenous Given 09/21/16 0754)  meclizine (ANTIVERT) tablet 25 mg (25 mg Oral Given 09/21/16 0754)  prochlorperazine (COMPAZINE) injection 10 mg (10 mg Intravenous Given 09/21/16 0903)     Initial Impression / Assessment and Plan / ED Course  I have reviewed the triage vital signs and the nursing notes.  Pertinent labs & imaging results that were available during my care of the patient were reviewed by me and considered in my medical decision making (see chart for details).  Clinical Course     Update: Patient continues to complain of symptoms in spite of initial fluid resuscitation, multiple medication, and on repeat exam continues to have changes in the right lateral visual field. With concern for this, as well as persistent dizziness, MRI ordered.   1:05 PM Patient sleeping, when awakened she states that she feels better. I reviewed all findings  including reassuring MRI result with her. We discussed discharge with trial medication for dizziness, neurology follow-up. Absent focal neurologic deficits, with reassuring MRI, patient is appropriate for discharge with further outpatient evaluation. Final Clinical Impressions(s) / ED Diagnoses  Dizziness    Gerhard Munch, MD  09/21/16 1306  

## 2016-09-21 NOTE — Discharge Instructions (Signed)
As discussed, today's results have been largely reassuring, but with her continued episodes of dizziness it is important to follow-up with our neurologists in addition to your primary care physician.  Please take all medication as directed.

## 2016-09-21 NOTE — ED Notes (Signed)
Taken to MRI 

## 2016-09-21 NOTE — ED Notes (Signed)
papers reviewed with patient alomng with medications

## 2016-09-21 NOTE — ED Notes (Signed)
ED Provider at bedside. 

## 2016-09-21 NOTE — ED Notes (Signed)
Care [place], [place]

## 2016-09-21 NOTE — ED Triage Notes (Addendum)
Pt arrives with c/o dizziness similar to hx of vertigo starting this morning upon waking. States on the right side. No neuro deficits in triage. Meclizine and diazepam not effective.

## 2016-10-02 ENCOUNTER — Encounter: Payer: Self-pay | Admitting: Internal Medicine

## 2016-10-02 ENCOUNTER — Ambulatory Visit (INDEPENDENT_AMBULATORY_CARE_PROVIDER_SITE_OTHER): Payer: 59 | Admitting: Internal Medicine

## 2016-10-02 VITALS — BP 112/82 | HR 74 | Ht 62.0 in | Wt 206.8 lb

## 2016-10-02 DIAGNOSIS — I456 Pre-excitation syndrome: Secondary | ICD-10-CM | POA: Diagnosis not present

## 2016-10-02 MED ORDER — NEBIVOLOL HCL 5 MG PO TABS: ORAL_TABLET | ORAL | 11 refills | Status: DC

## 2016-10-02 NOTE — Progress Notes (Signed)
HPI Carolyn Phillips returns today for followup. She is a pleasant 41 yo woman with WPW syndrome who underwent cathter ablation of a manifest right sided AP over 2 years ago. She has had no sustained heart racing since then. She took HCTZ with improvement in her headache and blood pressure. She has been bothered by problems with vertigo which comes and goes and may last up to a week at a time. She has also had problems with insomnia and non-cardiac chest pain. Symptoms last a few seconds. No relation to activity. May be related to food intake.   Allergies  Allergen Reactions  . Penicillins Hives and Swelling    Has patient had a PCN reaction causing immediate rash, facial/tongue/throat swelling, SOB or lightheadedness with hypotension: Yes Has patient had a PCN reaction causing severe rash involving mucus membranes or skin necrosis: No Has patient had a PCN reaction that required hospitalization No Has patient had a PCN reaction occurring within the last 10 years: No If all of the above answers are "NO", then may proceed with Cephalosporin use.   Eye swelling  . Metoprolol     "LOL" Drugs cause hair to fall out  . Other Nausea And Vomiting    General Anesthesia      Current Outpatient Prescriptions  Medication Sig Dispense Refill  . Biotin 2500 MCG CAPS Take 2 capsules by mouth daily.    . canagliflozin (INVOKANA) 300 MG TABS tablet Take 1 tablet (300 mg total) by mouth daily. 30 tablet 11  . cetirizine (ZYRTEC) 10 MG tablet Take 10 mg by mouth daily.    . Cholecalciferol (VITAMIN D3) 3000 UNITS TABS Take 5,000 Units by mouth daily.     . Cyanocobalamin (VITAMIN B 12 PO) Take 1 tablet by mouth daily.    . diazepam (VALIUM) 5 MG tablet Take 1 tablet (5 mg total) by mouth every 12 (twelve) hours as needed for anxiety (vertigo). 10 tablet 0  . LORazepam (ATIVAN) 1 MG tablet Take 1 tablet (1 mg total) by mouth 3 (three) times daily as needed (dizziness / nausea). 15 tablet 0  .  Multiple Vitamins-Minerals (ZINC PO) Take 1 tablet by mouth daily.    . nebivolol (BYSTOLIC) 5 MG tablet Take 1 tablet (5 mg total) by mouth daily. 90 tablet 0  . repaglinide (PRANDIN) 0.5 MG tablet Take 0.5 mg by mouth as needed. Elevated A1c    . rosuvastatin (CRESTOR) 5 MG tablet Take 5 mg by mouth daily.     Marland Kitchen VITAMIN A PO Take 1 tablet by mouth daily.     No current facility-administered medications for this visit.      Past Medical History:  Diagnosis Date  . Beta thalassemia trait   . Complication of anesthesia   . Diabetes mellitus without complication (HCC)   . Dysrhythmia    WPW-ablation  . GERD (gastroesophageal reflux disease)   . Headache   . Hyperlipemia   . Hypertension   . Obesity   . PONV (postoperative nausea and vomiting)   . Wolff-Parkinson-White syndrome     ROS:   All systems reviewed and negative except as noted in the HPI.   Past Surgical History:  Procedure Laterality Date  . ABDOMINAL HYSTERECTOMY    . BLADDER REPAIR    . BREAST REDUCTION SURGERY    . CHONDROPLASTY Left 09/13/2015   Procedure: CHONDROPLASTY;  Surgeon: Gean Birchwood, MD;  Location: [facility];  Service: Orthopedics;  Laterality: Left;  .  KNEE ARTHROSCOPY WITH DRILLING/MICROFRACTURE Left 09/13/2015   Procedure: KNEE ARTHROSCOPY WITH DRILLING/MICROFRACTURE;  Surgeon: Gean BirchwoodFrank Rowan, MD;  Location: [facility];  Service: Orthopedics;  Laterality: Left;  . SUPRAVENTRICULAR TACHYCARDIA ABLATION N/A 07/04/2014   Procedure: WPW ABLATION;  Surgeon: Marinus MawGregg W Lachandra Dettmann, MD;  [facility];  Service: Cardiovascular;  Laterality: N/A;  . TUBAL LIGATION       Family History  Problem Relation Age of Onset  . Valvular heart disease Mother     Aortic valve replacement  . Diabetes Mother   . Heart attack Maternal Grandmother     50s  . Stroke Maternal Grandmother   . Diabetes Sister   . Diabetes Brother      Social History   Social History  . Marital  status: Legally Separated    Spouse name: N/A  . Number of children: 2  . Years of education: College   Occupational History  . 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 Smoking status: Never Smoker  . Smokeless tobacco: Never Used     Comment: "only smoked for one year al ong time ago "  . Alcohol use 0.0 oz/week     Comment: occasional  . Drug use: No  . Sexual activity: Not on file   Other Topics Concern  . Not on file   Social History Narrative   Pt lives with mother, children and husband.   Caffeine use: small coffee per day   1 mountain dew per day     BP 112/82   Pulse 74   Ht 5\' 2"  (1.575 m)   Wt 206 lb 12.8 oz (93.8 kg)   BMI 37.82 kg/m   Physical Exam:  obese appearing 41 yo woman, NAD HEENT: Unremarkable Neck:  6 cm JVD, no thyromegally Back:  No CVA tenderness Lungs:  Clear with no wheezes HEART:  Regular rate rhythm, no murmurs, no rubs, no clicks Abd:  soft, positive bowel sounds, no organomegally, no rebound, no guarding Ext:  2 plus pulses, no edema, no cyanosis, no clubbing Skin:  No rashes no nodules Neuro:  CN II through XII intact, motor grossly intact   Assess/Plan: 1. WPW - she is status post ablation and has no evidence of recurrence 2. Chest pain - her symptoms are non-cardiac. I have asked her to try some acid suppression with maalox or H+ blockers 3. Vertigo - her symptoms are not related to cardiac arrhythmias. She is better.  Leonia ReevesGregg Adreona Brand,M.D.

## 2016-10-02 NOTE — Patient Instructions (Signed)

## 2016-10-10 MED FILL — INVOKANA 300 MG TABLET: 300 | 90 days supply | Qty: 90 | Fill #1

## 2016-10-12 DIAGNOSIS — Z0100 Encounter for examination of eyes and vision without abnormal findings: Secondary | ICD-10-CM | POA: Diagnosis not present

## 2016-10-18 ENCOUNTER — Ambulatory Visit (HOSPITAL_COMMUNITY)
Admission: EM | Admit: 2016-10-18 | Discharge: 2016-10-18 | Disposition: A | Discharge status: Home / Self Care | Payer: 59 | Attending: Family Medicine | Admitting: Family Medicine

## 2016-10-18 ENCOUNTER — Encounter (HOSPITAL_COMMUNITY): Payer: Self-pay | Admitting: *Deleted

## 2016-10-18 DIAGNOSIS — H9202 Otalgia, left ear: Secondary | ICD-10-CM | POA: Diagnosis not present

## 2016-10-18 DIAGNOSIS — H6123 Impacted cerumen, bilateral: Secondary | ICD-10-CM | POA: Diagnosis not present

## 2016-10-18 DIAGNOSIS — H6122 Impacted cerumen, left ear: Secondary | ICD-10-CM

## 2016-10-18 NOTE — ED Notes (Signed)
Ear  Gently  Irrigated by  Myself  And  [employer]Bill  [place]    Mod  Amt        Wax   Returned      Pt tolerated procedure  Well

## 2016-10-18 NOTE — ED Provider Notes (Signed)
CSN: [id number]     Arrival date & time 10/18/16  1736 History   None    Chief Complaint  Patient presents with  . Otalgia   (Consider location/radiation/quality/duration/timing/severity/associated sxs/prior Treatment) Patient c/o decreased hearing acuity and discomfort in her left ear.   The history is provided by the patient.  Otalgia  Location:  Left Behind ear:  No abnormality Quality:  Dull Severity:  Mild Onset quality:  Sudden Duration:  4 days Timing:  Constant Chronicity:  New Relieved by:  Nothing Worsened by:  Nothing Ineffective treatments:  None tried   Past Medical History:  Diagnosis Date  . Beta thalassemia trait   . Complication of anesthesia   . Diabetes mellitus without complication (HCC)   . Dysrhythmia    WPW-ablation  . GERD (gastroesophageal reflux disease)   . Headache   . Hyperlipemia   . Hypertension   . Obesity   . PONV (postoperative nausea and vomiting)   . Wolff-Parkinson-White syndrome    Past Surgical History:  Procedure Laterality Date  . ABDOMINAL HYSTERECTOMY    . BLADDER REPAIR    . BREAST REDUCTION SURGERY    . CHONDROPLASTY Left 09/13/2015   Procedure: CHONDROPLASTY;  Surgeon: Gean Birchwood, MD;  Location: [facility];  Service: Orthopedics;  Laterality: Left;  . KNEE ARTHROSCOPY WITH DRILLING/MICROFRACTURE Left 09/13/2015   Procedure: KNEE ARTHROSCOPY WITH DRILLING/MICROFRACTURE;  Surgeon: Gean Birchwood, MD;  Location: [facility];  Service: Orthopedics;  Laterality: Left;  . SUPRAVENTRICULAR TACHYCARDIA ABLATION N/A 07/04/2014   Procedure: WPW ABLATION;  Surgeon: Marinus Maw, MD;  [facility];  Service: Cardiovascular;  Laterality: N/A;  . TUBAL LIGATION     Family History  Problem Relation Age of Onset  . Valvular heart disease Mother     Aortic valve replacement  . Diabetes Mother   . Heart attack Maternal Grandmother     50s  . Stroke Maternal Grandmother   . Diabetes Sister    . Diabetes Brother    Social History  Substance Use Topics  . Smoking status: Never Smoker  . Smokeless tobacco: Never Used     Comment: "only smoked for one year al ong time ago "  . Alcohol use 0.0 oz/week     Comment: occasional   OB History    No data available     Review of Systems  Constitutional: Negative.   HENT: Positive for ear pain.   Eyes: Negative.   Respiratory: Negative.   Cardiovascular: Negative.   Gastrointestinal: Negative.   Endocrine: Negative.   Genitourinary: Negative.   Musculoskeletal: Negative.   Allergic/Immunologic: Negative.   Neurological: Negative.   Hematological: Negative.   Psychiatric/Behavioral: Negative.     Allergies  Penicillins; Metoprolol; and Other  Home Medications   Prior to Admission medications   Medication Sig Start Date End Date Taking? Authorizing Provider  Biotin 2500 MCG CAPS Take 2 capsules by mouth daily.    Historical Provider, MD  canagliflozin (INVOKANA) 300 MG TABS tablet Take 1 tablet (300 mg total) by mouth daily. 02/16/16   Romero Belling, MD  cetirizine (ZYRTEC) 10 MG tablet Take 10 mg by mouth daily.    Historical Provider, MD  Cholecalciferol (VITAMIN D3) 3000 UNITS TABS Take 5,000 Units by mouth daily.     Historical Provider, MD  Cyanocobalamin (VITAMIN B 12 PO) Take 1 tablet by mouth daily.    Historical Provider, MD  diazepam (VALIUM) 5 MG tablet Take 1 tablet (5  mg total) by mouth every 12 (twelve) hours as needed for anxiety (vertigo). 09/19/15   Eber HongBrian Miller, MD  LORazepam (ATIVAN) 1 MG tablet Take 1 tablet (1 mg total) by mouth 3 (three) times daily as needed (dizziness / nausea). 09/21/16   Gerhard Munchobert Lockwood, MD  Multiple Vitamins-Minerals (ZINC PO) Take 1 tablet by mouth daily.    Historical Provider, MD  nebivolol (BYSTOLIC) 5 MG tablet Take 1 tablet (5 mg) daily as needed 10/02/16   Marinus MawGregg W Taylor, MD  repaglinide (PRANDIN) 0.5 MG tablet Take 0.5 mg by mouth as needed. Elevated A1c    Historical  Provider, MD  rosuvastatin (CRESTOR) 5 MG tablet Take 5 mg by mouth daily.     Historical Provider, MD  VITAMIN A PO Take 1 tablet by mouth daily.    Historical Provider, MD   Meds Ordered and Administered this Visit  Medications - No data to display  BP 125/84 (BP Location: Left Arm)   Pulse 70   Temp 98 F (36.7 C) (Oral)   Resp 15   SpO2 100%  No data found.   Physical Exam  Constitutional: She appears well-developed and well-nourished.  HENT:  Head: Normocephalic.  Mouth/Throat: Oropharynx is clear and moist.  Bilateral cerumen impactions  Eyes: Conjunctivae and EOM are normal. Pupils are equal, round, and reactive to light.  Neck: Normal range of motion. Neck supple.  Cardiovascular: Normal rate, regular rhythm and normal heart sounds.   Pulmonary/Chest: Effort normal and breath sounds normal.  Nursing note and vitals reviewed.   Urgent Care Course     Procedures (including critical care time)  Labs Review Labs Reviewed - No data to display  Imaging Review No results found.   Visual Acuity Review  Right Eye Distance:   Left Eye Distance:   Bilateral Distance:    Right Eye Near:   Left Eye Near:    Bilateral Near:         MDM   1. Impacted cerumen of left ear   2. Otalgia of left ear    Left ear irrigated with success and right attempted w/o success.  Patient advise to soak right ear with debrox at home and follow up in 2 days.      Deatra CanterWilliam J Oxford, FNP 10/18/16 [phone]

## 2016-10-18 NOTE — ED Triage Notes (Signed)
Patient reports decreased hearing since Tuesday, left more then right. No pain or fevers. No nasal drainage or sore throat.

## 2016-10-20 NOTE — Progress Notes (Signed)
Subjective:    Patient ID: Carolyn Phillips, female    DOB: [birth year], 41 y.o.   MRN: [id number]  HPI Pt returns for f/u of diabetes mellitus: DM type: 2 Dx'ed: 2013 Complications: none Therapy: invokana GDM: never DKA: never Severe hypoglycemia: never Pancreatitis: never Other: she has never been on insulin; diarrhea, edema, and vertigo limit oral rx options.   Interval history: pt states she feels well in general.  She does not check cbg's. She does not take repaglinide.   Past Medical History:  Diagnosis Date  . Beta thalassemia trait   . Complication of anesthesia   . Diabetes mellitus without complication (HCC)   . Dysrhythmia    WPW-ablation  . GERD (gastroesophageal reflux disease)   . Headache   . Hyperlipemia   . Hypertension   . Obesity   . PONV (postoperative nausea and vomiting)   . Wolff-Parkinson-White syndrome     Past Surgical History:  Procedure Laterality Date  . ABDOMINAL HYSTERECTOMY    . BLADDER REPAIR    . BREAST REDUCTION SURGERY    . CHONDROPLASTY Left 09/13/2015   Procedure: CHONDROPLASTY;  Surgeon: Gean Birchwood, MD;  Location: [facility];  Service: Orthopedics;  Laterality: Left;  . KNEE ARTHROSCOPY WITH DRILLING/MICROFRACTURE Left 09/13/2015   Procedure: KNEE ARTHROSCOPY WITH DRILLING/MICROFRACTURE;  Surgeon: Gean Birchwood, MD;  Location: [facility];  Service: Orthopedics;  Laterality: Left;  . SUPRAVENTRICULAR TACHYCARDIA ABLATION N/A 07/04/2014   Procedure: WPW ABLATION;  Surgeon: Marinus Maw, MD;  Location: [facility];  Service: Cardiovascular;  Laterality: N/A;  . TUBAL LIGATION      Social History   Social History  . Marital status: Legally Separated    Spouse name: N/A  . Number of children: 2  . Years of education: College   Occupational History  . 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 Smoking status: Never Smoker  . Smokeless tobacco: Never Used     Comment:  "only smoked for one year al ong time ago "  . Alcohol use 0.0 oz/week     Comment: occasional  . Drug use: No  . Sexual activity: Not on file   Other Topics Concern  . Not on file   Social History Narrative   Pt lives with mother, children and husband.   Caffeine use: small coffee per day   1 mountain dew per day    Current Outpatient Prescriptions on File Prior to Visit  Medication Sig Dispense Refill  . Biotin 2500 MCG CAPS Take 2 capsules by mouth daily.    . canagliflozin (INVOKANA) 300 MG TABS tablet Take 1 tablet (300 mg total) by mouth daily. 30 tablet 11  . cetirizine (ZYRTEC) 10 MG tablet Take 10 mg by mouth daily.    . Cholecalciferol (VITAMIN D3) 3000 UNITS TABS Take 5,000 Units by mouth daily.     . Cyanocobalamin (VITAMIN B 12 PO) Take 1 tablet by mouth daily.    . diazepam (VALIUM) 5 MG tablet Take 1 tablet (5 mg total) by mouth every 12 (twelve) hours as needed for anxiety (vertigo). 10 tablet 0  . LORazepam (ATIVAN) 1 MG tablet Take 1 tablet (1 mg total) by mouth 3 (three) times daily as needed (dizziness / nausea). 15 tablet 0  . Multiple Vitamins-Minerals (ZINC PO) Take 1 tablet by mouth daily.    . nebivolol (BYSTOLIC) 5 MG tablet Take 1 tablet (5 mg) daily as  needed 30 tablet 11  . rosuvastatin (CRESTOR) 5 MG tablet Take 5 mg by mouth daily.     Marland Kitchen. VITAMIN A PO Take 1 tablet by mouth daily.     No current facility-administered medications on file prior to visit.     Allergies  Allergen Reactions  . Penicillins Hives and Swelling    Has patient had a PCN reaction causing immediate rash, facial/tongue/throat swelling, SOB or lightheadedness with hypotension: Yes Has patient had a PCN reaction causing severe rash involving mucus membranes or skin necrosis: No Has patient had a PCN reaction that required hospitalization No Has patient had a PCN reaction occurring within the last 10 years: No If all of the above answers are "NO", then may proceed with  Cephalosporin use.   Eye swelling  . Metoprolol     "LOL" Drugs cause hair to fall out  . Other Nausea And Vomiting    General Anesthesia     Family History  Problem Relation Age of Onset  . Valvular heart disease Mother     Aortic valve replacement  . Diabetes Mother   . Heart attack Maternal Grandmother     50s  . Stroke Maternal Grandmother   . Diabetes Sister   . Diabetes Brother     BP 122/82   Pulse 78   Ht 5\' 2"  (1.575 m)   Wt 208 lb (94.3 kg)   SpO2 98%   BMI 38.04 kg/m    Review of Systems She has dysuria.     Objective:   Physical Exam VITAL SIGNS:  See vs page GENERAL: no distress Pulses: dorsalis pedis intact bilat.   MSK: no deformity of the feet CV: trace bilat leg edema Skin:  no ulcer on the feet.  normal color and temp on the feet. Neuro: sensation is intact to touch on the feet.   Lab Results  Component Value Date   HGBA1C 6.9 (H) 09/10/2016       Assessment & Plan:  HTN: well-controlled Edema: this limits oral DM rx options.  Vertigo: this precludes bromocriptine rx.  Type 2 DM: well-controlled. Dysuria, new, SKIN:  Insulin injection sites at the anterior abdomen are normal  Patient is advised the following: Patient Instructions  check your blood sugar once a day.  vary the time of day when you check, between before the 3 meals, and at bedtime.  also check if you have symptoms of your blood sugar being too high or too low.  please keep a record of the readings and bring it to your next appointment here (or you can bring the meter itself).  You can write it on any piece of paper.  please call us sooner if your blood sugar goes below 70, or if you have a lot of readings over 200. A urine test is requested for you today.  We'll let you know about the results.  Please continue the same invokana.  Please come back for a follow-up appointment in 4 months.

## 2016-10-24 ENCOUNTER — Encounter: Payer: Self-pay | Admitting: Endocrinology

## 2016-10-24 ENCOUNTER — Ambulatory Visit (INDEPENDENT_AMBULATORY_CARE_PROVIDER_SITE_OTHER): Payer: 59 | Admitting: Endocrinology

## 2016-10-24 VITALS — BP 122/82 | HR 78 | Ht 62.0 in | Wt 208.0 lb

## 2016-10-24 DIAGNOSIS — R300 Dysuria: Secondary | ICD-10-CM

## 2016-10-24 MED ORDER — GLUCOSE BLOOD VI STRP: 1.0000 | ORAL_STRIP | Freq: Every day | 3 refills | Status: DC

## 2016-10-24 NOTE — Patient Instructions (Addendum)
check your blood sugar once a day.  vary the time of day when you check, between before the 3 meals, and at bedtime.  also check if you have symptoms of your blood sugar being too high or too low.  please keep a record of the readings and bring it to your next appointment here (or you can bring the meter itself).  You can write it on any piece of paper.  please call us sooner if your blood sugar goes below 70, or if you have a lot of readings over 200. A urine test is requested for you today.  We'll let you know about the results.  Please continue the same invokana.  Please come back for a follow-up appointment in 4 months.

## 2016-10-25 LAB — URINALYSIS, ROUTINE W REFLEX MICROSCOPIC
Bilirubin Urine: NEGATIVE
HGB URINE DIPSTICK: NEGATIVE
Ketones, ur: NEGATIVE
Leukocytes, UA: NEGATIVE
NITRITE: NEGATIVE
SPECIFIC GRAVITY, URINE: 1.025 (ref 1.000–1.030)
Total Protein, Urine: NEGATIVE
Urine Glucose: >=1000 — AB
Urobilinogen, UA: 0.2 (ref 0.0–1.0)
pH: 5.5 (ref 5.0–8.0)

## 2016-11-01 ENCOUNTER — Other Ambulatory Visit: Payer: Self-pay

## 2016-11-01 MED ORDER — FREESTYLE LANCETS MISC: 2 refills | Status: DC

## 2016-11-01 MED ORDER — FREESTYLE LITE DEVI: 2 refills | Status: DC

## 2016-11-01 MED ORDER — GLUCOSE BLOOD VI STRP: ORAL_STRIP | 2 refills | Status: DC

## 2016-11-01 MED FILL — FREESTYLE LANCETS: 90 days supply | Qty: 100 | Fill #0

## 2016-11-01 MED FILL — FREESTYLE LITE METER: 30 days supply | Qty: 1 | Fill #0

## 2016-11-01 MED FILL — FREESTYLE LITE TEST STRIP: 90 days supply | Qty: 100 | Fill #0

## 2016-12-02 MED FILL — REPAGLINIDE 0.5 MG TABLET: 0.5 | 90 days supply | Qty: 90 | Fill #1

## 2017-01-23 MED FILL — predniSONE 20 MG TABS: 20 | 3 days supply | Qty: 6 | Fill #0

## 2017-02-13 MED FILL — BYSTOLIC 5 MG TABLET: 5 | 30 days supply | Qty: 30 | Fill #0

## 2017-02-17 ENCOUNTER — Encounter: Payer: Self-pay | Admitting: Endocrinology

## 2017-02-17 ENCOUNTER — Other Ambulatory Visit: Payer: Self-pay

## 2017-02-17 MED ORDER — CANAGLIFLOZIN 300 MG PO TABS: 300.0000 mg | ORAL_TABLET | Freq: Every day | ORAL | 11 refills | Status: DC

## 2017-02-20 MED FILL — INVOKANA 300 MG TABLET: 300 | 30 days supply | Qty: 30 | Fill #0

## 2017-02-21 ENCOUNTER — Encounter: Payer: Self-pay | Admitting: Endocrinology

## 2017-02-21 ENCOUNTER — Ambulatory Visit (INDEPENDENT_AMBULATORY_CARE_PROVIDER_SITE_OTHER): Payer: 59 | Admitting: Endocrinology

## 2017-02-21 VITALS — BP 114/74 | HR 68 | Ht 62.0 in | Wt 211.0 lb

## 2017-02-21 DIAGNOSIS — E118 Type 2 diabetes mellitus with unspecified complications: Secondary | ICD-10-CM | POA: Diagnosis not present

## 2017-02-21 LAB — POCT GLYCOSYLATED HEMOGLOBIN (HGB A1C): HEMOGLOBIN A1C: 6.7

## 2017-02-21 MED ORDER — REPAGLINIDE 0.5 MG PO TABS: 0.5000 mg | ORAL_TABLET | Freq: Every day | ORAL | 3 refills | Status: DC

## 2017-02-21 MED FILL — REPAGLINIDE 0.5 MG TABLET: 0.5 | 90 days supply | Qty: 90 | Fill #0

## 2017-02-21 NOTE — Patient Instructions (Signed)
Please continue the same medications.   We have sent the prior authorization.  You will hear back.  If it is declined, it will be because you have not been on janumet within the past 120 days.   check your blood sugar once a day.  vary the time of day when you check, between before the 3 meals, and at bedtime.  also check if you have symptoms of your blood sugar being too high or too low.  please keep a record of the readings and bring it to your next appointment here (or you can bring the meter itself).  You can write it on any piece of paper.  please call us sooner if your blood sugar goes below 70, or if you have a lot of readings over 200.

## 2017-02-21 NOTE — Progress Notes (Signed)
Subjective:    Patient ID: Carolyn Phillips, female    DOB: May 14, 1976, 41 y.o.   MRN: [id number]  HPI Pt returns for f/u of diabetes mellitus:  DM type: 2 Dx'ed: 2013 Complications: none.  Therapy: invokana and repaglinide.  GDM: never DKA: never Severe hypoglycemia: never.  Pancreatitis: never.  Other: she has never been on insulin; diarrhea, edema, and vertigo limit oral rx options.   Interval history: pt states she feels well in general.  cbg's vary from 90-171.  There is no trend throughout the day.  Past Medical History:  Diagnosis Date  . Beta thalassemia trait   . Complication of anesthesia   . Diabetes mellitus without complication (HCC)   . Dysrhythmia    WPW-ablation  . GERD (gastroesophageal reflux disease)   . Headache   . Hyperlipemia   . Hypertension   . Obesity   . PONV (postoperative nausea and vomiting)   . Wolff-Parkinson-White syndrome     Past Surgical History:  Procedure Laterality Date  . ABDOMINAL HYSTERECTOMY    . BLADDER REPAIR    . BREAST REDUCTION SURGERY    . CHONDROPLASTY Left 09/13/2015   Procedure: CHONDROPLASTY;  Surgeon: Gean Birchwood, MD;  Location: [facility];  Service: Orthopedics;  Laterality: Left;  . KNEE ARTHROSCOPY WITH DRILLING/MICROFRACTURE Left 09/13/2015   Procedure: KNEE ARTHROSCOPY WITH DRILLING/MICROFRACTURE;  Surgeon: Gean Birchwood, MD;  Location:  [facility];  Service: Orthopedics;  Laterality: Left;  . SUPRAVENTRICULAR TACHYCARDIA ABLATION N/A 07/04/2014   Procedure: WPW ABLATION;  Surgeon: Marinus Maw, MD;  Location: [facility];  Service: Cardiovascular;  Laterality: N/A;  . TUBAL LIGATION      Social History   Social History  . Marital status: Legally Separated    Spouse name: N/A  . Number of children: 2  . Years of education: College   Occupational History  . 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 Smoking status: Never Smoker  . Smokeless  tobacco: Never Used     Comment: "only smoked for one year al ong time ago "  . Alcohol use 0.0 oz/week     Comment: occasional  . Drug use: No  . Sexual activity: Not on file   Other Topics Concern  . Not on file   Social History Narrative   Pt lives with mother, children and husband.   Caffeine use: small coffee per day   1 mountain dew per day    Current Outpatient Prescriptions on File Prior to Visit  Medication Sig Dispense Refill  . Biotin 2500 MCG CAPS Take 2 capsules by mouth daily.    . canagliflozin (INVOKANA) 300 MG TABS tablet Take 1 tablet (300 mg total) by mouth daily. 30 tablet 11  . cetirizine (ZYRTEC) 10 MG tablet Take 10 mg by mouth daily.    . Cholecalciferol (VITAMIN D3) 3000 UNITS TABS Take 5,000 Units by mouth daily.     . Cyanocobalamin (VITAMIN B 12 PO) Take 1 tablet by mouth daily.    . diazepam (VALIUM) 5 MG tablet Take 1 tablet (5 mg total) by mouth every 12 (twelve) hours as needed for anxiety (vertigo). 10 tablet 0  . LORazepam (ATIVAN) 1 MG tablet Take 1 tablet (1 mg total) by mouth 3 (three) times daily as needed (dizziness / nausea). 15 tablet 0  . Multiple Vitamins-Minerals (ZINC PO) Take 1 tablet by mouth daily.    . nebivolol (BYSTOLIC) 5 MG  tablet Take 1 tablet (5 mg) daily as needed 30 tablet 11  . rosuvastatin (CRESTOR) 5 MG tablet Take 5 mg by mouth daily.     Marland Kitchen. VITAMIN A PO Take 1 tablet by mouth daily.    . Blood Glucose Monitoring Suppl (FREESTYLE LITE) DEVI Use to check blood sugar 1 time per day. Dx Code: E11.9 (Patient not taking: Reported on 02/21/2017) 1 each 2  . glucose blood (FREESTYLE LITE) test strip Use to check blood sugar 1 time per day. Dx Code: E11.9 (Patient not taking: Reported on 02/21/2017) 100 each 2  . Lancets (FREESTYLE) lancets Use to check blood sugar 1 time per day. Dx Code: E11.9 (Patient not taking: Reported on 02/21/2017) 100 each 2   No current facility-administered medications on file prior to visit.      Allergies  Allergen Reactions  . Penicillins Hives and Swelling    Has patient had a PCN reaction causing immediate rash, facial/tongue/throat swelling, SOB or lightheadedness with hypotension: Yes Has patient had a PCN reaction causing severe rash involving mucus membranes or skin necrosis: No Has patient had a PCN reaction that required hospitalization No Has patient had a PCN reaction occurring within the last 10 years: No If all of the above answers are "NO", then may proceed with Cephalosporin use.   Eye swelling  . Metoprolol     "LOL" Drugs cause hair to fall out  . Other Nausea And Vomiting    General Anesthesia     Family History  Problem Relation Age of Onset  . Valvular heart disease Mother        Aortic valve replacement  . Diabetes Mother   . Heart attack Maternal Grandmother        50s  . Stroke Maternal Grandmother   . Diabetes Sister   . Diabetes Brother     BP 114/74   Pulse 68   Ht 5\' 2"  (1.575 m)   Wt 211 lb (95.7 kg)   SpO2 90%   BMI 38.59 kg/m    Review of Systems She denies hypoglycemia.      Objective:   Physical Exam VITAL SIGNS:  See vs page GENERAL: no distress Pulses: dorsalis pedis intact bilat.   MSK: no deformity of the feet CV: trace bilat leg edema Skin:  no ulcer on the feet.  normal color and temp on the feet. Neuro: sensation is intact to touch on the feet.   A1c=6.7%    Assessment & Plan:  Type 2 DM: well-controlled.  However, her ins may not approve invokana. Edema: this limits rx options  Patient Instructions  Please continue the same medications.   We have sent the prior authorization.  You will hear back.  If it is declined, it will be because you have not been on janumet within the past 120 days.   check your blood sugar once a day.  vary the time of day when you check, between before the 3 meals, and at bedtime.  also check if you have symptoms of your blood sugar being too high or too low.  please keep a  record of the readings and bring it to your next appointment here (or you can bring the meter itself).  You can write it on any piece of paper.  please call us sooner if your blood sugar goes below 70, or if you have a lot of readings over 200.

## 2017-03-11 ENCOUNTER — Encounter: Payer: Self-pay | Admitting: Internal Medicine

## 2017-03-11 ENCOUNTER — Other Ambulatory Visit: Payer: Self-pay | Admitting: Internal Medicine

## 2017-03-11 DIAGNOSIS — Z1231 Encounter for screening mammogram for malignant neoplasm of breast: Secondary | ICD-10-CM

## 2017-03-12 ENCOUNTER — Telehealth: Payer: Self-pay

## 2017-03-12 NOTE — Telephone Encounter (Signed)
Received mychart message about WPW and pain in left breast area. Called, pt unavailable. Left voice message to please call our office back asap to discuss.

## 2017-03-12 NOTE — Telephone Encounter (Signed)
Called, spoke with pt. Pt described the upper left breast area as "pinching and squeezing". Pt stated when it happens it "takes her breath away." Forwarded to DOD to advise. Informed pt Dr. Eden EmmsNishan (DOD) recommended to forward information to Dr. Ladona Ridgelaylor to advise. Informed Dr. Ladona Ridgelaylor is in the office tomorrow, and I will call pt tomorrow with Dr.Taylor's recommendation. Reminded pt to call our office with cardiac symptoms, versus sending through mychart. Pt verbalized understanding.

## 2017-03-13 NOTE — Telephone Encounter (Signed)
Called, pt unavailable. Left voice message. Informed Dr. Ladona Ridgelaylor stated left upper breat area pain was likely not r/t to WPW unless heart racing fast. Informed if pt is experiencing rapid HR, call our office to schedule ov with Dr. Ladona Ridgelaylor.

## 2017-03-19 ENCOUNTER — Other Ambulatory Visit: Payer: Self-pay | Admitting: Internal Medicine

## 2017-03-19 DIAGNOSIS — M1990 Unspecified osteoarthritis, unspecified site: Secondary | ICD-10-CM | POA: Diagnosis not present

## 2017-03-19 DIAGNOSIS — R0789 Other chest pain: Secondary | ICD-10-CM | POA: Diagnosis not present

## 2017-03-19 DIAGNOSIS — M25652 Stiffness of left hip, not elsewhere classified: Secondary | ICD-10-CM | POA: Diagnosis not present

## 2017-03-19 DIAGNOSIS — Z6836 Body mass index (BMI) 36.0-36.9, adult: Secondary | ICD-10-CM | POA: Diagnosis not present

## 2017-03-19 DIAGNOSIS — M79672 Pain in left foot: Secondary | ICD-10-CM | POA: Diagnosis not present

## 2017-03-19 DIAGNOSIS — N644 Mastodynia: Secondary | ICD-10-CM

## 2017-03-19 DIAGNOSIS — E784 Other hyperlipidemia: Secondary | ICD-10-CM | POA: Diagnosis not present

## 2017-03-19 DIAGNOSIS — I10 Essential (primary) hypertension: Secondary | ICD-10-CM | POA: Diagnosis not present

## 2017-03-19 DIAGNOSIS — E119 Type 2 diabetes mellitus without complications: Secondary | ICD-10-CM | POA: Diagnosis not present

## 2017-03-21 ENCOUNTER — Ambulatory Visit
Admission: RE | Admit: 2017-03-21 | Discharge: 2017-03-21 | Disposition: A | Discharge status: Home / Self Care | Payer: 59 | Source: Ambulatory Visit | Attending: Internal Medicine | Admitting: Internal Medicine

## 2017-03-21 ENCOUNTER — Other Ambulatory Visit: Payer: Self-pay | Admitting: Internal Medicine

## 2017-03-21 DIAGNOSIS — R928 Other abnormal and inconclusive findings on diagnostic imaging of breast: Secondary | ICD-10-CM | POA: Diagnosis not present

## 2017-03-21 DIAGNOSIS — M79621 Pain in right upper arm: Secondary | ICD-10-CM

## 2017-03-21 DIAGNOSIS — N644 Mastodynia: Secondary | ICD-10-CM

## 2017-03-31 DIAGNOSIS — M25462 Effusion, left knee: Secondary | ICD-10-CM | POA: Diagnosis not present

## 2017-03-31 DIAGNOSIS — M25552 Pain in left hip: Secondary | ICD-10-CM | POA: Diagnosis not present

## 2017-04-08 MED FILL — INVOKANA 300 MG TABLET: 300 | 30 days supply | Qty: 30 | Fill #1

## 2017-04-09 IMAGING — CR DG CHEST 2V
2 series · 2 of 2 positions shown · non-contrast
Comparison: 11/03/2014

CLINICAL DATA: Sharp left-sided chest pain and nausea.

EXAM:
CHEST  2 VIEW

[chest pa]
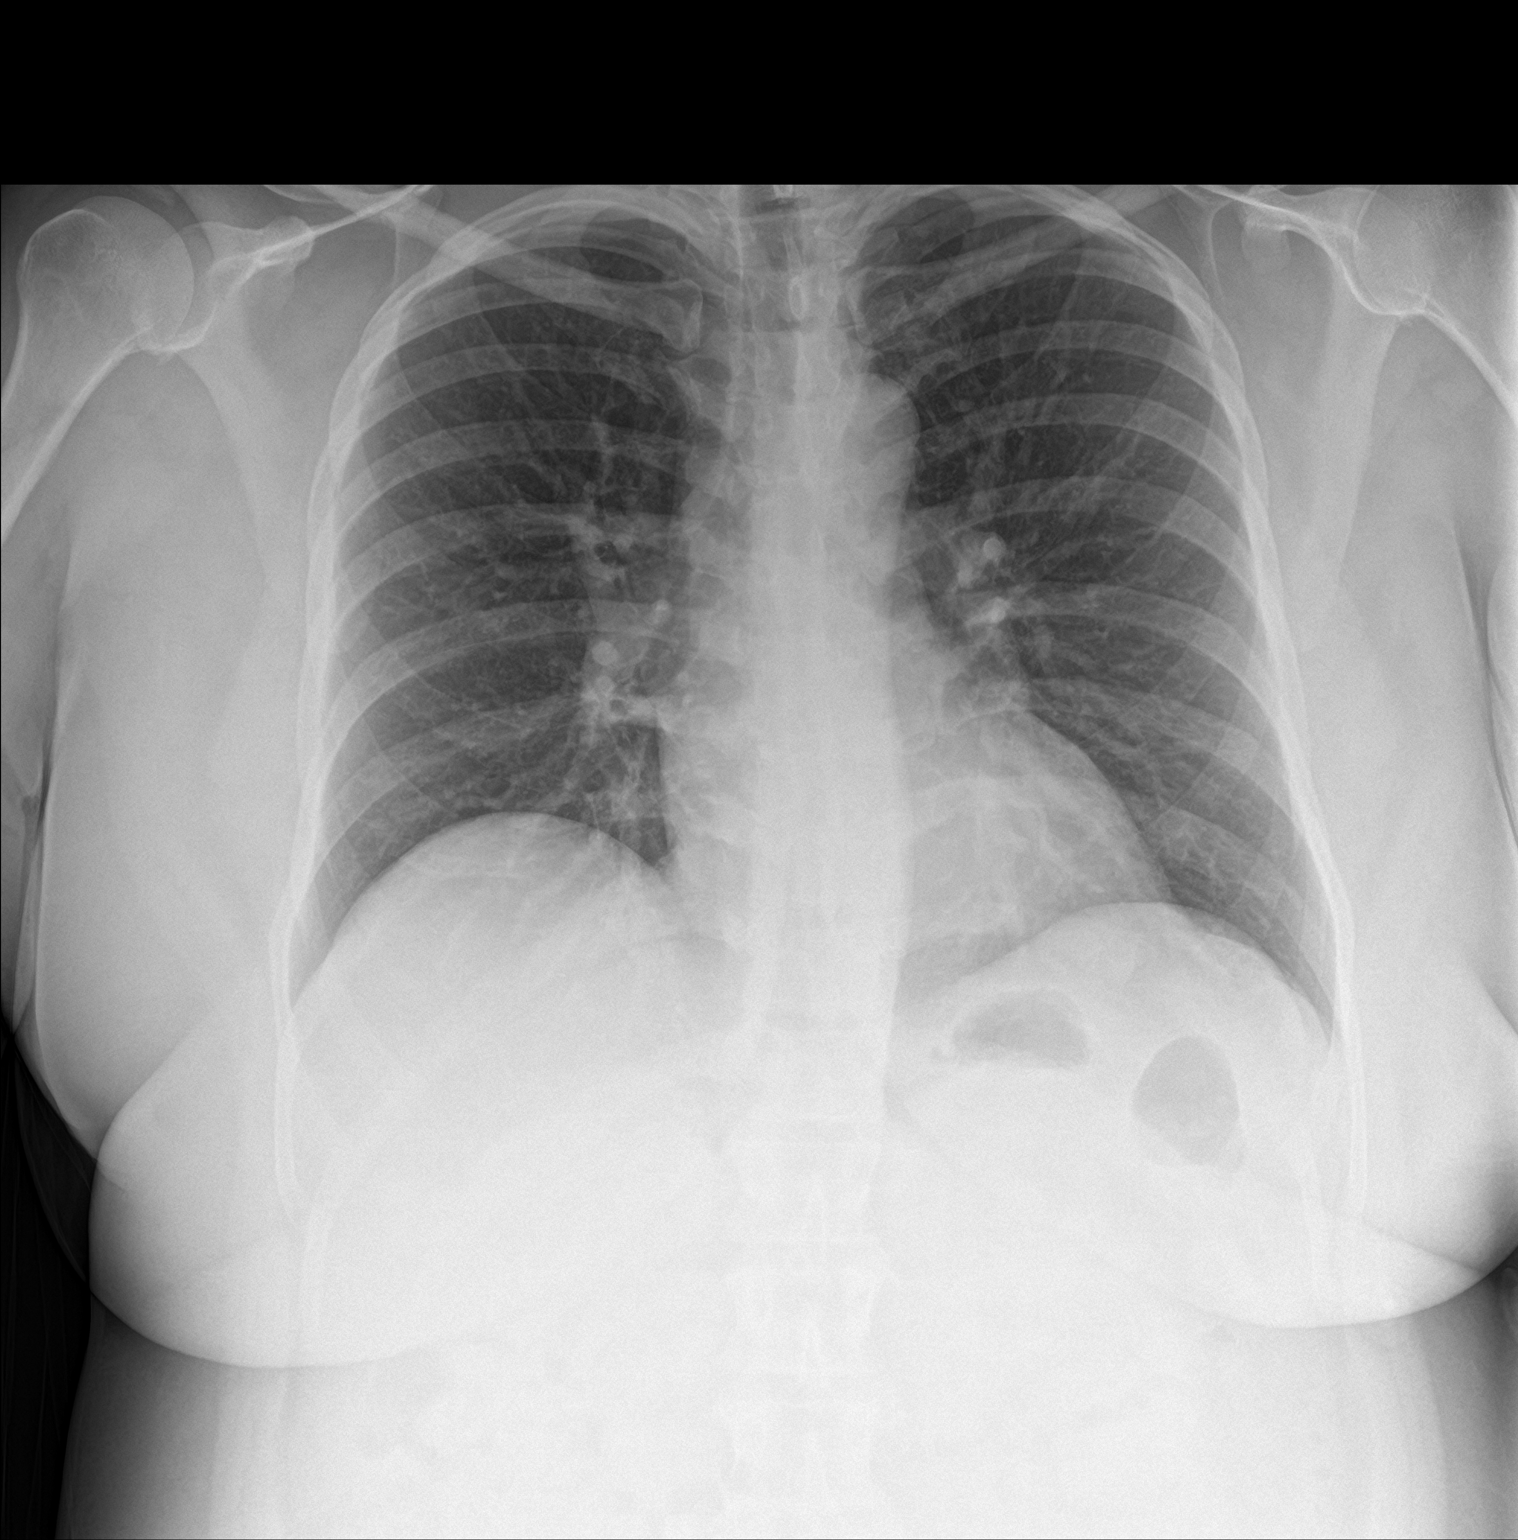

[chest lat]
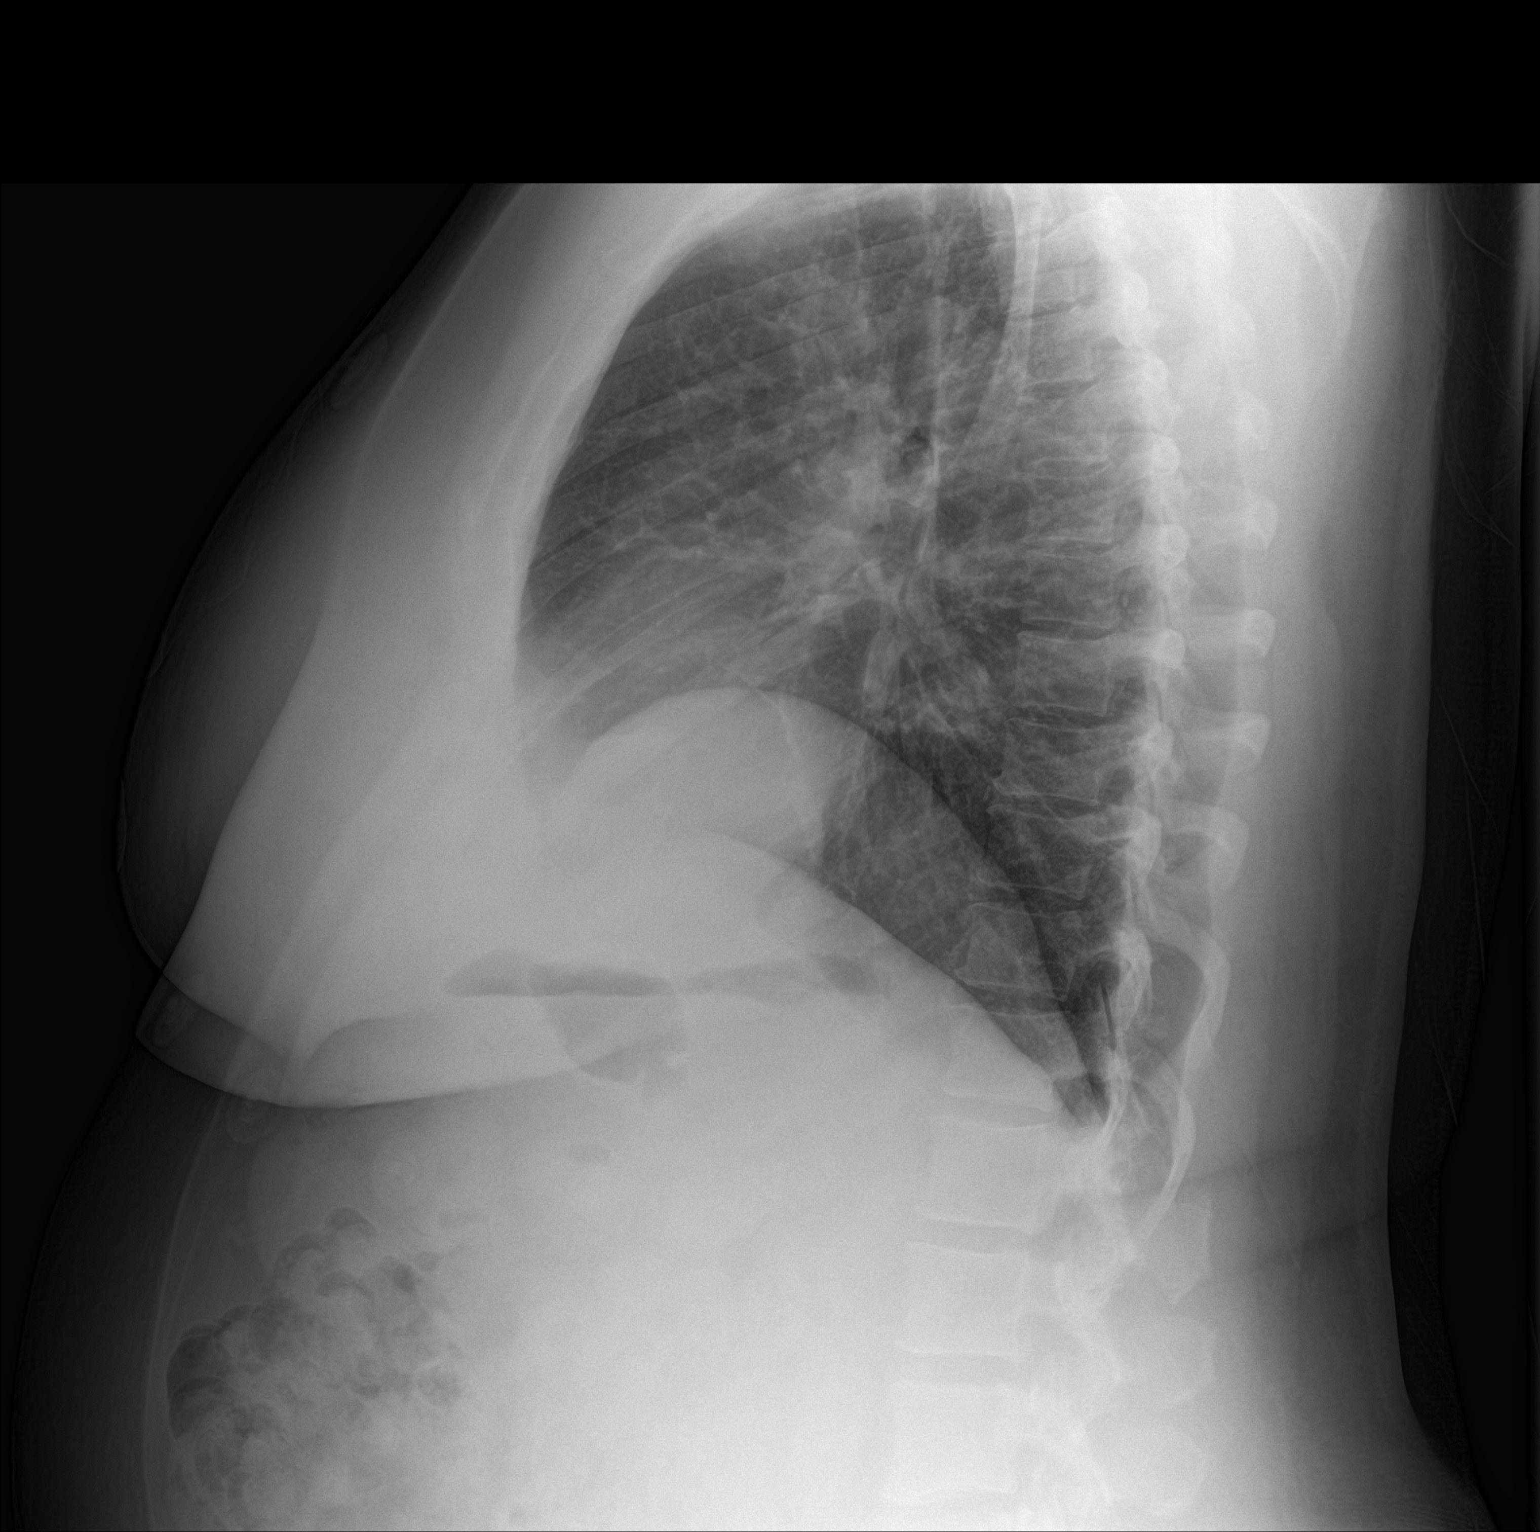

[2 of 2 positions shown; findings below may reference images not displayed]

FINDINGS: There is stable right hemidiaphragm elevation. The lungs are clear.
There is no pleural effusion. Hilar, mediastinal and cardiac
contours are unremarkable and unchanged. Pulmonary vasculature is
normal.
IMPRESSION: No active cardiopulmonary disease.

## 2017-04-14 ENCOUNTER — Encounter (INDEPENDENT_AMBULATORY_CARE_PROVIDER_SITE_OTHER): Payer: Self-pay | Admitting: Orthopaedic Surgery

## 2017-04-14 ENCOUNTER — Ambulatory Visit (INDEPENDENT_AMBULATORY_CARE_PROVIDER_SITE_OTHER): Payer: Self-pay

## 2017-04-14 ENCOUNTER — Ambulatory Visit (INDEPENDENT_AMBULATORY_CARE_PROVIDER_SITE_OTHER): Payer: 59 | Admitting: Orthopaedic Surgery

## 2017-04-14 DIAGNOSIS — M25562 Pain in left knee: Secondary | ICD-10-CM

## 2017-04-14 NOTE — Progress Notes (Signed)
Office Visit Note   Patient: Carolyn Phillips           Date of Birth: 1975-12-19           MRN: [id number] Visit Date: 04/14/2017              Requested by: Claudell Kyle, MD [address] [place], [place] [address] PCP: Claudell Kyle, MD   Assessment & Plan: Visit Diagnoses:  1. Left knee pain, unspecified chronicity     Plan: This is certainly a difficult situation given her young age. I prepped her knee with Betadine and alcohol and after 5 mL lidocaine to about 25-30 mL of serous fluid off of the knee. This looks like more of an arthritic process. The next I will be to try hyaluronic acid to see if this will help. She is agreeable to this as well. The only other option would be likely a knee arthroscopy. We will try a hinged knee braces well to help with stability issues. All questions were encouraged and answered. Her next visit hopefully we will be placing a hyaluronic acid injection to her left knee.  Follow-Up Instructions: Return in about 4 weeks (around 05/12/2017).   Orders:  No orders of the defined types were placed in this encounter.  No orders of the defined types were placed in this encounter.     Procedures: No procedures performed   Clinical Data: No additional findings.   Subjective: Chief Complaint  Patient presents with  . Left Knee - Pain    History Arthroscopic surgery 2016 with Dr. Turner Daniels.  Now its buckling, crackling, popping, swelling and painful.  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 Geronimo Running been having left knee problems for some time now. She actually has a remote history of a left knee arthroscopy. I have the operative note on the cone system for me to review. She actually has a arthroscopy pictures as well. She had to have microfracture surgery in the lateral compartment of her knee. She had done well for a while but it is expanders worsening swelling again. She had she went to the urgent care  center earlier this month and had a large effusion drain from her left knee. She had a steroid put in at that point as well. She is a type II diabetic and it did bump her sugars up. She still having significant amount of knee swelling and pain. She's been having problems going up and downstairs and some locking in her knee as well. It is beginning to detrimentally affects her work and her activity is daily living.  HPI  Review of Systems She currently denies any headache, chest pain, short of breath, fever, chills, nausea, vomiting.  Objective: Vital Signs: There were no vitals taken for this visit.  Physical Exam She is alert or 3 and in no acute distress Ortho Exam Examination of her left knee shows no evidence of infection but there is a moderate effusion. The knee is slightly warm. She is a painful arc of motion but no instability of the knee. Specialty Comments:  No specialty comments available.  Imaging: No results found. Recent x-rays and patellar views of her left knee show well-maintained joint space. She did show me the arthroscopy pictures and I can see where microfracture surgery was performed.  PMFS History: Patient Active Problem List   Diagnosis Date Noted  . Dysuria 10/24/2016  . Paresthesias 06/02/2016  . Diabetes mellitus without complication (HCC)   .  Hyperlipemia   . Chest pain 01/11/2016  . Diabetes mellitus (HCC) 01/11/2016  . Dyslipidemia 01/11/2016  . Hypokalemia 01/11/2016  . Wolff-Parkinson-White syndrome 07/04/2014  . Wolff-Parkinson-White (WPW) syndrome 07/04/2014  . Hypertension 05/25/2014  . WPW (Wolff-Parkinson-White syndrome) 05/25/2014  . Sleep apnea 05/25/2014   Past Medical History:  Diagnosis Date  . Beta thalassemia trait   . Complication of anesthesia   . Diabetes mellitus without complication (HCC)   . Dysrhythmia    WPW-ablation  . GERD (gastroesophageal reflux disease)   . Headache   . Hyperlipemia   . Hypertension   . Obesity    . PONV (postoperative nausea and vomiting)   . Wolff-Parkinson-White syndrome     Family History  Problem Relation Age of Onset  . Valvular heart disease Mother        Aortic valve replacement  . Diabetes Mother   . Heart attack Maternal Grandmother        50s  . Stroke Maternal Grandmother   . Diabetes Sister   . Breast cancer Sister        Half Sister  . Diabetes Brother   . Breast cancer Paternal Aunt   . Breast cancer Cousin 35    Past Surgical History:  Procedure Laterality Date  . ABDOMINAL HYSTERECTOMY    . BLADDER REPAIR    . BREAST REDUCTION SURGERY    . CHONDROPLASTY Left 09/13/2015   Procedure: CHONDROPLASTY;  Surgeon: Gean BirchwoodFrank Rowan, MD;  Location: [facility];  Service: Orthopedics;  Laterality: Left;  . KNEE ARTHROSCOPY WITH DRILLING/MICROFRACTURE Left 09/13/2015   Procedure: KNEE ARTHROSCOPY WITH DRILLING/MICROFRACTURE;  Surgeon: Gean BirchwoodFrank Rowan, MD;  Location: [facility];  Service: Orthopedics;  Laterality: Left;  . SUPRAVENTRICULAR TACHYCARDIA ABLATION N/A 07/04/2014   Procedure: WPW ABLATION;  Surgeon: Marinus MawGregg W Taylor, MD;  [facility];  Service: Cardiovascular;  Laterality: N/A;  . TUBAL LIGATION     Social History   Occupational History  . 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 Smoking status: Never Smoker  . Smokeless tobacco: Never Used     Comment: "only smoked for one year al ong time ago "  . Alcohol use 0.0 oz/week     Comment: occasional  . Drug use: No  . Sexual activity: Not on file

## 2017-04-16 DIAGNOSIS — Z0000 Encounter for general adult medical examination without abnormal findings: Secondary | ICD-10-CM | POA: Diagnosis not present

## 2017-04-16 DIAGNOSIS — E119 Type 2 diabetes mellitus without complications: Secondary | ICD-10-CM | POA: Diagnosis not present

## 2017-04-21 ENCOUNTER — Telehealth (INDEPENDENT_AMBULATORY_CARE_PROVIDER_SITE_OTHER): Payer: Self-pay | Admitting: Orthopaedic Surgery

## 2017-04-21 NOTE — Telephone Encounter (Signed)
Called patient left message to return call

## 2017-04-22 DIAGNOSIS — M1990 Unspecified osteoarthritis, unspecified site: Secondary | ICD-10-CM | POA: Diagnosis not present

## 2017-04-22 DIAGNOSIS — I456 Pre-excitation syndrome: Secondary | ICD-10-CM | POA: Diagnosis not present

## 2017-04-22 DIAGNOSIS — Z0000 Encounter for general adult medical examination without abnormal findings: Secondary | ICD-10-CM | POA: Diagnosis not present

## 2017-04-22 DIAGNOSIS — Z1389 Encounter for screening for other disorder: Secondary | ICD-10-CM | POA: Diagnosis not present

## 2017-04-22 DIAGNOSIS — A084 Viral intestinal infection, unspecified: Secondary | ICD-10-CM | POA: Diagnosis not present

## 2017-04-22 DIAGNOSIS — E784 Other hyperlipidemia: Secondary | ICD-10-CM | POA: Diagnosis not present

## 2017-04-22 DIAGNOSIS — E119 Type 2 diabetes mellitus without complications: Secondary | ICD-10-CM | POA: Diagnosis not present

## 2017-04-22 DIAGNOSIS — G5601 Carpal tunnel syndrome, right upper limb: Secondary | ICD-10-CM | POA: Diagnosis not present

## 2017-04-22 DIAGNOSIS — I10 Essential (primary) hypertension: Secondary | ICD-10-CM | POA: Diagnosis not present

## 2017-05-13 MED FILL — INVOKANA 300 MG TABLET: 300 | 30 days supply | Qty: 30 | Fill #2

## 2017-05-13 MED FILL — BYSTOLIC 5 MG TABLET: 5 | 90 days supply | Qty: 90 | Fill #1

## 2017-05-14 ENCOUNTER — Ambulatory Visit (INDEPENDENT_AMBULATORY_CARE_PROVIDER_SITE_OTHER): Payer: 59 | Admitting: Physician Assistant

## 2017-05-14 DIAGNOSIS — M1712 Unilateral primary osteoarthritis, left knee: Secondary | ICD-10-CM | POA: Diagnosis not present

## 2017-05-14 MED ORDER — HYALURONAN 88 MG/4ML IX SOSY
88.0000 mg | PREFILLED_SYRINGE | INTRA_ARTICULAR | Status: AC | PRN
  Administered 2017-05-14: 88 mg via INTRA_ARTICULAR

## 2017-05-14 NOTE — Progress Notes (Signed)
   Procedure Note  Patient: Carolyn Phillips             Date of Birth: [birth year]           MRN: [id number]             Visit Date: 05/14/2017   HPI:  [name] 41 year old female comes in today for Orthovisc injection in her left knee. She is failed conservative treatment including a knee brace and cortisone injection. She's also undergone microfracture for lateral compartment of her knee. Procedures: Visit Diagnoses: Primary osteoarthritis of left knee  Large Joint Inj Date/Time: 05/14/2017 8:57 AM Performed by: Kirtland BouchardLARK, GILBERT W Authorized by: Kirtland BouchardLARK, GILBERT W   Consent Given by:  Patient Indications:  Pain Location:  Knee Site:  L knee Needle Size:  22 G Approach:  Anterolateral Ultrasound Guidance: No   Fluoroscopic Guidance: No   Medications:  88 mg Hyaluronan 88 MG/4ML Aspiration Attempted: No   Patient tolerance:  Patient tolerated the procedure well with no immediate complications   Plan: Activities as tolerated. Over-the-counter NSAIDs and ice today and tomorrow. Follow-up with us in 8 weeks' check progress lack of.

## 2017-06-05 ENCOUNTER — Ambulatory Visit (INDEPENDENT_AMBULATORY_CARE_PROVIDER_SITE_OTHER): Payer: Self-pay | Admitting: Neurology

## 2017-06-05 ENCOUNTER — Ambulatory Visit (INDEPENDENT_AMBULATORY_CARE_PROVIDER_SITE_OTHER): Payer: 59 | Admitting: Neurology

## 2017-06-05 DIAGNOSIS — G5600 Carpal tunnel syndrome, unspecified upper limb: Secondary | ICD-10-CM | POA: Diagnosis not present

## 2017-06-05 DIAGNOSIS — Z0289 Encounter for other administrative examinations: Secondary | ICD-10-CM

## 2017-06-05 NOTE — Progress Notes (Signed)
Full Name: Carolyn Phillips Gender: Female MRN #: [id number] Date of Birth: 04/16/1976    Visit Date: 06/05/17 09:09 Age: 41 Years 10 Months Old Examining Physician: Naomie Dean, MD  Referring Physician: Dr. Burnadette Pop    History: Here for evaluation of hand pain.  Summary: The right median/ulnar (palm) comparison nerve showed prolonged distal peak latency (Median Palm, 2.5 ms, N<2.2) and abnormal peak latency difference (Median Palm-Ulnar Palm, 0.5 ms, N<0.4) with a relative median delay. The right median orthodromic sensory nerve showed prolonged distal peak latency(3.16ms, N<3.4).   Conclusion: There is right mild Carpal-Tunnel syndrome. No suggestion of radiculopathy or polyneuropathy.  Orders Placed This Encounter  Procedures  . Ambulatory referral to Orthopedic Surgery    Naomie Dean, M.D.  [facility] [address] [place], [place] [address] Tel: [phone] Fax: [phone]        [facility]    Nerve / Sites Muscle Latency Ref. Amplitude Ref. Rel Amp Segments Distance Velocity Ref. Area    ms ms mV mV %  cm m/s m/s mVms  L Median - APB     Wrist APB 3.2 ?4.4 6.9 ?4.0 100 Wrist - APB 7   21.2     Upper arm APB 7.1  6.6  95.2 Upper arm - Wrist 22 57 ?49 20.4  R Median - APB     Wrist APB 3.6 ?4.4 6.1 ?4.0 100 Wrist - APB 7   18.7     Upper arm APB 7.6  5.5  91.1 Upper arm - Wrist 22 55 ?49 18.2  L Ulnar - ADM     Wrist ADM 2.4 ?3.3 9.2 ?6.0 100 Wrist - ADM 7   26.1     B.Elbow ADM 6.0  8.4  91.3 B.Elbow - Wrist 18 50 ?49 26.3     A.Elbow ADM 8.0  7.9  94.5 A.Elbow - B.Elbow 10 52 ?49 25.3         A.Elbow - Wrist      R Ulnar - ADM     Wrist ADM 2.6 ?3.3 7.1 ?6.0 100 Wrist - ADM 7   20.6     B.Elbow ADM 6.1  7.0  98.5 B.Elbow - Wrist 18 51 ?49 20.7     A.Elbow ADM 7.9  6.9  99.1 A.Elbow - B.Elbow 10 56 ?49 20.7         A.Elbow - Wrist                 SNC    Nerve / Sites Rec. Site Peak Lat Ref.  Amp Ref. Segments Distance Peak  Diff Ref.    ms ms V V  cm ms ms  L Median, Ulnar - Transcarpal comparison     Median Palm Wrist 2.0 ?2.2 37 ?35 Median Palm - Wrist 8       Ulnar Palm Wrist 1.9 ?2.2 13 ?12 Ulnar Palm - Wrist 8          Median Palm - Ulnar Palm  0.2 ?0.4  R Median, Ulnar - Transcarpal comparison     Median Palm Wrist 2.5 ?2.2 58 ?35 Median Palm - Wrist 8       Ulnar Palm Wrist 2.0 ?2.2 13 ?12 Ulnar Palm - Wrist 8          Median Palm - Ulnar Palm  0.5 ?0.4  L Median - Orthodromic (Dig II, Mid palm)     Dig II Wrist 2.8 ?  3.4 12 ?10 Dig II - Wrist 13    R Median - Orthodromic (Dig II, Mid palm)     Dig II Wrist 3.4 ?3.4 14 ?10 Dig II - Wrist 13    L Ulnar - Orthodromic, (Dig V, Mid palm)     Dig V Wrist 2.4 ?3.1 7 ?5 Dig V - Wrist 11    R Ulnar - Orthodromic, (Dig V, Mid palm)     Dig V Wrist 2.6 ?3.1 6 ?5 Dig V - Wrist 3311                   F  Wave    Nerve F Lat Ref.   ms ms  L Ulnar - ADM 27.1 ?32.0  R Ulnar - ADM 26.0 ?32.0         EMG full       EMG Summary Table    Spontaneous MUAP Recruitment  Muscle IA Fib PSW Fasc Other Amp Dur. Poly Pattern  R. Deltoid Normal None None None _______ Normal Normal Normal Normal  R. Triceps brachii Normal None None None _______ Normal Normal Normal Normal  R. Pronator teres Normal None None None _______ Normal Normal Normal Normal  R. First dorsal interosseous Normal None None None _______ Normal Normal Normal Normal  R. Opponens pollicis Normal None None None _______ Normal Normal Normal Normal  R. Cervical paraspinals (low) Normal None None None _______ Normal Normal Normal Normal

## 2017-06-08 NOTE — Procedures (Signed)
Full Name: Latina Frank Gender: Female MRN #: [id number] Date of Birth: 12-Sep-1976    Visit Date: 06/05/17 09:09 Age: 41 Years 10 Months Old Examining Physician: Naomie Dean, MD  Referring Physician: Dr. Burnadette Pop    History: Here for evaluation of hand pain.  Summary: The right median/ulnar (palm) comparison nerve showed prolonged distal peak latency (Median Palm, 2.5 ms, N<2.2) and abnormal peak latency difference (Median Palm-Ulnar Palm, 0.5 ms, N<0.4) with a relative median delay. The right median orthodromic sensory nerve showed prolonged distal peak latency(3.35ms, N<3.4).   Conclusion: There is right mild Carpal-Tunnel syndrome. No suggestion of radiculopathy or polyneuropathy.  Orders Placed This Encounter  Procedures  . Ambulatory referral to Orthopedic Surgery   Cc: Dr. Dwana Melena, Darleen Crocker, M.D.  [facility] [address] [place], [place] [address] Tel: [phone] Fax: [phone]        [facility]    Nerve / Sites Muscle Latency Ref. Amplitude Ref. Rel Amp Segments Distance Velocity Ref. Area    ms ms mV mV %  cm m/s m/s mVms  L Median - APB     Wrist APB 3.2 ?4.4 6.9 ?4.0 100 Wrist - APB 7   21.2     Upper arm APB 7.1  6.6  95.2 Upper arm - Wrist 22 57 ?49 20.4  R Median - APB     Wrist APB 3.6 ?4.4 6.1 ?4.0 100 Wrist - APB 7   18.7     Upper arm APB 7.6  5.5  91.1 Upper arm - Wrist 22 55 ?49 18.2  L Ulnar - ADM     Wrist ADM 2.4 ?3.3 9.2 ?6.0 100 Wrist - ADM 7   26.1     B.Elbow ADM 6.0  8.4  91.3 B.Elbow - Wrist 18 50 ?49 26.3     A.Elbow ADM 8.0  7.9  94.5 A.Elbow - B.Elbow 10 52 ?49 25.3         A.Elbow - Wrist      R Ulnar - ADM     Wrist ADM 2.6 ?3.3 7.1 ?6.0 100 Wrist - ADM 7   20.6     B.Elbow ADM 6.1  7.0  98.5 B.Elbow - Wrist 18 51 ?49 20.7     A.Elbow ADM 7.9  6.9  99.1 A.Elbow - B.Elbow 10 56 ?49 20.7         A.Elbow - Wrist                 SNC    Nerve / Sites Rec. Site Peak Lat Ref.  Amp  Ref. Segments Distance Peak Diff Ref.    ms ms V V  cm ms ms  L Median, Ulnar - Transcarpal comparison     Median Palm Wrist 2.0 ?2.2 37 ?35 Median Palm - Wrist 8       Ulnar Palm Wrist 1.9 ?2.2 13 ?12 Ulnar Palm - Wrist 8          Median Palm - Ulnar Palm  0.2 ?0.4  R Median, Ulnar - Transcarpal comparison     Median Palm Wrist 2.5 ?2.2 58 ?35 Median Palm - Wrist 8       Ulnar Palm Wrist 2.0 ?2.2 13 ?12 Ulnar Palm - Wrist 8          Median Palm - Ulnar Palm  0.5 ?0.4  L Median - Orthodromic (Dig II, Mid palm)  Dig II Wrist 2.8 ?3.4 12 ?10 Dig II - Wrist 13    R Median - Orthodromic (Dig II, Mid palm)     Dig II Wrist 3.4 ?3.4 14 ?10 Dig II - Wrist 13    L Ulnar - Orthodromic, (Dig V, Mid palm)     Dig V Wrist 2.4 ?3.1 7 ?5 Dig V - Wrist 11    R Ulnar - Orthodromic, (Dig V, Mid palm)     Dig V Wrist 2.6 ?3.1 6 ?5 Dig V - Wrist 3711                   F  Wave    Nerve F Lat Ref.   ms ms  L Ulnar - ADM 27.1 ?32.0  R Ulnar - ADM 26.0 ?32.0         EMG full       EMG Summary Table    Spontaneous MUAP Recruitment  Muscle IA Fib PSW Fasc Other Amp Dur. Poly Pattern  R. Deltoid Normal None None None _______ Normal Normal Normal Normal  R. Triceps brachii Normal None None None _______ Normal Normal Normal Normal  R. Pronator teres Normal None None None _______ Normal Normal Normal Normal  R. First dorsal interosseous Normal None None None _______ Normal Normal Normal Normal  R. Opponens pollicis Normal None None None _______ Normal Normal Normal Normal  R. Cervical paraspinals (low) Normal None None None _______ Normal Normal Normal Normal

## 2017-06-08 NOTE — Progress Notes (Signed)
See procedure note.

## 2017-06-10 ENCOUNTER — Telehealth: Payer: Self-pay | Admitting: Neurology

## 2017-06-10 NOTE — Telephone Encounter (Signed)
Felecia @ [facility] called to inform that pt has unresolved matters with the office and they will not be able to schedule her at this time.  Felecia is also asking that pt be called and if she would like to resolve the matter with the office to be seen she can call them at [phone]

## 2017-06-11 NOTE — Telephone Encounter (Signed)
Spoke to pt and she has contacted the [facility] and it is being worked out.  For now the referral is on hold.

## 2017-06-11 NOTE — Telephone Encounter (Signed)
LMVM for pt to return call.   

## 2017-06-11 NOTE — Telephone Encounter (Signed)
Patient is returning your call.  

## 2017-06-27 ENCOUNTER — Ambulatory Visit (INDEPENDENT_AMBULATORY_CARE_PROVIDER_SITE_OTHER): Payer: 59 | Admitting: Endocrinology

## 2017-06-27 ENCOUNTER — Encounter: Payer: Self-pay | Admitting: Endocrinology

## 2017-06-27 VITALS — BP 118/80 | HR 76 | Wt 208.8 lb

## 2017-06-27 DIAGNOSIS — E118 Type 2 diabetes mellitus with unspecified complications: Secondary | ICD-10-CM | POA: Diagnosis not present

## 2017-06-27 LAB — POCT GLYCOSYLATED HEMOGLOBIN (HGB A1C): HEMOGLOBIN A1C: 6.6

## 2017-06-27 MED ORDER — EMPAGLIFLOZIN 25 MG PO TABS: 25.0000 mg | ORAL_TABLET | Freq: Every day | ORAL | 11 refills | Status: DC

## 2017-06-27 NOTE — Patient Instructions (Addendum)
Please continue the same medications.   If necessary, we can add back the janumet, at a lower dosage.  check your blood sugar once a day.  vary the time of day when you check, between before the 3 meals, and at bedtime.  also check if you have symptoms of your blood sugar being too high or too low.  please keep a record of the readings and bring it to your next appointment here (or you can bring the meter itself).  You can write it on any piece of paper.  please call us sooner if your blood sugar goes below 70, or if you have a lot of readings over 200.

## 2017-06-27 NOTE — Progress Notes (Signed)
Subjective:    Patient ID: Carolyn Phillips, female    DOB: Aug 24, 1976, 41 y.o.   MRN: [id number]  HPI Pt returns for f/u of diabetes mellitus:  DM type: 2 Dx'ed: 2013 Complications: none.  Therapy: 2 oral meds.   GDM: never.  DKA: never Severe hypoglycemia: never.  Pancreatitis: never.  Other: she has never been on insulin; diarrhea, edema, and vertigo limit oral rx options.   Interval history: pt states she feels well in general.  cbg's are well-controlled.  There is no trend throughout the day.  She did not tolerate janumet (diarrhea).  Ins no covers invokana.  Past Medical History:  Diagnosis Date  . Beta thalassemia trait   . Complication of anesthesia   . Diabetes mellitus without complication (HCC)   . Dysrhythmia    WPW-ablation  . GERD (gastroesophageal reflux disease)   . Headache   . Hyperlipemia   . Hypertension   . Obesity   . PONV (postoperative nausea and vomiting)   . Wolff-Parkinson-White syndrome     Past Surgical History:  Procedure Laterality Date  . ABDOMINAL HYSTERECTOMY    . BLADDER REPAIR    . BREAST REDUCTION SURGERY    . CHONDROPLASTY Left 09/13/2015   Procedure: CHONDROPLASTY;  Surgeon: Gean Birchwood, MD;  Location: [facility];  Service: Orthopedics;  Laterality: Left;  . KNEE ARTHROSCOPY WITH DRILLING/MICROFRACTURE Left 09/13/2015   Procedure: KNEE ARTHROSCOPY WITH DRILLING/MICROFRACTURE;  Surgeon: Gean Birchwood, MD;  Location: [facility];  Service: Orthopedics;  Laterality: Left;  . SUPRAVENTRICULAR TACHYCARDIA ABLATION N/A 07/04/2014   Procedure: WPW ABLATION;  Surgeon: Marinus Maw, MD;  Location: [facility] C[facility] LAB;  Service: Cardiovascular;  Laterality: N/A;  . TUBAL LIGATION      Social History   Social History  . Marital status: Legally Separated    Spouse name: N/A  . Number of children: 2  . Years of education: College   Occupational History  . works at [employer], student    Social History  Main Topics  . Smoking status: Never Smoker  . Smokeless tobacco: Never Used     Comment: "only smoked for one year al ong time ago "  . Alcohol use 0.0 oz/week     Comment: occasional  . Drug use: No  . Sexual activity: Not on file   Other Topics Concern  . Not on file   Social History Narrative   Pt lives with mother, children and husband.   Caffeine use: small coffee per day   1 mountain dew per day    Current Outpatient Prescriptions on File Prior to Visit  Medication Sig Dispense Refill  . Biotin 2500 MCG CAPS Take 2 capsules by mouth daily.    . Blood Glucose Monitoring Suppl (FREESTYLE LITE) DEVI Use to check blood sugar 1 time per day. Dx Code: E11.9 1 each 2  . cetirizine (ZYRTEC) 10 MG tablet Take 10 mg by mouth daily.    . Cholecalciferol (VITAMIN D3) 3000 UNITS TABS Take 5,000 Units by mouth daily.     . Cyanocobalamin (VITAMIN B 12 PO) Take 1 tablet by mouth daily.    . diazepam (VALIUM) 5 MG tablet Take 1 tablet (5 mg total) by mouth every 12 (twelve) hours as needed for anxiety (vertigo). 10 tablet 0  . glucose blood (FREESTYLE LITE) test strip Use to check blood sugar 1 time per day. Dx Code: E11.9 100 each 2  . hydrochlorothiazide (HYDRODIURIL) 25 MG tablet  hydrochlorothiazide 25 mg tablet  TK 1 T PO ONCE A DAY    . Lancets (FREESTYLE) lancets Use to check blood sugar 1 time per day. Dx Code: E11.9 100 each 2  . meclizine (ANTIVERT) 25 MG tablet meclizine 25 mg tablet  TK 1 T  PO TID PRN FOR DIZZINESS    . methocarbamol (ROBAXIN) 500 MG tablet methocarbamol 500 mg tablet  TK 2 TS PO QID PRF SPASM    . Multiple Vitamins-Minerals (ZINC PO) Take 1 tablet by mouth daily.    . nebivolol (BYSTOLIC) 5 MG tablet Take 1 tablet (5 mg) daily as needed 30 tablet 11  . ondansetron (ZOFRAN-ODT) 4 MG disintegrating tablet ondansetron 4 mg disintegrating tablet  DIS ONE T PO  Q 8 H PRN FOR NAUSEA    . oseltamivir (TAMIFLU) 75 MG capsule oseltamivir 75 mg capsule    .  repaglinide (PRANDIN) 0.5 MG tablet Take 1 tablet (0.5 mg total) by mouth daily with supper. 90 tablet 3  . rosuvastatin (CRESTOR) 5 MG tablet Take 5 mg by mouth daily.     . traMADol (ULTRAM) 50 MG tablet tramadol 50 mg tablet  TK 1 T PO Q 4 TO 6 H PRN P    . VITAMIN A PO Take 1 tablet by mouth daily.     No current facility-administered medications on file prior to visit.     Allergies  Allergen Reactions  . Penicillins Hives and Swelling    Has patient had a PCN reaction causing immediate rash, facial/tongue/throat swelling, SOB or lightheadedness with hypotension: Yes Has patient had a PCN reaction causing severe rash involving mucus membranes or skin necrosis: No Has patient had a PCN reaction that required hospitalization No Has patient had a PCN reaction occurring within the last 10 years: No If all of the above answers are "NO", then may proceed with Cephalosporin use.   Eye swelling  . Metoprolol     "LOL" Drugs cause hair to fall out  . Nitrofurantoin Macrocrystal Other (See Comments)  . Other Nausea And Vomiting    General Anesthesia     Family History  Problem Relation Age of Onset  . Valvular heart disease Mother        Aortic valve replacement  . Diabetes Mother   . Heart attack Maternal Grandmother        50s  . Stroke Maternal Grandmother   . Diabetes Sister   . Breast cancer Sister        Half Sister  . Diabetes Brother   . Breast cancer Paternal Aunt   . Breast cancer Cousin 35    BP 118/80   Pulse 76   Wt 208 lb 12.8 oz (94.7 kg)   SpO2 96%   BMI 38.19 kg/m    Review of Systems She denies hypoglycemia.     Objective:   Physical Exam VITAL SIGNS:  See vs page GENERAL: no distress Pulses: foot pulses are intact bilaterally.   MSK: no deformity of the feet or ankles.  CV: no edema of the legs or ankles Skin:  no ulcer on the feet or ankles.  normal color and temp on the feet and ankles Neuro: sensation is intact to touch on the feet and  ankles.    A1c=6.6%    Assessment & Plan:  Type 2 DM: well-controlled. Edema: although this is improved, it limits rx options  Patient Instructions  Please continue the same medications.   If necessary, we can add  back the janumet, at a lower dosage.  check your blood sugar once a day.  vary the time of day when you check, between before the 3 meals, and at bedtime.  also check if you have symptoms of your blood sugar being too high or too low.  please keep a record of the readings and bring it to your next appointment here (or you can bring the meter itself).  You can write it on any piece of paper.  please call us sooner if your blood sugar goes below 70, or if you have a lot of readings over 200.

## 2017-07-08 ENCOUNTER — Other Ambulatory Visit (INDEPENDENT_AMBULATORY_CARE_PROVIDER_SITE_OTHER): Payer: Self-pay

## 2017-07-08 ENCOUNTER — Ambulatory Visit (INDEPENDENT_AMBULATORY_CARE_PROVIDER_SITE_OTHER): Payer: 59 | Admitting: Physician Assistant

## 2017-07-08 DIAGNOSIS — M25562 Pain in left knee: Secondary | ICD-10-CM | POA: Diagnosis not present

## 2017-07-08 DIAGNOSIS — G8929 Other chronic pain: Secondary | ICD-10-CM

## 2017-07-08 NOTE — Progress Notes (Signed)
Miss Haggar returns today for follow-up of her left knee status post monovision skin injection 8:15 and 18. She states the injection was helpful. However she is still having pain in the knee mostly medial aspect of the knee. Feels as if the knee is going to buckle at times. She has cuff time with stairs. She has a painful pop in the medial aspect of the knee. States is no longer having to wear the hinged knee brace.  Left knee: Full extension and full flexion. Positive McMurray's. Tenderness along medial joint line. Crepitus with passive range of motion of the knee. Patellofemoral area.  Impression: Left knee pain  Plan: Due to the fact the patient has continued mechanical symptoms of the knee and pain despite conservative treatment recommend MRI. MRI is to evaluate for left knee meniscal tear and also evaluate the cartilage particularly the area where she had micro-fracture surgery lateral compartment of the knee. She'll follow up with [place] after the MRI to go over the results and discuss further treatment. Kircher continue to work on Dance movement psychotherapist in the interim.

## 2017-07-17 ENCOUNTER — Ambulatory Visit
Admission: RE | Admit: 2017-07-17 | Discharge: 2017-07-17 | Disposition: A | Discharge status: Home / Self Care | Payer: 59 | Source: Ambulatory Visit | Attending: Orthopaedic Surgery | Admitting: Orthopaedic Surgery

## 2017-07-17 DIAGNOSIS — G8929 Other chronic pain: Secondary | ICD-10-CM

## 2017-07-17 DIAGNOSIS — M25562 Pain in left knee: Principal | ICD-10-CM

## 2017-07-17 DIAGNOSIS — M25462 Effusion, left knee: Secondary | ICD-10-CM | POA: Diagnosis not present

## 2017-08-13 ENCOUNTER — Other Ambulatory Visit: Payer: Self-pay | Admitting: Endocrinology

## 2017-08-13 MED ORDER — INVOKANA 300 MG PO TABS: 300.0000 mg | ORAL_TABLET | Freq: Every day | ORAL | 11 refills | Status: DC

## 2017-08-27 MED FILL — REPAGLINIDE 0.5 MG TABLET: 0.5 | 90 days supply | Qty: 90 | Fill #1

## 2017-10-06 ENCOUNTER — Encounter: Payer: Self-pay | Admitting: Internal Medicine

## 2017-10-07 ENCOUNTER — Ambulatory Visit (INDEPENDENT_AMBULATORY_CARE_PROVIDER_SITE_OTHER): Payer: No Typology Code available for payment source | Admitting: Internal Medicine

## 2017-10-07 ENCOUNTER — Encounter: Payer: Self-pay | Admitting: Internal Medicine

## 2017-10-07 VITALS — BP 118/66 | HR 66 | Ht 63.0 in | Wt 211.0 lb

## 2017-10-07 DIAGNOSIS — I456 Pre-excitation syndrome: Secondary | ICD-10-CM | POA: Diagnosis not present

## 2017-10-07 DIAGNOSIS — I10 Essential (primary) hypertension: Secondary | ICD-10-CM | POA: Diagnosis not present

## 2017-10-07 MED ORDER — ROSUVASTATIN CALCIUM 5 MG PO TABS: 5.0000 mg | ORAL_TABLET | Freq: Every day | ORAL | 3 refills | Status: DC

## 2017-10-07 MED ORDER — NEBIVOLOL HCL 5 MG PO TABS: ORAL_TABLET | ORAL | 3 refills | Status: DC

## 2017-10-07 NOTE — Progress Notes (Signed)
HPI Carolyn Phillips returns today for followup of atypical chest pain and remote WPW syndrome, s/p ablation. The patient was seen by me over a year ago. At that time she had non-cardiac chest pain. She notes palpitations and after she experienced these, she got an Apple watch. Since then she has had none. She also notes some fatigue but no edema. No chest pain. No real sob. She is frustrated by her inability to lose weight. Allergies  Allergen Reactions  . Penicillins Hives and Swelling    Has patient had a PCN reaction causing immediate rash, facial/tongue/throat swelling, SOB or lightheadedness with hypotension: Yes Has patient had a PCN reaction causing severe rash involving mucus membranes or skin necrosis: No Has patient had a PCN reaction that required hospitalization No Has patient had a PCN reaction occurring within the last 10 years: No If all of the above answers are "NO", then may proceed with Cephalosporin use.   Eye swelling  . Metoprolol     "LOL" Drugs cause hair to fall out  . Nitrofurantoin Macrocrystal Other (See Comments)  . Other Nausea And Vomiting    General Anesthesia      Current Outpatient Medications  Medication Sig Dispense Refill  . Biotin 2500 MCG CAPS Take 2 capsules by mouth daily.    . Blood Glucose Monitoring Suppl (FREESTYLE LITE) DEVI Use to check blood sugar 1 time per day. Dx Code: E11.9 1 each 2  . cetirizine (ZYRTEC) 10 MG tablet Take 10 mg by mouth daily.    . Cholecalciferol (VITAMIN D3) 3000 UNITS TABS Take 5,000 Units by mouth daily.     . Cyanocobalamin (VITAMIN B 12 PO) Take 1 tablet by mouth daily.    . empagliflozin (JARDIANCE) 25 MG TABS tablet Take 25 mg by mouth daily. 30 tablet 11  . glucose blood (FREESTYLE LITE) test strip Use to check blood sugar 1 time per day. Dx Code: E11.9 100 each 2  . Lancets (FREESTYLE) lancets Use to check blood sugar 1 time per day. Dx Code: E11.9 100 each 2  . meclizine (ANTIVERT) 25 MG tablet  meclizine 25 mg tablet  TK 1 T  PO TID PRN FOR DIZZINESS    . methocarbamol (ROBAXIN) 500 MG tablet methocarbamol 500 mg tablet  TK 2 TS PO QID PRF SPASM    . Multiple Vitamins-Minerals (ZINC PO) Take 1 tablet by mouth daily.    . nebivolol (BYSTOLIC) 5 MG tablet Take 1 tablet (5 mg) daily as needed 90 tablet 3  . ondansetron (ZOFRAN-ODT) 4 MG disintegrating tablet ondansetron 4 mg disintegrating tablet  DIS ONE T PO  Q 8 H PRN FOR NAUSEA    . repaglinide (PRANDIN) 0.5 MG tablet Take 1 tablet (0.5 mg total) by mouth daily with supper. 90 tablet 3  . rosuvastatin (CRESTOR) 5 MG tablet Take 1 tablet (5 mg total) by mouth daily. 90 tablet 3  . VITAMIN A PO Take 1 tablet by mouth daily.     No current facility-administered medications for this visit.      Past Medical History:  Diagnosis Date  . Beta thalassemia trait   . Complication of anesthesia   . Diabetes mellitus without complication (HCC)   . Dysrhythmia    WPW-ablation  . GERD (gastroesophageal reflux disease)   . Headache   . Hyperlipemia   . Hypertension   . Obesity   . PONV (postoperative nausea and vomiting)   . Wolff-Parkinson-White syndrome  ROS:   All systems reviewed and negative except as noted in the HPI.   Past Surgical History:  Procedure Laterality Date  . ABDOMINAL HYSTERECTOMY    . BLADDER REPAIR    . BREAST REDUCTION SURGERY    . CHONDROPLASTY Left 09/13/2015   Procedure: CHONDROPLASTY;  Surgeon: Gean Birchwood, MD;  Location: [facility];  Service: Orthopedics;  Laterality: Left;  . KNEE ARTHROSCOPY WITH DRILLING/MICROFRACTURE Left 09/13/2015   Procedure: KNEE ARTHROSCOPY WITH DRILLING/MICROFRACTURE;  Surgeon: Gean Birchwood, MD;  Location: [facility];  Service: Orthopedics;  Laterality: Left;  . SUPRAVENTRICULAR TACHYCARDIA ABLATION N/A 07/04/2014   Procedure: WPW ABLATION;  Surgeon: Marinus Maw, MD;  Location: [facility] C[facility] LAB;  Service: Cardiovascular;  Laterality: N/A;    . TUBAL LIGATION       Family History  Problem Relation Age of Onset  . Valvular heart disease Mother        Aortic valve replacement  . Diabetes Mother   . Heart attack Maternal Grandmother        50s  . Stroke Maternal Grandmother   . Diabetes Sister   . Breast cancer Sister        Half Sister  . Diabetes Brother   . Breast cancer Paternal Aunt   . Breast cancer Cousin 40     Social History   Socioeconomic History  . Marital status: Legally Separated    Spouse name: Not on file  . Number of children: 2  . Years of education: College  . Highest education level: Not on file  Social Needs  . Financial resource strain: Not on file  . Food insecurity - worry: Not on file  . Food insecurity - inability: Not on file  . Transportation needs - medical: Not on file  . Transportation needs - non-medical: Not on file  Occupational History  . Occupation: works at [employer], student  Tobacco Use  . Smoking status: Never Smoker  . Smokeless tobacco: Never Used  . Tobacco comment: "only smoked for one year al ong time ago "  Substance and Sexual Activity  . Alcohol use: Yes    Alcohol/week: 0.0 oz    Comment: occasional  . Drug use: No  . Sexual activity: Not on file  Other Topics Concern  . Not on file  Social History Narrative   Pt lives with mother, children and husband.   Caffeine use: small coffee per day   1 mountain dew per day     BP 118/66   Pulse 66   Ht 5\' 3"  (1.6 m)   Wt 211 lb (95.7 kg)   BMI 37.38 kg/m   Physical Exam:  Well appearing middle aged woman, NAD HEENT: Unremarkable Neck:  6 cm JVD, no thyromegally Lymphatics:  No adenopathy Back:  No CVA tenderness Lungs:  Clear with no wheezes HEART:  Regular rate rhythm, no murmurs, no rubs, no clicks Abd:  soft, positive bowel sounds, no organomegally, no rebound, no guarding Ext:  2 plus pulses, no edema, no cyanosis, no clubbing Skin:  No rashes no nodules Neuro:  CN II through XII  intact, motor grossly intact  EKG - nsr   Assess/Plan: 1. WPW - no evidence of any recurrent AP conduction. 2. HTN - her blood pressure is well controlled today. I have asked that she try and lose weight. 3. Palpitations - the etiology is unclear. There is an increased incidence of atrial fib in patients with a h/o WPW.  She has started wearing an Apple watch and will make a copy of her ECG's for us if she has more palpitations so we can make a rhythm diagnosis 4. obeseity - she continues to gain weight. I have asked her to work on reducing her oral intake and increasing her energy level.  Leonia ReevesGregg Brach Birdsall,M.D.

## 2017-10-07 NOTE — Patient Instructions (Addendum)
Medication Instructions:  Your physician recommends that you continue on your current medications as directed. Please refer to the Current Medication list given to you today.  Labwork: None ordered.  Testing/Procedures: None ordered.  Follow-Up: Your physician wants you to follow-up in 8 to 10 weeks with Dr. Ladona Ridgelaylor.  Any Other Special Instructions Will Be Listed Below (If Applicable).  If you need a refill on your cardiac medications before your next appointment, please call your pharmacy.

## 2017-10-08 ENCOUNTER — Telehealth: Payer: Self-pay

## 2017-10-08 NOTE — Telephone Encounter (Signed)
**Note De-Identified  Obfuscation** I have done a PA on Bystolic through [employer]mymeds.

## 2017-10-09 NOTE — Telephone Encounter (Signed)
Received a fax from [employer] regarding prior authorization for patients BYSTOLIC, it states PA was done on wrong form, I completed form they faxed and faxed back to [employer].

## 2017-10-13 ENCOUNTER — Encounter (HOSPITAL_COMMUNITY): Payer: Self-pay | Admitting: Emergency Medicine

## 2017-10-13 ENCOUNTER — Other Ambulatory Visit: Payer: Self-pay

## 2017-10-13 ENCOUNTER — Ambulatory Visit (INDEPENDENT_AMBULATORY_CARE_PROVIDER_SITE_OTHER): Payer: No Typology Code available for payment source

## 2017-10-13 ENCOUNTER — Ambulatory Visit (HOSPITAL_COMMUNITY)
Admission: EM | Admit: 2017-10-13 | Discharge: 2017-10-13 | Disposition: A | Discharge status: Home / Self Care | Payer: No Typology Code available for payment source | Attending: Internal Medicine | Admitting: Internal Medicine

## 2017-10-13 DIAGNOSIS — J181 Lobar pneumonia, unspecified organism: Secondary | ICD-10-CM

## 2017-10-13 DIAGNOSIS — R05 Cough: Secondary | ICD-10-CM

## 2017-10-13 DIAGNOSIS — J189 Pneumonia, unspecified organism: Secondary | ICD-10-CM

## 2017-10-13 MED ORDER — AZITHROMYCIN 250 MG PO TABS: 250.0000 mg | ORAL_TABLET | Freq: Every day | ORAL | 0 refills | Status: DC

## 2017-10-13 MED ORDER — FLUTICASONE PROPIONATE 50 MCG/ACT NA SUSP: 2.0000 | Freq: Every day | NASAL | 0 refills | Status: DC

## 2017-10-13 MED FILL — FLUTICASONE PROP 50 MCG SPR: 50 | 30 days supply | Qty: 16 | Fill #0

## 2017-10-13 MED FILL — AZITHROMYCIN 250 MG TABLET: 250 | 5 days supply | Qty: 6 | Fill #0

## 2017-10-13 NOTE — Discharge Instructions (Signed)
Start azithromycin as directed. Start flonase, zyrtec for nasal congestion. You can use over the counter nasal saline rinse such as neti pot for nasal congestion. Keep hydrated, your urine should be clear to pale yellow in color. Tylenol/motrin for fever and pain. Monitor for any worsening of symptoms, chest pain, shortness of breath, wheezing, swelling of the throat, follow up for reevaluation.  Follow-up with PCP in 10 days for reevaluation.

## 2017-10-13 NOTE — ED Triage Notes (Signed)
Pt c/o fever and cough since Friday of 102.4

## 2017-10-13 NOTE — ED Provider Notes (Signed)
[facility]    CSN: [id number] Arrival date & time: 10/13/17  1515     History   Chief Complaint Chief Complaint  Patient presents with  . Fever  . Cough    HPI Carolyn Phillips is a 42 y.o. female.   42 year old female with history of DM, WPW, HLD, HTN, GERD, comes in for worsening URI symptoms.  States initial symptoms started 2 weeks ago, with sore throat, nasal congestion, rhinorrhea.  It improved after OTC medications.  However, started having fever, T-max of 102 3 days ago.  Started having productive cough with continued nasal congestion and rhinorrhea.  Body aches.  Denies current sore throat.  Restarted OTC medications with nasal spray without improvement.  She has felt short of breath without wheezing.  Denies chest pain.  Never smoker.      Past Medical History:  Diagnosis Date  . Beta thalassemia trait   . Complication of anesthesia   . Diabetes mellitus without complication (HCC)   . Dysrhythmia    WPW-ablation  . GERD (gastroesophageal reflux disease)   . Headache   . Hyperlipemia   . Hypertension   . Obesity   . PONV (postoperative nausea and vomiting)   . Wolff-Parkinson-White syndrome     Patient Active Problem List   Diagnosis Date Noted  . Left knee pain 07/08/2017  . Dysuria 10/24/2016  . Paresthesias 06/02/2016  . Diabetes mellitus without complication (HCC)   . Hyperlipemia   . Chest pain 01/11/2016  . Diabetes mellitus (HCC) 01/11/2016  . Dyslipidemia 01/11/2016  . Hypokalemia 01/11/2016  . Wolff-Parkinson-White syndrome 07/04/2014  . Wolff-Parkinson-White (WPW) syndrome 07/04/2014  . Hypertension 05/25/2014  . WPW (Wolff-Parkinson-White syndrome) 05/25/2014  . Sleep apnea 05/25/2014    Past Surgical History:  Procedure Laterality Date  . ABDOMINAL HYSTERECTOMY    . BLADDER REPAIR    . BREAST REDUCTION SURGERY    . CHONDROPLASTY Left 09/13/2015   Procedure: CHONDROPLASTY;  Surgeon: Gean Birchwood, MD;  Location:  [facility];  Service: Orthopedics;  Laterality: Left;  . KNEE ARTHROSCOPY WITH DRILLING/MICROFRACTURE Left 09/13/2015   Procedure: KNEE ARTHROSCOPY WITH DRILLING/MICROFRACTURE;  Surgeon: Gean Birchwood, MD;  Location: [facility];  Service: Orthopedics;  Laterality: Left;  . SUPRAVENTRICULAR TACHYCARDIA ABLATION N/A 07/04/2014   Procedure: WPW ABLATION;  Surgeon: Marinus Maw, MD;  Location[facility] CATH LAB;  Service: Cardiovascular;  Laterality: N/A;  . TUBAL LIGATION      OB History    No data available       Home Medications    Prior to Admission medications   Medication Sig Start Date End Date Taking? Authorizing Provider  azithromycin (ZITHROMAX) 250 MG tablet Take 1 tablet (250 mg total) by mouth daily. Take first 2 tablets together, then 1 every day until finished. 10/13/17   Belinda Fisher, PA-C  Biotin 2500 MCG CAPS Take 2 capsules by mouth daily.    [provider]  Blood Glucose Monitoring Suppl (FREESTYLE LITE) DEVI Use to check blood sugar 1 time per day. Dx Code: E11.9 11/01/16   Romero Belling, MD  cetirizine (ZYRTEC) 10 MG tablet Take 10 mg by mouth daily.    [provider]  Cholecalciferol (VITAMIN D3) 3000 UNITS TABS Take 5,000 Units by mouth daily.     [provider]  Cyanocobalamin (VITAMIN B 12 PO) Take 1 tablet by mouth daily.    [provider]  empagliflozin (JARDIANCE) 25 MG TABS tablet Take 25 mg  by mouth daily. 06/27/17   Romero BellingEllison, Sean, MD  fluticasone (FLONASE) 50 MCG/ACT nasal spray Place 2 sprays into both nostrils daily. 10/13/17   Asencion Loveday V, PA-C  glucose blood (FREESTYLE LITE) test strip Use to check blood sugar 1 time per day. Dx Code: E11.9 11/01/16   Romero BellingEllison, Sean, MD  Lancets (FREESTYLE) lancets Use to check blood sugar 1 time per day. Dx Code: E11.9 11/01/16   Romero BellingEllison, Sean, MD  meclizine (ANTIVERT) 25 MG tablet meclizine 25 mg tablet  TK 1 T  PO TID PRN FOR DIZZINESS    [provider]    methocarbamol (ROBAXIN) 500 MG tablet methocarbamol 500 mg tablet  TK 2 TS PO QID PRF SPASM    [provider]  Multiple Vitamins-Minerals (ZINC PO) Take 1 tablet by mouth daily.    [provider]  nebivolol (BYSTOLIC) 5 MG tablet Take 1 tablet (5 mg) daily as needed 10/07/17   Marinus Mawaylor, Gregg W, MD  ondansetron (ZOFRAN-ODT) 4 MG disintegrating tablet ondansetron 4 mg disintegrating tablet  DIS ONE T PO  Q 8 H PRN FOR NAUSEA    [provider]  repaglinide (PRANDIN) 0.5 MG tablet Take 1 tablet (0.5 mg total) by mouth daily with supper. 02/21/17   Romero BellingEllison, Sean, MD  rosuvastatin (CRESTOR) 5 MG tablet Take 1 tablet (5 mg total) by mouth daily. 10/07/17   Marinus Mawaylor, Gregg W, MD  VITAMIN A PO Take 1 tablet by mouth daily.    [provider]    Family History Family History  Problem Relation Age of Onset  . Valvular heart disease Mother        Aortic valve replacement  . Diabetes Mother   . Heart attack Maternal Grandmother        50s  . Stroke Maternal Grandmother   . Diabetes Sister   . Breast cancer Sister        Half Sister  . Diabetes Brother   . Breast cancer Paternal Aunt   . Breast cancer Cousin [id number]    Social History Social History   Tobacco Use  . Smoking status: Never Smoker  . Smokeless tobacco: Never Used  . Tobacco comment: "only smoked for one year al ong time ago "  Substance Use Topics  . Alcohol use: Yes    Alcohol/week: 0.0 oz    Comment: occasional  . Drug use: No     Allergies   Penicillins; Metoprolol; Nitrofurantoin macrocrystal; and Other   Review of Systems Review of Systems  Reason unable to perform ROS: See HPI as above.     Physical Exam Triage Vital Signs ED Triage Vitals [10/13/17 1529]  Enc Vitals Group     BP 128/78     Pulse Rate 80     Resp 18     Temp 98.6 F (37 C)     Temp Source Oral     SpO2 100 %     Weight      Height      Head Circumference      Peak Flow      Pain Score      Pain Loc       Pain Edu?      Excl. in GC?    No data found.  Updated Vital Signs BP 128/78   Pulse 80   Temp 98.6 F (37 C) (Oral)   Resp 18   SpO2 100%   Physical Exam  Constitutional: She is oriented to person,  place, and time. She appears well-developed and well-nourished. No distress.  HENT:  Head: Normocephalic and atraumatic.  Right Ear: External ear and ear canal normal. Tympanic membrane is erythematous. Tympanic membrane is not bulging.  Left Ear: External ear and ear canal normal. Tympanic membrane is erythematous. Tympanic membrane is not bulging.  Nose: Mucosal edema and rhinorrhea present. Right sinus exhibits no maxillary sinus tenderness and no frontal sinus tenderness. Left sinus exhibits no maxillary sinus tenderness and no frontal sinus tenderness.  Mouth/Throat: Uvula is midline, oropharynx is clear and moist and mucous membranes are normal.  Eyes: Conjunctivae are normal. Pupils are equal, round, and reactive to light.  Neck: Normal range of motion. Neck supple.  Cardiovascular: Normal rate, regular rhythm and normal heart sounds. Exam reveals no gallop and no friction rub.  No murmur heard. Pulmonary/Chest: Effort normal and breath sounds normal. She has no decreased breath sounds. She has no wheezes. She has no rhonchi. She has no rales.  Lymphadenopathy:    She has no cervical adenopathy.  Neurological: She is alert and oriented to person, place, and time.  Skin: Skin is warm and dry.  Psychiatric: She has a normal mood and affect. Her behavior is normal. Judgment normal.     UC Treatments / Results  Labs (all labs ordered are listed, but only abnormal results are displayed) Labs Reviewed - No data to display  Lab Results  Component Value Date   HGBA1C 6.6 06/27/2017     EKG  EKG Interpretation None       Radiology Dg Chest 2 View  Result Date: 10/13/2017 CLINICAL DATA:  5 day history of cough. EXAM: CHEST  2 VIEW COMPARISON:  01/10/2016 FINDINGS:  Posterior atelectasis seen on the lateral film with a infrahilar nodule that was not present on the prior study. No pleural effusion. No pulmonary edema. The cardiopericardial silhouette is within normal limits for size. The visualized bony structures of the thorax are intact. IMPRESSION: Subsegmental atelectasis on the lateral film is probably in the left base with a nodular opacity in the infrahilar region on the lateral film, new since prior study. This is indeterminate and likely superimposition of vascular anatomy, but follow-up CT chest without contrast recommended to confirm. Electronically Signed   By: Kennith Center M.D.   On: 10/13/2017 16:08    Procedures Procedures (including critical care time)  Medications Ordered in UC Medications - No data to display   Initial Impression / Assessment and Plan / UC Course  I have reviewed the triage vital signs and the nursing notes.  Pertinent labs & imaging results that were available during my care of the patient were reviewed by me and considered in my medical decision making (see chart for details).    Discussed x-ray results with patient.  Start azithromycin for pneumonia.  Other symptomatic treatment discussed.  Push fluids.  Patient to follow-up with PCP in 10 days for recheck.  Return precautions given.  Patient expresses understanding and agrees to plan.  Case discussed with Dr. Milus Glazier, who agrees to plan.  Final Clinical Impressions(s) / UC Diagnoses   Final diagnoses:  Pneumonia of left lower lobe due to infectious organism [facility])    ED Discharge Orders        Ordered    azithromycin (ZITHROMAX) 250 MG tablet  Daily     10/13/17 1637    fluticasone (FLONASE) 50 MCG/ACT nasal spray  Daily     10/13/17 1637  Belinda Fisher, PA-C 10/13/17 1700

## 2017-10-13 NOTE — Telephone Encounter (Signed)
**Note De-Identified Roshawn Lacina Obfuscation** Letter received Orazio Weller fax from [employer] stating that a PA is not required for this medication as it is covered on the pts plan. [facility] [address].

## 2017-10-15 MED FILL — BYSTOLIC 5 MG TABLET: 5 | 90 days supply | Qty: 90 | Fill #0

## 2017-10-17 ENCOUNTER — Other Ambulatory Visit: Payer: Self-pay

## 2017-10-17 ENCOUNTER — Encounter (HOSPITAL_COMMUNITY): Payer: Self-pay | Admitting: Emergency Medicine

## 2017-10-17 ENCOUNTER — Ambulatory Visit (INDEPENDENT_AMBULATORY_CARE_PROVIDER_SITE_OTHER): Payer: No Typology Code available for payment source

## 2017-10-17 ENCOUNTER — Ambulatory Visit (HOSPITAL_COMMUNITY)
Admission: EM | Admit: 2017-10-17 | Discharge: 2017-10-17 | Disposition: A | Discharge status: Home / Self Care | Payer: No Typology Code available for payment source | Attending: Emergency Medicine | Admitting: Emergency Medicine

## 2017-10-17 DIAGNOSIS — J9811 Atelectasis: Secondary | ICD-10-CM | POA: Diagnosis not present

## 2017-10-17 DIAGNOSIS — J189 Pneumonia, unspecified organism: Secondary | ICD-10-CM

## 2017-10-17 DIAGNOSIS — R509 Fever, unspecified: Secondary | ICD-10-CM

## 2017-10-17 MED ORDER — AEROCHAMBER PLUS MISC: 2 refills | Status: AC

## 2017-10-17 MED ORDER — ALBUTEROL SULFATE HFA 108 (90 BASE) MCG/ACT IN AERS: 2.0000 | INHALATION_SPRAY | RESPIRATORY_TRACT | 0 refills | Status: DC | PRN

## 2017-10-17 MED ORDER — LEVOFLOXACIN 750 MG PO TABS: 750.0000 mg | ORAL_TABLET | Freq: Every day | ORAL | 0 refills | Status: DC

## 2017-10-17 MED FILL — AEROCHAMBER: 10 days supply | Qty: 1 | Fill #0

## 2017-10-17 MED FILL — levoFLOXacin 750 MG TABS: 750 | 5 days supply | Qty: 5 | Fill #0

## 2017-10-17 MED FILL — VENTOLIN HFA 90 MCG INHALER: 108 (90 BAS | 25 days supply | Qty: 18 | Fill #0

## 2017-10-17 NOTE — ED Provider Notes (Signed)
HPI  SUBJECTIVE:  Carolyn Phillips is a 42 y.o. female who was sent in by PMD for repeat x-ray to evaluate for a possible pulmonary nodule.  She is on day #5 of azithromycin for presumed pneumonia and states that she feels better overall, but reports persistent fevers T-max 101.4.  She reports coughing, shortness of breath after coughing and dyspnea on exertion.  She has also tried sleeping with a humidifier, Flonase, ibuprofen and Tylenol.  No alleviating factors.  No aggravating factors.  No wheezing, sinus pain or pressure, ear pain, sore throat, abdominal pain, urinary complaints.  No postnasal drip.  No headache, neck stiffness, photophobia.  She states that she has seen her PMD in the interim and she has tested negative for flu and strep.  She was told that her sinuses were significantly inflamed  Seen here 4 days ago for fever.  Chest x-ray showed subsegmental atelectasis probably in the left base with a nodular opacity in the infrahilar region which was new.  She was started on azithromycin for a PNA.  She was sent in today by her PMD for repeat chest x-ray on the same equipment to confirm this.  Past medical history of diabetes, WPW, hypertension, GERD.  Family history of father with non-small cell carcinoma of the lung.  PCP: Alysia Penna, MD   Past Medical History:  Diagnosis Date  . Beta thalassemia trait   . Complication of anesthesia   . Diabetes mellitus without complication (HCC)   . Dysrhythmia    WPW-ablation  . GERD (gastroesophageal reflux disease)   . Headache   . Hyperlipemia   . Hypertension   . Obesity   . PONV (postoperative nausea and vomiting)   . Wolff-Parkinson-White syndrome     Past Surgical History:  Procedure Laterality Date  . ABDOMINAL HYSTERECTOMY    . BLADDER REPAIR    . BREAST REDUCTION SURGERY    . CHONDROPLASTY Left 09/13/2015   Procedure: CHONDROPLASTY;  Surgeon: Gean Birchwood, MD;  Location: [facility];  Service:  Orthopedics;  Laterality: Left;  . KNEE ARTHROSCOPY WITH DRILLING/MICROFRACTURE Left 09/13/2015   Procedure: KNEE ARTHROSCOPY WITH DRILLING/MICROFRACTURE;  Surgeon: Gean Birchwood, MD;  Location: [facility];  Service: Orthopedics;  Laterality: Left;  . SUPRAVENTRICULAR TACHYCARDIA ABLATION N/A 07/04/2014   Procedure: WPW ABLATION;  Surgeon: Marinus Maw, MD;  Location: [facility] CATH LAB;  Service: Cardiovascular;  Laterality: N/A;  . TUBAL LIGATION      Family History  Problem Relation Age of Onset  . Valvular heart disease Mother        Aortic valve replacement  . Diabetes Mother   . Heart attack Maternal Grandmother        50s  . Stroke Maternal Grandmother   . Diabetes Sister   . Breast cancer Sister        Half Sister  . Diabetes Brother   . Breast cancer Paternal Aunt   . Breast cancer Cousin 7    Social History   Tobacco Use  . Smoking status: Never Smoker  . Smokeless tobacco: Never Used  . Tobacco comment: "only smoked for one year al ong time ago "  Substance Use Topics  . Alcohol use: Yes    Alcohol/week: 0.0 oz    Comment: occasional  . Drug use: No    No current facility-administered medications for this encounter.   Current Outpatient Medications:  .  azithromycin (ZITHROMAX) 250 MG tablet, Take 1 tablet (250 mg total) by  mouth daily. Take first 2 tablets together, then 1 every day until finished., Disp: 6 tablet, Rfl: 0 .  Biotin 2500 MCG CAPS, Take 2 capsules by mouth daily., Disp: , Rfl:  .  Blood Glucose Monitoring Suppl (FREESTYLE LITE) DEVI, Use to check blood sugar 1 time per day. Dx Code: E11.9, Disp: 1 each, Rfl: 2 .  cetirizine (ZYRTEC) 10 MG tablet, Take 10 mg by mouth daily., Disp: , Rfl:  .  Cholecalciferol (VITAMIN D3) 3000 UNITS TABS, Take 5,000 Units by mouth daily. , Disp: , Rfl:  .  Cyanocobalamin (VITAMIN B 12 PO), Take 1 tablet by mouth daily., Disp: , Rfl:  .  empagliflozin (JARDIANCE) 25 MG TABS tablet, Take 25 mg by mouth  daily., Disp: 30 tablet, Rfl: 11 .  fluticasone (FLONASE) 50 MCG/ACT nasal spray, Place 2 sprays into both nostrils daily., Disp: 1 g, Rfl: 0 .  glucose blood (FREESTYLE LITE) test strip, Use to check blood sugar 1 time per day. Dx Code: E11.9, Disp: 100 each, Rfl: 2 .  Lancets (FREESTYLE) lancets, Use to check blood sugar 1 time per day. Dx Code: E11.9, Disp: 100 each, Rfl: 2 .  meclizine (ANTIVERT) 25 MG tablet, meclizine 25 mg tablet  TK 1 T  PO TID PRN FOR DIZZINESS, Disp: , Rfl:  .  methocarbamol (ROBAXIN) 500 MG tablet, methocarbamol 500 mg tablet  TK 2 TS PO QID PRF SPASM, Disp: , Rfl:  .  Multiple Vitamins-Minerals (ZINC PO), Take 1 tablet by mouth daily., Disp: , Rfl:  .  nebivolol (BYSTOLIC) 5 MG tablet, Take 1 tablet (5 mg) daily as needed, Disp: 90 tablet, Rfl: 3 .  ondansetron (ZOFRAN-ODT) 4 MG disintegrating tablet, ondansetron 4 mg disintegrating tablet  DIS ONE T PO  Q 8 H PRN FOR NAUSEA, Disp: , Rfl:  .  repaglinide (PRANDIN) 0.5 MG tablet, Take 1 tablet (0.5 mg total) by mouth daily with supper., Disp: 90 tablet, Rfl: 3 .  rosuvastatin (CRESTOR) 5 MG tablet, Take 1 tablet (5 mg total) by mouth daily., Disp: 90 tablet, Rfl: 3 .  VITAMIN A PO, Take 1 tablet by mouth daily., Disp: , Rfl:   Allergies  Allergen Reactions  . Penicillins Hives and Swelling    Has patient had a PCN reaction causing immediate rash, facial/tongue/throat swelling, SOB or lightheadedness with hypotension: Yes Has patient had a PCN reaction causing severe rash involving mucus membranes or skin necrosis: No Has patient had a PCN reaction that required hospitalization No Has patient had a PCN reaction occurring within the last 10 years: No If all of the above answers are "NO", then may proceed with Cephalosporin use.   Eye swelling  . Metoprolol     "LOL" Drugs cause hair to fall out  . Nitrofurantoin Macrocrystal Other (See Comments)  . Other Nausea And Vomiting    General Anesthesia       ROS  As noted in HPI.   Physical Exam  BP 126/74 (BP Location: Left Arm)   Pulse 73   Temp 98.3 F (36.8 C) (Oral)   Resp 16   SpO2 100%   Constitutional: Well developed, well nourished, no acute distress Eyes:  EOMI, conjunctiva normal bilaterally HENT: Normocephalic, atraumatic,mucus membranes moist TMs normal bilaterally.  Mild nasal congestion.  Erythematous, swollen turbinates.  No sinus tenderness.  Normal oropharynx.  No obvious postnasal drip. Respiratory: Normal inspiratory effort, lungs clear bilaterally. Cardiovascular: Normal rate and rhythm, no murmurs, rubs, gallops Neck: No cervical lymphadenopathy, meningismus  GI: nondistended soft, nontender, active bowel sounds, no guarding or rebound no suprapubic tenderness Back no CVA tenderness skin: No rash, skin intact Musculoskeletal: no deformities Neurologic: Alert & oriented x 3, no focal neuro deficits Psychiatric: Speech and behavior appropriate   ED Course   Medications - No data to display  Orders Placed This Encounter  Procedures  . DG Chest 2 View    Standing Status:   Standing    Number of Occurrences:   1    Order Specific Question:   Reason for Exam (SYMPTOM  OR DIAGNOSIS REQUIRED)    Answer:   f/u previous CXR for improvement    No results found for this or any previous visit (from the past 24 hour(s)). Dg Chest 2 View  Result Date: 10/17/2017 CLINICAL DATA:  Fever EXAM: CHEST  2 VIEW COMPARISON:  October 13, 2017 FINDINGS: There remains mild posterior left base atelectasis. There is no edema or consolidation. Heart size and pulmonary vascularity are normal. No adenopathy. No bone lesions. IMPRESSION: Stable posterior left base atelectasis. No new opacity. Stable cardiac silhouette. Electronically Signed   By: Bretta Bang III M.D.   On: 10/17/2017 10:17    ED Clinical Impression  No diagnosis found.   ED Assessment/Plan  Previous records reviewed as noted in HPI.  Reviewed  imaging independently and d/w rads.  Stable posterior left base atelectasis. No nodule.  No new opacity.  See radiology report for full details.  Suspect that the continued fevers are coming from atelectasis however it could also be pneumonia that is not completely responding to the azithromycin.  Could also be a sinus infection.  It does not sound like it is a UTI, meningitis, intra-abdominal process.  Will send home with an albuterol inhaler with a spacer, and 5 days of Levaquin 750 mg p.o. she is allergic to penicillins.  There is no other alternative other than another quinolones to treat this.  Patient was given 2 copies of her x-rays.  She will follow-up with her primary care physician in several days, she will go to the ER if she gets worse  Discussed  imaging, MDM, plan and followup with patient. Discussed sn/sx that should prompt return to the ED. patient agrees with plan.   No orders of the defined types were placed in this encounter.   *This clinic note was created using Dragon dictation software. Therefore, there may be occasional mistakes despite careful proofreading.   ?   Domenick Gong, MD 10/18/17 [phone]

## 2017-10-17 NOTE — Discharge Instructions (Signed)
2 puffs from your albuterol inhaler every 4-6 hours as needed for coughing, wheezing, shortness of breath.  Start the Levaquin tomorrow.  Continue Tylenol, ibuprofen, Flonase.  Follow-up with your doctor in several days.  Go to the ER if you get worse, if you pass out, if you start having chest pain or irregular heartbeat, or for other concerns.

## 2017-10-17 NOTE — ED Triage Notes (Signed)
Pt here for repeat xray, ongoing fevers for several weeks.

## 2017-10-19 ENCOUNTER — Emergency Department (HOSPITAL_COMMUNITY)
Admission: EM | Admit: 2017-10-19 | Discharge: 2017-10-19 | Disposition: A | Discharge status: Home / Self Care | Payer: No Typology Code available for payment source | Attending: Emergency Medicine | Admitting: Emergency Medicine

## 2017-10-19 ENCOUNTER — Encounter (HOSPITAL_COMMUNITY): Payer: Self-pay | Admitting: *Deleted

## 2017-10-19 ENCOUNTER — Other Ambulatory Visit: Payer: Self-pay

## 2017-10-19 DIAGNOSIS — J189 Pneumonia, unspecified organism: Secondary | ICD-10-CM | POA: Diagnosis not present

## 2017-10-19 DIAGNOSIS — M7918 Myalgia, other site: Secondary | ICD-10-CM

## 2017-10-19 DIAGNOSIS — Z7984 Long term (current) use of oral hypoglycemic drugs: Secondary | ICD-10-CM | POA: Diagnosis not present

## 2017-10-19 DIAGNOSIS — R0789 Other chest pain: Secondary | ICD-10-CM | POA: Diagnosis not present

## 2017-10-19 DIAGNOSIS — I10 Essential (primary) hypertension: Secondary | ICD-10-CM | POA: Insufficient documentation

## 2017-10-19 DIAGNOSIS — Z79899 Other long term (current) drug therapy: Secondary | ICD-10-CM | POA: Diagnosis not present

## 2017-10-19 DIAGNOSIS — E119 Type 2 diabetes mellitus without complications: Secondary | ICD-10-CM | POA: Insufficient documentation

## 2017-10-19 DIAGNOSIS — R079 Chest pain, unspecified: Secondary | ICD-10-CM | POA: Diagnosis present

## 2017-10-19 DIAGNOSIS — Z96652 Presence of left artificial knee joint: Secondary | ICD-10-CM | POA: Diagnosis not present

## 2017-10-19 LAB — I-STAT TROPONIN, ED
Troponin i, poc: 0 ng/mL (ref 0.00–0.08)
Troponin i, poc: 0 ng/mL (ref 0.00–0.08)

## 2017-10-19 LAB — BASIC METABOLIC PANEL
Anion gap: 12 (ref 5–15)
BUN: 14 mg/dL (ref 6–20)
CO2: 23 mmol/L (ref 22–32)
Calcium: 9.5 mg/dL (ref 8.9–10.3)
Chloride: 103 mmol/L (ref 101–111)
Creatinine, Ser: 1.06 mg/dL — ABNORMAL HIGH (ref 0.44–1.00)
GFR calc Af Amer: >60 mL/min (ref >60)
GLUCOSE: 107 mg/dL — AB (ref 65–99)
POTASSIUM: 3.6 mmol/L (ref 3.5–5.1)
Sodium: 138 mmol/L (ref 135–145)

## 2017-10-19 LAB — CBC
HEMATOCRIT: 40.3 % (ref 36.0–46.0)
Hemoglobin: 13 g/dL (ref 12.0–15.0)
MCH: 23.8 pg — AB (ref 26.0–34.0)
MCHC: 32.3 g/dL (ref 30.0–36.0)
MCV: 73.7 fL — AB (ref 78.0–100.0)
Platelets: 345 10*3/uL (ref 150–400)
RBC: 5.47 MIL/uL — ABNORMAL HIGH (ref 3.87–5.11)
RDW: 15.4 % (ref 11.5–15.5)
WBC: 10 10*3/uL (ref 4.0–10.5)

## 2017-10-19 MED ORDER — CYCLOBENZAPRINE HCL 10 MG PO TABS: 10.0000 mg | ORAL_TABLET | Freq: Two times a day (BID) | ORAL | 0 refills | Status: DC | PRN

## 2017-10-19 NOTE — ED Triage Notes (Signed)
Pt c/o L chest and L back pain since being diagnosed with pneumonia on Monday. Pt took azithromycin for 5 days, still been running a low grade fever, PCP started pt on levaquin today. Pt reports feeling like her heart was racing after taking the antibiotic. Reports increased pain to L side with difficulty getting comfortable

## 2017-10-19 NOTE — ED Notes (Signed)
Pt had a chest xray on Friday, results are in chart review

## 2017-10-19 NOTE — ED Notes (Signed)
[facility] had [facility] xray and was dx. With pneumonia started on erythromycin, finished her medication on Fri, [facility] had repeat cxr and was started on Levoquin. States this am started having having back pain c/o cough.

## 2017-10-19 NOTE — ED Notes (Signed)
This tech updated patient on wait time.

## 2017-10-19 NOTE — ED Provider Notes (Signed)
[facility] EMERGENCY DEPARTMENT Provider Note   CSN: [id number] Arrival date & time: 10/19/17  0110     History   Chief Complaint Chief Complaint  Patient presents with  . Chest Pain    HPI Carolyn Phillips is a 42 y.o. female.  HPI   42 year old female with a history of diabetes, WPW post ablation, hypertension, hyperlipidemia, recent diagnosis of pneumonia who completed azithromycin, started on Levaquin, who presents with concern for palpitations, chest and back pain.  Reports she believes palpitations are secondary to her albuterol.  Reports that her maximum temperature was about a week ago, and when she is initially diagnosed and started since then, she had a temperature of 100.5 on Wednesday.  Denies any recent temperatures over the last couple of days.  She just started taking Levaquin yesterday as prescribed at the urgent care on Friday.  She has not had further fevers.  Reports she came in because last night she began to have chest pain and back pain.  Describes as an achiness in her back that would come and go.  It was improved by her husband being on her back, and massage.  Denies any pain at this time.  Also reports she had some aching in her chest, near the sternal border.  It does not have significant shortness of breath.  No nausea vomiting.  Does report persistent cough.  Reports she believes albuterol started palpitations, however did help loosen up her cough, which has been productive of white sputum.  Past Medical History:  Diagnosis Date  . Beta thalassemia trait   . Complication of anesthesia   . Diabetes mellitus without complication (HCC)   . Dysrhythmia    WPW-ablation  . GERD (gastroesophageal reflux disease)   . Headache   . Hyperlipemia   . Hypertension   . Obesity   . PONV (postoperative nausea and vomiting)   . Wolff-Parkinson-White syndrome     Patient Active Problem List   Diagnosis Date Noted  . Left knee pain 07/08/2017   . Dysuria 10/24/2016  . Paresthesias 06/02/2016  . Diabetes mellitus without complication (HCC)   . Hyperlipemia   . Chest pain 01/11/2016  . Diabetes mellitus (HCC) 01/11/2016  . Dyslipidemia 01/11/2016  . Hypokalemia 01/11/2016  . Wolff-Parkinson-White syndrome 07/04/2014  . Wolff-Parkinson-White (WPW) syndrome 07/04/2014  . Hypertension 05/25/2014  . WPW (Wolff-Parkinson-White syndrome) 05/25/2014  . Sleep apnea 05/25/2014    Past Surgical History:  Procedure Laterality Date  . ABDOMINAL HYSTERECTOMY    . BLADDER REPAIR    . BREAST REDUCTION SURGERY    . CHONDROPLASTY Left 09/13/2015   Procedure: CHONDROPLASTY;  Surgeon: Gean Birchwood, MD;  Location: [facility];  Service: Orthopedics;  Laterality: Left;  . KNEE ARTHROSCOPY WITH DRILLING/MICROFRACTURE Left 09/13/2015   Procedure: KNEE ARTHROSCOPY WITH DRILLING/MICROFRACTURE;  Surgeon: Gean Birchwood, MD;  Location: [facility];  Service: Orthopedics;  Laterality: Left;  . SUPRAVENTRICULAR TACHYCARDIA ABLATION N/A 07/04/2014   Procedure: WPW ABLATION;  Surgeon: Marinus Maw, MD;  Location: [facility] CATH LAB;  Service: Cardiovascular;  Laterality: N/A;  . TUBAL LIGATION      OB History    No data available       Home Medications    Prior to Admission medications   Medication Sig Start Date End Date Taking? Authorizing Provider  acetaminophen (TYLENOL) 500 MG tablet Take 1,000 mg by mouth every 6 (six) hours as needed for mild pain.   Yes [provider]  albuterol (PROVENTIL HFA;VENTOLIN HFA) 108 (90 Base) MCG/ACT inhaler Inhale 2 puffs into the lungs every 4 (four) hours as needed for wheezing or shortness of breath. Dispense with aerochamber 10/17/17  Yes Domenick GongMortenson, Ashley, MD  Biotin 2500 MCG CAPS Take 2,500 mcg by mouth daily.    Yes [provider]  cetirizine (ZYRTEC) 10 MG tablet Take 10 mg by mouth daily as needed for allergies.    Yes [provider]  Cholecalciferol  (VITAMIN D3) 3000 UNITS TABS Take 5,000 Units by mouth daily.    Yes [provider]  Cyanocobalamin (VITAMIN B 12 PO) Take 1 tablet by mouth daily.   Yes [provider]  empagliflozin (JARDIANCE) 25 MG TABS tablet Take 25 mg by mouth daily. 06/27/17  Yes Romero BellingEllison, Sean, MD  fluticasone [facility](FLONASE) 50 MCG/ACT nasal spray Place 2 sprays into both nostrils daily. 10/13/17  Yes Yu, Amy V, PA-C  ibuprofen (ADVIL,MOTRIN) 200 MG tablet Take 200 mg by mouth every 6 (six) hours as needed for mild pain.   Yes [provider]  levofloxacin (LEVAQUIN) 750 MG tablet Take 1 tablet (750 mg total) by mouth daily. X 5 days 10/17/17  Yes Domenick GongMortenson, Ashley, MD  meclizine (ANTIVERT) 25 MG tablet TAKE 1 TABLET ( 25MG ) BY MOUTH THREE TIME DAILY AS NEEDED FOR DIZZINESS   Yes [provider]  methocarbamol (ROBAXIN) 500 MG tablet TAKE 1 TABLET (500MG ) BY MOUTH THREE TIME DAILY AS NEEDED FOR MUSCLE SPASM   Yes [provider]  Multiple Vitamins-Minerals (ZINC PO) Take 1 tablet by mouth daily.   Yes [provider]  nebivolol (BYSTOLIC) 5 MG tablet Take 1 tablet (5 mg) daily as needed Patient taking differently: 5 mg daily.  10/07/17  Yes Marinus Mawaylor, Gregg W, MD  repaglinide (PRANDIN) 0.5 MG tablet Take 1 tablet (0.5 mg total) by mouth daily with supper. 02/21/17  Yes Romero BellingEllison, Sean, MD  rosuvastatin (CRESTOR) 5 MG tablet Take 1 tablet (5 mg total) by mouth daily. 10/07/17  Yes Marinus Mawaylor, Gregg W, MD  VITAMIN A PO Take 1 tablet by mouth daily.   Yes [provider]  Blood Glucose Monitoring Suppl (FREESTYLE LITE) DEVI Use to check blood sugar 1 time per day. Dx Code: E11.9 11/01/16   Romero BellingEllison, Sean, MD  cyclobenzaprine (FLEXERIL) 10 MG tablet Take 1 tablet (10 mg total) by mouth 2 (two) times daily as needed for muscle spasms. 10/19/17   Alvira MondaySchlossman, Uzma Hellmer, MD  glucose blood (FREESTYLE LITE) test strip Use to check blood sugar 1 time per day. Dx Code: E11.9 11/01/16   Romero BellingEllison, Sean, MD    Lancets (FREESTYLE) lancets Use to check blood sugar 1 time per day. Dx Code: E11.9 11/01/16   Romero BellingEllison, Sean, MD  Spacer/Aero-Holding Chambers (AEROCHAMBER PLUS) inhaler Use as instructed 10/17/17   Domenick GongMortenson, Ashley, MD    Family History Family History  Problem Relation Age of Onset  . Valvular heart disease Mother        Aortic valve replacement  . Diabetes Mother   . Heart attack Maternal Grandmother        50s  . Stroke Maternal Grandmother   . Diabetes Sister   . Breast cancer Sister        Half Sister  . Diabetes Brother   . Breast cancer Paternal Aunt   . Breast cancer Cousin [phone]    Social History Social History   Tobacco Use  . Smoking status: Never Smoker  . Smokeless tobacco: Never Used  . Tobacco  comment: "only smoked for one year al ong time ago "  Substance Use Topics  . Alcohol use: Yes    Alcohol/week: 0.0 oz    Comment: occasional  . Drug use: No     Allergies   Penicillins; Metoprolol; Nitrofurantoin macrocrystal; and Other   Review of Systems Review of Systems  Constitutional: Negative for fever (last temp was 100.5 Wednesday, low grade since then 98 per pt).  HENT: Positive for congestion and rhinorrhea. Negative for sore throat.   Respiratory: Positive for cough. Negative for shortness of breath.   Cardiovascular: Positive for chest pain.  Gastrointestinal: Negative for abdominal pain, nausea and vomiting.  Musculoskeletal: Positive for back pain. Negative for neck pain.  Skin: Negative for rash.  Neurological: Negative for syncope and headaches.     Physical Exam Updated Vital Signs BP 106/69   Pulse 68   Temp 98.4 F (36.9 C) (Oral)   Resp 15   SpO2 97%   Physical Exam  Constitutional: She is oriented to person, place, and time. She appears well-developed and well-nourished. No distress.  HENT:  Head: Normocephalic and atraumatic.  Eyes: Conjunctivae and EOM are normal.  Neck: Normal range of motion.  Cardiovascular: Normal rate,  regular rhythm, normal heart sounds and intact distal pulses. Exam reveals no gallop and no friction rub.  No murmur heard. Tenderness chest wall Also tenderness to thoracic paraspinal msucles  Pulmonary/Chest: Effort normal and breath sounds normal. No respiratory distress. She has no wheezes. She has no rales.  Abdominal: She exhibits no distension. There is no guarding.  Musculoskeletal: She exhibits no edema or tenderness.  Neurological: She is alert and oriented to person, place, and time.  Skin: Skin is warm and dry. No rash noted. She is not diaphoretic. No erythema.  Nursing note and vitals reviewed.    ED Treatments / Results  Labs (all labs ordered are listed, but only abnormal results are displayed) Labs Reviewed  BASIC METABOLIC PANEL - Abnormal; Notable for the following components:      Result Value   Glucose, Bld 107 (*)    Creatinine, Ser 1.06 (*)    All other components within normal limits  CBC - Abnormal; Notable for the following components:   RBC 5.47 (*)    MCV 73.7 (*)    MCH 23.8 (*)    All other components within normal limits  I-STAT TROPONIN, ED  I-STAT TROPONIN, ED    EKG  EKG Interpretation  Date/Time:  Sunday October 19 2017 01:29:40 EST Ventricular Rate:  73 PR Interval:  182 QRS Duration: 84 QT Interval:  386 QTC Calculation: 425 R Axis:   78 Text Interpretation:  Normal sinus rhythm Normal ECG Similar inferior TW inversions to prior Artifact No significant change since last tracing Confirmed by ,  ([id number]) on 10/19/2017 9:14:05 AM       Radiology No results found.  Procedures Procedures (including critical care time)  Medications Ordered in ED Medications - No data to display   Initial Impression / Assessment and Plan / ED Course  I have reviewed the triage vital signs and the nursing notes.  Pertinent labs & imaging results that were available during my care of the patient were reviewed by me and considered in my  medical decision making (see chart for details).     41  year old female with a history of diabetes, WPW post ablation, hypertension, hyperlipidemia, recent diagnosis of pneumonia who completed azithromycin, started on Levaquin, who presents with concern for palpitations,  chest and back pain.  Palpitations likely secondary to albuterol.  EKG shows no significant changes in comparison to prior.  Patient is PERC negative, and in the setting of recent URI cough, pneumonia symptoms, low suspicion for pulmonary embolus.  She has good pulses bilaterally in the upper and lower extremities, intermittent aching, have low suspicion this represents dissection.  She has clear breath sounds bilaterally, have low suspicion for pneumothorax, patient had recent x-rays, and like to avoid further radiation.  Troponins negative x2, and have low suspicion for ACS.  Suspect patient with resolving pneumonia given no continuing leukocytosis, no fevers for last several days, however suspect she does have some persisting cough secondary to this, and musculoskeletal chest and back pain secondary to severe cough.  Will prescribe Flexeril, recommend close follow-up with primary care physician.  Final Clinical Impressions(s) / ED Diagnoses   Final diagnoses:  Community acquired pneumonia of left lung, unspecified part of lung  Musculoskeletal pain    ED Discharge Orders        Ordered    cyclobenzaprine (FLEXERIL) 10 MG tablet  2 times daily PRN     10/19/17 1017       Alvira Monday, MD 10/19/17 1759

## 2017-10-29 MED FILL — METHOCARBAMOL 500 MG TABS: 500 | 15 days supply | Qty: 45 | Fill #0

## 2017-10-29 MED FILL — IBUPROFEN 600 MG TABLET: 600 | 15 days supply | Qty: 45 | Fill #0

## 2017-11-03 ENCOUNTER — Other Ambulatory Visit: Payer: Self-pay | Admitting: Internal Medicine

## 2017-11-03 DIAGNOSIS — R9389 Abnormal findings on diagnostic imaging of other specified body structures: Secondary | ICD-10-CM

## 2017-11-03 DIAGNOSIS — R911 Solitary pulmonary nodule: Secondary | ICD-10-CM

## 2017-11-13 ENCOUNTER — Ambulatory Visit
Admission: RE | Admit: 2017-11-13 | Discharge: 2017-11-13 | Disposition: A | Discharge status: Home / Self Care | Payer: No Typology Code available for payment source | Source: Ambulatory Visit | Attending: Internal Medicine | Admitting: Internal Medicine

## 2017-11-13 DIAGNOSIS — R911 Solitary pulmonary nodule: Secondary | ICD-10-CM

## 2017-11-13 DIAGNOSIS — R9389 Abnormal findings on diagnostic imaging of other specified body structures: Secondary | ICD-10-CM

## 2017-11-24 MED FILL — ROSUVASTATIN CALCIUM 5 MG T: 5 | 30 days supply | Qty: 30 | Fill #0

## 2017-12-02 ENCOUNTER — Ambulatory Visit: Payer: No Typology Code available for payment source | Admitting: Internal Medicine

## 2017-12-02 ENCOUNTER — Encounter: Payer: Self-pay | Admitting: Internal Medicine

## 2017-12-02 VITALS — BP 110/78 | HR 73 | Ht 63.0 in | Wt 211.2 lb

## 2017-12-02 DIAGNOSIS — I10 Essential (primary) hypertension: Secondary | ICD-10-CM

## 2017-12-02 DIAGNOSIS — I456 Pre-excitation syndrome: Secondary | ICD-10-CM | POA: Diagnosis not present

## 2017-12-02 DIAGNOSIS — R002 Palpitations: Secondary | ICD-10-CM

## 2017-12-02 NOTE — Patient Instructions (Addendum)

## 2017-12-02 NOTE — Progress Notes (Signed)
HPI Ms. Nabozny returns today for followup. She is a pleasant 42 yo woman with a remote h/o WPW s/p ablation. She developed palpitations and was seen by me and started monitoring her heart with her apple watch. She has had documented PVC's. No chest pain or sob. She has been treated for pneumonia. No syncope. Allergies  Allergen Reactions  . Penicillins Hives and Swelling    Has patient had a PCN reaction causing immediate rash, facial/tongue/throat swelling, SOB or lightheadedness with hypotension: Yes Has patient had a PCN reaction causing severe rash involving mucus membranes or skin necrosis: No Has patient had a PCN reaction that required hospitalization No Has patient had a PCN reaction occurring within the last 10 years: No If all of the above answers are "NO", then may proceed with Cephalosporin use.   Eye swelling  . Metoprolol     "LOL" Drugs cause hair to fall out  . Nitrofurantoin Macrocrystal Other (See Comments)  . Other Nausea And Vomiting    General Anesthesia      Current Outpatient Medications  Medication Sig Dispense Refill  . acetaminophen (TYLENOL) 500 MG tablet Take 1,000 mg by mouth every 6 (six) hours as needed for mild pain.    Marland Kitchen albuterol (PROVENTIL HFA;VENTOLIN HFA) 108 (90 Base) MCG/ACT inhaler Inhale 2 puffs into the lungs every 4 (four) hours as needed for wheezing or shortness of breath. Dispense with aerochamber 1 Inhaler 0  . Biotin 2500 MCG CAPS Take 2,500 mcg by mouth daily.     . Blood Glucose Monitoring Suppl (FREESTYLE LITE) DEVI Use to check blood sugar 1 time per day. Dx Code: E11.9 1 each 2  . cetirizine (ZYRTEC) 10 MG tablet Take 10 mg by mouth daily as needed for allergies.     . Cholecalciferol (VITAMIN D3) 3000 UNITS TABS Take 5,000 Units by mouth daily.     . Cyanocobalamin (VITAMIN B 12 PO) Take 1 tablet by mouth daily.    . cyclobenzaprine (FLEXERIL) 10 MG tablet Take 1 tablet (10 mg total) by mouth 2 (two) times daily as needed  for muscle spasms. 20 tablet 0  . empagliflozin (JARDIANCE) 25 MG TABS tablet Take 25 mg by mouth daily. 30 tablet 11  . fluticasone (FLONASE) 50 MCG/ACT nasal spray Place 2 sprays into both nostrils daily. 1 g 0  . glucose blood (FREESTYLE LITE) test strip Use to check blood sugar 1 time per day. Dx Code: E11.9 100 each 2  . ibuprofen (ADVIL,MOTRIN) 200 MG tablet Take 200 mg by mouth every 6 (six) hours as needed for mild pain.    . Lancets (FREESTYLE) lancets Use to check blood sugar 1 time per day. Dx Code: E11.9 100 each 2  . meclizine (ANTIVERT) 25 MG tablet TAKE 1 TABLET ( 25MG ) BY MOUTH THREE TIME DAILY AS NEEDED FOR DIZZINESS    . methocarbamol (ROBAXIN) 500 MG tablet TAKE 1 TABLET (500MG ) BY MOUTH THREE TIME DAILY AS NEEDED FOR MUSCLE SPASM    . Multiple Vitamins-Minerals (ZINC PO) Take 1 tablet by mouth daily.    . nebivolol (BYSTOLIC) 5 MG tablet Take 1 tablet (5 mg) daily as needed (Patient taking differently: 5 mg daily. ) 90 tablet 3  . repaglinide (PRANDIN) 0.5 MG tablet Take 1 tablet (0.5 mg total) by mouth daily with supper. 90 tablet 3  . rosuvastatin (CRESTOR) 5 MG tablet Take 1 tablet (5 mg total) by mouth daily. 90 tablet 3  . Spacer/Aero-Holding Chambers (AEROCHAMBER  PLUS) inhaler Use as instructed 1 each 2  . VITAMIN A PO Take 1 tablet by mouth daily.     No current facility-administered medications for this visit.      Past Medical History:  Diagnosis Date  . Beta thalassemia trait   . Complication of anesthesia   . Diabetes mellitus without complication (HCC)   . Dysrhythmia    WPW-ablation  . GERD (gastroesophageal reflux disease)   . Headache   . Hyperlipemia   . Hypertension   . Obesity   . PONV (postoperative nausea and vomiting)   . Wolff-Parkinson-White syndrome     ROS:   All systems reviewed and negative except as noted in the HPI.   Past Surgical History:  Procedure Laterality Date  . ABDOMINAL HYSTERECTOMY    . BLADDER REPAIR    . BREAST  REDUCTION SURGERY    . CHONDROPLASTY Left 09/13/2015   Procedure: CHONDROPLASTY;  Surgeon: Gean BirchwoodFrank Rowan, MD;  Location: [facility];  Service: Orthopedics;  Laterality: Left;  . KNEE ARTHROSCOPY WITH DRILLING/MICROFRACTURE Left 09/13/2015   Procedure: KNEE ARTHROSCOPY WITH DRILLING/MICROFRACTURE;  Surgeon: Gean BirchwoodFrank Rowan, MD;  Location: [facility];  Service: Orthopedics;  Laterality: Left;  . SUPRAVENTRICULAR TACHYCARDIA ABLATION N/A 07/04/2014   Procedure: WPW ABLATION;  Surgeon: Marinus MawGregg W Raja Caputi, MD;  [facility];  Service: Cardiovascular;  Laterality: N/A;  . TUBAL LIGATION       Family History  Problem Relation Age of Onset  . Valvular heart disease Mother        Aortic valve replacement  . Diabetes Mother   . Heart attack Maternal Grandmother        50s  . Stroke Maternal Grandmother   . Diabetes Sister   . Breast cancer Sister        Half Sister  . Diabetes Brother   . Breast cancer Paternal Aunt   . Breast cancer Cousin [phone]     Social History   Socioeconomic History  . Marital status: Legally Separated    Spouse name: Not on file  . Number of children: 2  . Years of education: College  . Highest education level: Not on file  Social Needs  . Financial resource strain: Not on file  . Food insecurity - worry: Not on file  . Food insecurity - inability: Not on file  . Transportation needs - medical: Not on file  . Transportation needs - non-medical: Not on file  Occupational History  . Occupation: works at [employer], student  Tobacco Use  . Smoking status: Never Smoker  . Smokeless tobacco: Never Used  . Tobacco comment: "only smoked for one year al ong time ago "  Substance and Sexual Activity  . Alcohol use: Yes    Alcohol/week: 0.0 oz    Comment: occasional  . Drug use: No  . Sexual activity: Not on file  Other Topics Concern  . Not on file  Social History Narrative   Pt lives with mother, children and husband.    Caffeine use: small coffee per day   1 mountain dew per day     BP 110/78   Pulse 73   Ht 5\' 3"  (1.6 m)   Wt 211 lb 3.2 oz (95.8 kg)   BMI 37.41 kg/m   Physical Exam:  Well appearing 42 yo woman, NAD HEENT: Unremarkable Neck:  6 cm JVD, no thyromegally Lymphatics:  No adenopathy Back:  No CVA tenderness Lungs:  Clear with no  wheezes HEART:  Regular rate rhythm, no murmurs, no rubs, no clicks Abd:  soft, positive bowel sounds, no organomegally, no rebound, no guarding Ext:  2 plus pulses, no edema, no cyanosis, no clubbing Skin:  No rashes no nodules Neuro:  CN II through XII intact, motor grossly intact  EKG - NSR    Assess/Plan: 1. Palpitations - she has documented PVC's on her Apple watch. I have discussed switching from bystolic to flecainide. For now she will hold off on switching.  2. HTN - her blood pressure is well controlled. She will continue her beta blocker 3. Obesity - her BMI is over 37. She is encouraged to lose weight. 4. WPW - she is s/p ablation and ECG today does not demonstrate any pre-excitation.  Leonia Reeves.D.

## 2017-12-19 MED FILL — ROSUVASTATIN CALCIUM 5 MG T: 5 | 30 days supply | Qty: 30 | Fill #1

## 2017-12-19 MED FILL — REPAGLINIDE 0.5 MG TABLET: 0.5 | 90 days supply | Qty: 90 | Fill #2

## 2018-02-26 ENCOUNTER — Other Ambulatory Visit: Payer: Self-pay | Admitting: Endocrinology

## 2018-02-26 MED FILL — REPAGLINIDE 0.5 MG TABLET: 0.5 | 90 days supply | Qty: 90 | Fill #0

## 2018-02-26 MED FILL — HYDROCHLOROTHIAZIDE 12.5 MG: 12.5 | 30 days supply | Qty: 30 | Fill #0

## 2018-02-26 MED FILL — ROSUVASTATIN CALCIUM 5 MG T: 5 | 90 days supply | Qty: 90 | Fill #2

## 2018-03-04 ENCOUNTER — Other Ambulatory Visit: Payer: Self-pay | Admitting: Internal Medicine

## 2018-03-04 DIAGNOSIS — Z1231 Encounter for screening mammogram for malignant neoplasm of breast: Secondary | ICD-10-CM

## 2018-03-16 MED FILL — BYSTOLIC 5 MG TABLET: 5 | 90 days supply | Qty: 90 | Fill #1

## 2018-03-23 MED FILL — JARDIANCE 25 MG TABLET: 25 | 30 days supply | Qty: 30 | Fill #0

## 2018-03-24 ENCOUNTER — Ambulatory Visit
Admission: RE | Admit: 2018-03-24 | Discharge: 2018-03-24 | Disposition: A | Discharge status: Home / Self Care | Payer: No Typology Code available for payment source | Source: Ambulatory Visit | Attending: Internal Medicine | Admitting: Internal Medicine

## 2018-03-24 DIAGNOSIS — Z1231 Encounter for screening mammogram for malignant neoplasm of breast: Secondary | ICD-10-CM

## 2018-03-31 ENCOUNTER — Encounter: Payer: Self-pay | Admitting: Internal Medicine

## 2018-03-31 MED FILL — HYDROCHLOROTHIAZIDE 12.5 MG: 12.5 | 30 days supply | Qty: 30 | Fill #0

## 2018-04-24 MED FILL — JARDIANCE 25 MG TABLET: 25 | 30 days supply | Qty: 30 | Fill #1

## 2018-05-08 ENCOUNTER — Other Ambulatory Visit (HOSPITAL_COMMUNITY): Payer: Self-pay | Admitting: *Deleted

## 2018-05-11 ENCOUNTER — Ambulatory Visit (HOSPITAL_COMMUNITY)
Admission: RE | Admit: 2018-05-11 | Discharge: 2018-05-11 | Disposition: A | Discharge status: Home / Self Care | Payer: No Typology Code available for payment source | Source: Ambulatory Visit | Attending: Internal Medicine | Admitting: Internal Medicine

## 2018-05-11 DIAGNOSIS — E2740 Unspecified adrenocortical insufficiency: Secondary | ICD-10-CM | POA: Insufficient documentation

## 2018-05-11 LAB — ACTH STIMULATION, 3 TIME POINTS
CORTISOL 60 MIN: 25 ug/dL
CORTISOL BASE: 13.4 ug/dL
Cortisol, 30 Min: 23.6 ug/dL

## 2018-05-11 MED ORDER — COSYNTROPIN 0.25 MG IJ SOLR
INTRAMUSCULAR | Status: AC
  Administered 2018-05-11: 0.25 mg via INTRAVENOUS
  Filled 2018-05-11: qty 0.25

## 2018-05-11 MED ORDER — COSYNTROPIN 0.25 MG IJ SOLR
0.2500 mg | Freq: Once | INTRAMUSCULAR | Status: AC
  Administered 2018-05-11: 0.25 mg via INTRAVENOUS

## 2018-05-11 NOTE — Discharge Instructions (Signed)
ACTH Stimulation Test °Why am I having this test? °The adrenocorticotropic hormone (ACTH) stimulation test indirectly shows how well your adrenal glands are working. ACTH is a hormone that is produced by a gland in your brain called the pituitary gland. ACTH stimulates your two adrenal glands, which are located above each kidney. The adrenal glands produce hormones that are released into the blood. One of these hormones is cortisol. Cortisol helps your body to respond to stress. If your adrenal glands are not responding to ACTH properly, you may have too much or too little cortisol. °What kind of sample is taken? °Two or more blood samples are required for this test. Blood samples are usually collected by inserting a needle into a vein. Cortisol will be measured in the first blood sample to provide a baseline level. °After the first blood sample has been collected, you will be given cosyntropin. Cosyntropin is similar to ACTH and should cause the adrenal glands to release cortisol. Cosyntropin is usually given through an IV tube. It could also be given as an intramuscular (IM) injection. At specified intervals after receiving the cosyntropin, you will have one or more blood samples taken to measure your cortisol levels. The test results will be compared to show the amount of cortisol in your blood before and after you were given cosyntropin. °How do I prepare for this test? °Do not eat or drink anything after midnight on the night before the test or as directed by your health care provider. °What are the reference values? °Reference values are considered healthy values established after testing a large group of healthy people. Reference values may vary among different people, labs, and hospitals. It is your responsibility to obtain your test results. Ask the lab or department performing the test when and how you will get your results. °The following are reference values for the various ACTH stimulation  tests: °· Rapid test: cortisol levels increase greater than 7 mg/dL above baseline. °· 24-hour test: cortisol levels greater than 40 mcg/dL. °· 3-day test: cortisol levels greater than 40 mcg/dL. ° °What do the results mean? °Results outside of the reference value may indicate: °· Cushing syndrome. °· Adrenal insufficiency. ° °Talk with your health care provider to discuss your results, treatment options, and if necessary, the need for more tests. Talk with your health care provider if you have any questions about your results. °Talk with your health care provider to discuss your results, treatment options, and if necessary, the need for more tests. Talk with your health care provider if you have any questions about your results. °This information is not intended to replace advice given to you by your health care provider. Make sure you discuss any questions you have with your health care provider. °Document Released: 10/19/2010 Document Revised: 05/20/2016 Document Reviewed: 04/27/2014 °Elsevier Interactive Patient Education © 2018 [employer] ° °

## 2018-05-15 MED FILL — HYDROCHLOROTHIAZIDE 12.5 MG: 12.5 | 30 days supply | Qty: 30 | Fill #0

## 2018-05-18 MED FILL — JARDIANCE 25 MG TABLET: 25 | 30 days supply | Qty: 30 | Fill #2

## 2018-07-03 MED FILL — HYDROCHLOROTHIAZIDE 12.5 MG: 12.5 | 30 days supply | Qty: 30 | Fill #0

## 2018-07-03 MED FILL — BYSTOLIC 5 MG TABLET: 5 | 90 days supply | Qty: 90 | Fill #2

## 2018-07-03 MED FILL — REPAGLINIDE 0.5 MG TABLET: 0.5 | 90 days supply | Qty: 90 | Fill #1

## 2018-07-31 ENCOUNTER — Ambulatory Visit: Payer: No Typology Code available for payment source | Admitting: Internal Medicine

## 2018-07-31 ENCOUNTER — Encounter: Payer: Self-pay | Admitting: Internal Medicine

## 2018-07-31 VITALS — BP 124/66 | HR 75 | Ht 62.0 in | Wt 203.0 lb

## 2018-07-31 DIAGNOSIS — R002 Palpitations: Secondary | ICD-10-CM | POA: Diagnosis not present

## 2018-07-31 DIAGNOSIS — I10 Essential (primary) hypertension: Secondary | ICD-10-CM

## 2018-07-31 NOTE — Patient Instructions (Addendum)
Medication Instructions:  Your physician recommends that you continue on your current medications as directed. Please refer to the Current Medication list given to you today.  If you need a refill on your cardiac medications before your next appointment, please call your pharmacy.   Lab work: None ordered.  If you have labs (blood work) drawn today and your tests are completely normal, you will receive your results only by: . MyChart Message (if you have MyChart) OR . A paper copy in the mail If you have any lab test that is abnormal or we need to change your treatment, we will call you to review the results.  Testing/Procedures: None ordered.  Follow-Up:  Your physician wants you to follow-up in: 6 months with Dr. Taylor.    You will receive a reminder letter in the mail two months in advance. If you don't receive a letter, please call our office to schedule the follow-up appointment.  At [facility], you and your health needs are our priority.  As part of our continuing mission to provide you with exceptional heart care, we have created designated Provider Care Teams.  These Care Teams include your primary Cardiologist (physician) and Advanced Practice Providers (APPs -  Physician Assistants and Nurse Practitioners) who all work together to provide you with the care you need, when you need it. . You may see Gregg Taylor, MD or one of the following Advanced Practice Providers on your designated Care Team:   . Amber Seiler, NP . Renee Ursuy, PA-C   Any Other Special Instructions Will Be Listed Below (If Applicable). 

## 2018-07-31 NOTE — Progress Notes (Signed)
HPI Ms. Carolyn Phillips returns today for followup. She is a pleasant 42 yo woman with a remote h/o WPW s/p ablation. She developed palpitations and was seen by me and started monitoring her heart with her apple watch. She has had documented PVC's. No chest pain or sob. She has been treated for pneumonia. No syncope.In the interim, she has improved with a reduction in her PVC's. She has been losing weight and is down almost 18 lbs. Allergies  Allergen Reactions  . Penicillins Hives and Swelling    Has patient had a PCN reaction causing immediate rash, facial/tongue/throat swelling, SOB or lightheadedness with hypotension: Yes Has patient had a PCN reaction causing severe rash involving mucus membranes or skin necrosis: No Has patient had a PCN reaction that required hospitalization No Has patient had a PCN reaction occurring within the last 10 years: No If all of the above answers are "NO", then may proceed with Cephalosporin use.   Eye swelling  . Metoprolol     "LOL" Drugs cause hair to fall out  . Nitrofurantoin Macrocrystal Other (See Comments)  . Other Nausea And Vomiting    General Anesthesia      Current Outpatient Medications  Medication Sig Dispense Refill  . acetaminophen (TYLENOL) 500 MG tablet Take 1,000 mg by mouth every 6 (six) hours as needed for mild pain.    Marland Kitchen albuterol (PROVENTIL HFA;VENTOLIN HFA) 108 (90 Base) MCG/ACT inhaler Inhale 2 puffs into the lungs every 4 (four) hours as needed for wheezing or shortness of breath. Dispense with aerochamber 1 Inhaler 0  . Biotin 2500 MCG CAPS Take 2,500 mcg by mouth daily.     . Blood Glucose Monitoring Suppl (FREESTYLE LITE) DEVI Use to check blood sugar 1 time per day. Dx Code: E11.9 1 each 2  . cetirizine (ZYRTEC) 10 MG tablet Take 10 mg by mouth daily as needed for allergies.     . Cholecalciferol (VITAMIN D3) 3000 UNITS TABS Take 5,000 Units by mouth daily.     . Cyanocobalamin (VITAMIN B 12 PO) Take 1 tablet by mouth  daily.    . cyclobenzaprine (FLEXERIL) 10 MG tablet Take 1 tablet (10 mg total) by mouth 2 (two) times daily as needed for muscle spasms. 20 tablet 0  . fluticasone (FLONASE) 50 MCG/ACT nasal spray Place 2 sprays into both nostrils daily. 1 g 0  . glucose blood (FREESTYLE LITE) test strip Use to check blood sugar 1 time per day. Dx Code: E11.9 100 each 2  . ibuprofen (ADVIL,MOTRIN) 200 MG tablet Take 200 mg by mouth every 6 (six) hours as needed for mild pain.    . Lancets (FREESTYLE) lancets Use to check blood sugar 1 time per day. Dx Code: E11.9 100 each 2  . meclizine (ANTIVERT) 25 MG tablet TAKE 1 TABLET ( 25MG ) BY MOUTH THREE TIME DAILY AS NEEDED FOR DIZZINESS    . methocarbamol (ROBAXIN) 500 MG tablet TAKE 1 TABLET (500MG ) BY MOUTH THREE TIME DAILY AS NEEDED FOR MUSCLE SPASM    . Multiple Vitamins-Minerals (ZINC PO) Take 1 tablet by mouth daily.    . nebivolol (BYSTOLIC) 5 MG tablet Take 1 tablet (5 mg) daily as needed (Patient taking differently: 5 mg daily. ) 90 tablet 3  . repaglinide (PRANDIN) 0.5 MG tablet TAKE 1 TABLET BY MOUTH DAILY WITH SUPPER 90 tablet 3  . rosuvastatin (CRESTOR) 5 MG tablet Take 1 tablet (5 mg total) by mouth daily. 90 tablet 3  . Spacer/Aero-Holding  Chambers (AEROCHAMBER PLUS) inhaler Use as instructed 1 each 2  . VITAMIN A PO Take 1 tablet by mouth daily.     No current facility-administered medications for this visit.      Past Medical History:  Diagnosis Date  . Beta thalassemia trait   . Complication of anesthesia   . Diabetes mellitus without complication (HCC)   . Dysrhythmia    WPW-ablation  . GERD (gastroesophageal reflux disease)   . Headache   . Hyperlipemia   . Hypertension   . Obesity   . PONV (postoperative nausea and vomiting)   . Wolff-Parkinson-White syndrome     ROS:   All systems reviewed and negative except as noted in the HPI.   Past Surgical History:  Procedure Laterality Date  . ABDOMINAL HYSTERECTOMY    . BLADDER  REPAIR    . BREAST REDUCTION SURGERY    . CHONDROPLASTY Left 09/13/2015   Procedure: CHONDROPLASTY;  Surgeon: Carolyn Birchwood, MD;  Location: [facility];  Service: Orthopedics;  Laterality: Left;  . KNEE ARTHROSCOPY WITH DRILLING/MICROFRACTURE Left 09/13/2015   Procedure: KNEE ARTHROSCOPY WITH DRILLING/MICROFRACTURE;  Surgeon: Carolyn Birchwood, MD;  Location: [facility];  Service: Orthopedics;  Laterality: Left;  . REDUCTION MAMMAPLASTY Bilateral 2001  . SUPRAVENTRICULAR TACHYCARDIA ABLATION N/A 07/04/2014   Procedure: WPW ABLATION;  Surgeon: Carolyn Maw, MD;  Location: [facility] CATH LAB;  Service: Cardiovascular;  Laterality: N/A;  . TUBAL LIGATION       Family History  Problem Relation Age of Onset  . Valvular heart disease Mother        Aortic valve replacement  . Diabetes Mother   . Heart attack Maternal Grandmother        50s  . Stroke Maternal Grandmother   . Diabetes Sister   . Breast cancer Sister        Half Sister  . Diabetes Brother   . Breast cancer Paternal Aunt   . Breast cancer Cousin 41     Social History   Socioeconomic History  . Marital status: Legally Separated    Spouse name: Not on file  . Number of children: 2  . Years of education: College  . Highest education level: Not on file  Occupational History  . Occupation: works at [employer], student  Social Needs  . Financial resource strain: Not on file  . Food insecurity:    Worry: Not on file    Inability: Not on file  . Transportation needs:    Medical: Not on file    Non-medical: Not on file  Tobacco Use  . Smoking status: Never Smoker  . Smokeless tobacco: Never Used  . Tobacco comment: "only smoked for one year al ong time ago "  Substance and Sexual Activity  . Alcohol use: Yes    Alcohol/week: 0.0 standard drinks    Comment: occasional  . Drug use: No  . Sexual activity: Not on file  Lifestyle  . Physical activity:    Days per week: Not on file    Minutes per  session: Not on file  . Stress: Not on file  Relationships  . Social connections:    Talks on phone: Not on file    Gets together: Not on file    Attends religious service: Not on file    Active member of club or organization: Not on file    Attends meetings of clubs or organizations: Not on file    Relationship status: Not on  file  . Intimate partner violence:    Fear of current or ex partner: Not on file    Emotionally abused: Not on file    Physically abused: Not on file    Forced sexual activity: Not on file  Other Topics Concern  . Not on file  Social History Narrative   Pt lives with mother, children and husband.   Caffeine use: small coffee per day   1 mountain dew per day     BP 124/66   Pulse 75   Ht 5\' 2"  (1.575 m)   Wt 203 lb (92.1 kg)   BMI 37.13 kg/m   Physical Exam:  Well appearing NAD HEENT: Unremarkable Neck:  No JVD, no thyromegally Lymphatics:  No adenopathy Back:  No CVA tenderness Lungs:  Clear with no wheezes HEART:  Regular rate rhythm, no murmurs, no rubs, no clicks Abd:  soft, positive bowel sounds, no organomegally, no rebound, no guarding Ext:  2 plus pulses, no edema, no cyanosis, no clubbing Skin:  No rashes no nodules Neuro:  CN II through XII intact, motor grossly intact  EKG - nsr  Assess/Plan: 1. PVC's - her symptoms appear to be well controlled. She will continue her beta blocker. We discussed flecainide but at this point I do not think her symptoms are severe enough to warrant the use of flecainide. 2. HTN - her bp today is good. She thinks it has improved since losing weight. 3. Obesity - she has been losing weight by changing her diet. Continue.  Leonia Reeves.D.

## 2018-08-03 ENCOUNTER — Ambulatory Visit (INDEPENDENT_AMBULATORY_CARE_PROVIDER_SITE_OTHER): Payer: Self-pay | Admitting: Family Medicine

## 2018-08-03 ENCOUNTER — Encounter: Payer: Self-pay | Admitting: Family Medicine

## 2018-08-03 VITALS — BP 110/80 | HR 74 | Temp 98.5°F | Wt 208.2 lb

## 2018-08-03 DIAGNOSIS — J302 Other seasonal allergic rhinitis: Secondary | ICD-10-CM

## 2018-08-03 DIAGNOSIS — Z87898 Personal history of other specified conditions: Secondary | ICD-10-CM

## 2018-08-03 MED ORDER — FLUTICASONE PROPIONATE 50 MCG/ACT NA SUSP: 2.0000 | Freq: Every day | NASAL | 0 refills | Status: DC

## 2018-08-03 MED ORDER — LORATADINE 10 MG PO TABS: 10.0000 mg | ORAL_TABLET | Freq: Every day | ORAL | 11 refills | Status: DC

## 2018-08-03 MED FILL — FLUTICASONE PROP 50 MCG SPR: 50 | 30 days supply | Qty: 16 | Fill #0

## 2018-08-03 MED FILL — HYDROCHLOROTHIAZIDE 12.5 MG: 12.5 | 30 days supply | Qty: 30 | Fill #1

## 2018-08-03 NOTE — Patient Instructions (Signed)

## 2018-08-03 NOTE — Progress Notes (Signed)
Carolyn Phillips is a 42 y.o. female who presents today with concerns of headache and right sided ear pain intermittently for the last 3 days with the most pain experienced this morning. Patient has a history of vertigo (treated with head rotation not medications) and is concerned that any type of cold or URI will trigger her symptoms. She also has a history of headache and a few migraines in her life but she is not under care for this condition. She has attempted to treat her conditions with tylenol/motrin with little relief. She has a history of cardiac issues "WPW" and underwent a cardiac ablation and does not have any current symptoms but is cautious with medications due to this concern.  Review of Systems  Constitutional: Negative for chills, fever and malaise/fatigue.  HENT: Positive for ear pain. Negative for congestion, ear discharge, sinus pain and sore throat.   Eyes: Negative.   Respiratory: Positive for cough. Negative for sputum production and shortness of breath.   Cardiovascular: Negative.  Negative for chest pain.  Gastrointestinal: Negative for abdominal pain, diarrhea, nausea and vomiting.  Genitourinary: Negative for dysuria, frequency, hematuria and urgency.  Musculoskeletal: Negative for myalgias.  Skin: Negative.   Neurological: Positive for headaches.  Endo/Heme/Allergies: Negative.   Psychiatric/Behavioral: Negative.     O: Vitals:   08/03/18 1228  BP: 110/80  Pulse: 74  Temp: 98.5 F (36.9 C)  SpO2: 99%     Physical Exam  Constitutional: She is oriented to person, place, and time. Vital signs are normal. She appears well-developed and well-nourished. She is active.  Non-toxic appearance. She does not have a sickly appearance. She does not appear ill.  HENT:  Head: Normocephalic.  Right Ear: Hearing, external ear and ear canal normal. A middle ear effusion is present.  Left Ear: Hearing, tympanic membrane, external ear and ear canal normal.  Nose:  Rhinorrhea present. Right sinus exhibits no maxillary sinus tenderness and no frontal sinus tenderness. Left sinus exhibits no maxillary sinus tenderness and no frontal sinus tenderness.  Mouth/Throat: Uvula is midline and oropharynx is clear and moist.  Neck: Normal range of motion. Neck supple.  Cardiovascular: Normal rate, regular rhythm, normal heart sounds and normal pulses.  Pulmonary/Chest: Effort normal and breath sounds normal.  Abdominal: Soft. Bowel sounds are normal.  Musculoskeletal: Normal range of motion.  Lymphadenopathy:       Head (right side): No submental, no submandibular and no tonsillar adenopathy present.       Head (left side): No submental, no submandibular and no tonsillar adenopathy present.    She has no cervical adenopathy.    She has no axillary adenopathy.  Neurological: She is alert and oriented to person, place, and time.  Psychiatric: She has a normal mood and affect.  Vitals reviewed.  A: 1. Seasonal allergies   2. History of vertigo    P: Discussed exam findings, diagnosis etiology and medication use and indications reviewed with patient. Follow- Up and discharge instructions provided. No emergent/urgent issues found on exam.  Patient verbalized understanding of information provided and agrees with plan of care (POC), all questions answered.  1. Seasonal allergies - loratadine (CLARITIN) 10 MG tablet; Take 1 tablet (10 mg total) by mouth daily. - fluticasone (FLONASE) 50 MCG/ACT nasal spray; Place 2 sprays into both nostrils daily.  Suspect viral URI- discussed treating symptoms- hx of seasonal allergies treatment focus is to dry up fluid and prevent reservoir for bacteria that would trigger BPPV or create the need for  abx. Discussed mild nature of Claritin and Flonase to keep nasal passages open.  2. History of vertigo No current exacerbation-

## 2018-08-28 MED FILL — ROSUVASTATIN CALCIUM 5 MG T: 5 | 90 days supply | Qty: 90 | Fill #3

## 2018-08-28 MED FILL — ACCU-CHEK GUIDE TEST STRIP: 90 days supply | Qty: 300 | Fill #0

## 2018-08-28 MED FILL — HYDROCHLOROTHIAZIDE 25 MG T: 25 | 90 days supply | Qty: 90 | Fill #0

## 2018-08-28 MED FILL — JARDIANCE 25 MG TABLET: 25 | 30 days supply | Qty: 30 | Fill #0

## 2018-08-28 MED FILL — predniSONE 10 MG TABS: 10 | 6 days supply | Qty: 21 | Fill #0

## 2018-09-07 ENCOUNTER — Other Ambulatory Visit (HOSPITAL_COMMUNITY): Payer: Self-pay | Admitting: Internal Medicine

## 2018-09-07 DIAGNOSIS — R600 Localized edema: Secondary | ICD-10-CM

## 2018-09-07 DIAGNOSIS — I2510 Atherosclerotic heart disease of native coronary artery without angina pectoris: Secondary | ICD-10-CM

## 2018-09-11 ENCOUNTER — Ambulatory Visit (HOSPITAL_COMMUNITY): Payer: No Typology Code available for payment source | Attending: Cardiology

## 2018-09-11 ENCOUNTER — Other Ambulatory Visit: Payer: Self-pay

## 2018-09-11 DIAGNOSIS — I2510 Atherosclerotic heart disease of native coronary artery without angina pectoris: Secondary | ICD-10-CM | POA: Insufficient documentation

## 2018-09-11 DIAGNOSIS — R600 Localized edema: Secondary | ICD-10-CM | POA: Diagnosis present

## 2018-11-04 MED FILL — JARDIANCE 25 MG TABLET: 25 | 30 days supply | Qty: 30 | Fill #1

## 2018-11-15 ENCOUNTER — Ambulatory Visit (HOSPITAL_COMMUNITY)
Admission: EM | Admit: 2018-11-15 | Discharge: 2018-11-15 | Disposition: A | Discharge status: Home / Self Care | Payer: No Typology Code available for payment source | Attending: Family Medicine | Admitting: Family Medicine

## 2018-11-15 ENCOUNTER — Encounter (HOSPITAL_COMMUNITY): Payer: Self-pay | Admitting: Emergency Medicine

## 2018-11-15 ENCOUNTER — Ambulatory Visit (INDEPENDENT_AMBULATORY_CARE_PROVIDER_SITE_OTHER): Payer: Self-pay | Admitting: Nurse Practitioner

## 2018-11-15 ENCOUNTER — Ambulatory Visit (INDEPENDENT_AMBULATORY_CARE_PROVIDER_SITE_OTHER): Payer: No Typology Code available for payment source

## 2018-11-15 DIAGNOSIS — B9789 Other viral agents as the cause of diseases classified elsewhere: Secondary | ICD-10-CM | POA: Diagnosis not present

## 2018-11-15 DIAGNOSIS — J069 Acute upper respiratory infection, unspecified: Secondary | ICD-10-CM

## 2018-11-15 DIAGNOSIS — R05 Cough: Secondary | ICD-10-CM

## 2018-11-15 DIAGNOSIS — R062 Wheezing: Secondary | ICD-10-CM | POA: Diagnosis not present

## 2018-11-15 DIAGNOSIS — Z0000 Encounter for general adult medical examination without abnormal findings: Secondary | ICD-10-CM

## 2018-11-15 MED ORDER — BENZONATATE 200 MG PO CAPS: 200.0000 mg | ORAL_CAPSULE | Freq: Two times a day (BID) | ORAL | 0 refills | Status: DC | PRN

## 2018-11-15 MED ORDER — IBUPROFEN 800 MG PO TABS: 800.0000 mg | ORAL_TABLET | Freq: Three times a day (TID) | ORAL | 0 refills | Status: DC

## 2018-11-15 MED ORDER — DM-GUAIFENESIN ER 30-600 MG PO TB12: 1.0000 | ORAL_TABLET | Freq: Two times a day (BID) | ORAL | 0 refills | Status: DC

## 2018-11-15 NOTE — Progress Notes (Signed)
Erroneous encounter

## 2018-11-15 NOTE — ED Provider Notes (Signed)
[facility]    CSN: [id number] Arrival date & time: 11/15/18  1018     History   Chief Complaint Chief Complaint  Patient presents with  . Fever  . Cough    HPI Carolyn Phillips is a 43 y.o. female.   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  We have been seeing a lot of respiratory illness and flu.  She is here for cough, fever, wheezing of 4 days duration.  Fatigue.  Some body aches.  She has had some sweats and chills but has not taken her temperature.  Low-grade fever.  She went to her physician and had an influenza test which was negative.  She is here for continued symptoms and coughing.  Some nausea but no vomiting.  Very fatigued  Past Medical History:  Diagnosis Date  . Beta thalassemia trait   . Complication of anesthesia   . Diabetes mellitus without complication (HCC)   . Dysrhythmia    WPW-ablation  . GERD (gastroesophageal reflux disease)   . Headache   . Hyperlipemia   . Hypertension   . Obesity   . PONV (postoperative nausea and vomiting)   . Wolff-Parkinson-White syndrome     Patient Active Problem List   Diagnosis Date Noted  . Left knee pain 07/08/2017  . Dysuria 10/24/2016  . Paresthesias 06/02/2016  . Diabetes mellitus without complication (HCC)   . Hyperlipemia   . Chest pain 01/11/2016  . Diabetes mellitus (HCC) 01/11/2016  . Dyslipidemia 01/11/2016  . Hypokalemia 01/11/2016  . Wolff-Parkinson-White syndrome 07/04/2014  . Wolff-Parkinson-White (WPW) syndrome 07/04/2014  . Hypertension 05/25/2014  . WPW (Wolff-Parkinson-White syndrome) 05/25/2014  . Sleep apnea 05/25/2014    Past Surgical History:  Procedure Laterality Date  . ABDOMINAL HYSTERECTOMY    . BLADDER REPAIR    . BREAST REDUCTION SURGERY    . CHONDROPLASTY Left 09/13/2015   Procedure: CHONDROPLASTY;  Surgeon: Gean BirchwoodFrank Rowan, MD;  Location: [facility];  Service: Orthopedics;  Laterality: Left;  . KNEE  ARTHROSCOPY WITH DRILLING/MICROFRACTURE Left 09/13/2015   Procedure: KNEE ARTHROSCOPY WITH DRILLING/MICROFRACTURE;  Surgeon: Gean BirchwoodFrank Rowan, MD;  Location: [facility];  Service: Orthopedics;  Laterality: Left;  . REDUCTION MAMMAPLASTY Bilateral 2001  . SUPRAVENTRICULAR TACHYCARDIA ABLATION N/A 07/04/2014   Procedure: WPW ABLATION;  Surgeon: Marinus MawGregg W Taylor, MD;  [facility];  Service: Cardiovascular;  Laterality: N/A;  . TUBAL LIGATION      OB History   No obstetric history on file.      Home Medications    Prior to Admission medications   Medication Sig Start Date End Date Taking? Authorizing Provider  acetaminophen (TYLENOL) 500 MG tablet Take 1,000 mg by mouth every 6 (six) hours as needed for mild pain.    [provider]  albuterol (PROVENTIL HFA;VENTOLIN HFA) 108 (90 Base) MCG/ACT inhaler Inhale 2 puffs into the lungs every 4 (four) hours as needed for wheezing or shortness of breath. Dispense with aerochamber 10/17/17   Domenick GongMortenson, Ashley, MD  benzonatate (TESSALON) 200 MG capsule Take 1 capsule (200 mg total) by mouth 2 (two) times daily as needed for cough. 11/15/18   Eustace MooreNelson, Yvonne Sue, MD  Biotin 2500 MCG CAPS Take 2,500 mcg by mouth daily.     [provider]  Blood Glucose Monitoring Suppl (FREESTYLE LITE) DEVI Use to check blood sugar 1 time per day. Dx Code: E11.9 11/01/16   Romero BellingEllison, Sean, MD  cetirizine (  ZYRTEC) 10 MG tablet Take 10 mg by mouth daily as needed for allergies.     [provider]  Cholecalciferol (VITAMIN D3) 3000 UNITS TABS Take 5,000 Units by mouth daily.     [provider]  Cyanocobalamin (VITAMIN B 12 PO) Take 1 tablet by mouth daily.    [provider]  cyclobenzaprine (FLEXERIL) 10 MG tablet Take 1 tablet (10 mg total) by mouth 2 (two) times daily as needed for muscle spasms. 10/19/17   Alvira Monday, MD  dextromethorphan-guaiFENesin (MUCINEX DM) 30-600 MG 12hr tablet Take 1 tablet by mouth  2 (two) times daily. 11/15/18   Eustace Moore, MD  fluticasone [facility]) 50 MCG/ACT nasal spray Place 2 sprays into both nostrils daily. 08/03/18   Zachery Dauer, NP  glucose blood (FREESTYLE LITE) test strip Use to check blood sugar 1 time per day. Dx Code: E11.9 11/01/16   Romero Belling, MD  ibuprofen (ADVIL,MOTRIN) 800 MG tablet Take 1 tablet (800 mg total) by mouth 3 (three) times daily. 11/15/18   Eustace Moore, MD  Lancets (FREESTYLE) lancets Use to check blood sugar 1 time per day. Dx Code: E11.9 11/01/16   Romero Belling, MD  loratadine (CLARITIN) 10 MG tablet Take 1 tablet (10 mg total) by mouth daily. 08/03/18   Zachery Dauer, NP  meclizine (ANTIVERT) 25 MG tablet TAKE 1 TABLET ( 25MG ) BY MOUTH THREE TIME DAILY AS NEEDED FOR DIZZINESS    [provider]  methocarbamol (ROBAXIN) 500 MG tablet TAKE 1 TABLET (500MG ) BY MOUTH THREE TIME DAILY AS NEEDED FOR MUSCLE SPASM    [provider]  Multiple Vitamins-Minerals (ZINC PO) Take 1 tablet by mouth daily.    [provider]  nebivolol (BYSTOLIC) 5 MG tablet Take 1 tablet (5 mg) daily as needed Patient taking differently: 5 mg daily.  10/07/17   Marinus Maw, MD  repaglinide (PRANDIN) 0.5 MG tablet TAKE 1 TABLET BY MOUTH DAILY WITH SUPPER 02/26/18   Romero Belling, MD  rosuvastatin (CRESTOR) 5 MG tablet Take 1 tablet (5 mg total) by mouth daily. 10/07/17   Marinus Maw, MD  Spacer/Aero-Holding Chambers (AEROCHAMBER PLUS) inhaler Use as instructed 10/17/17   Domenick Gong, MD  VITAMIN A PO Take 1 tablet by mouth daily.    [provider]    Family History Family History  Problem Relation Age of Onset  . Valvular heart disease Mother        Aortic valve replacement  . Diabetes Mother   . Heart attack Maternal Grandmother        50s  . Stroke Maternal Grandmother   . Diabetes Sister   . Breast cancer Sister        Half Sister  . Diabetes Brother   . Breast cancer Paternal Aunt   . Breast cancer  Cousin 11    Social History Social History   Tobacco Use  . Smoking status: Never Smoker  . Smokeless tobacco: Never Used  . Tobacco comment: "only smoked for one year al ong time ago "  Substance Use Topics  . Alcohol use: Yes    Alcohol/week: 0.0 standard drinks    Comment: occasional  . Drug use: No     Allergies   Penicillins; Metoprolol; Nitrofurantoin macrocrystal; and Other   Review of Systems Review of Systems  Constitutional: Positive for chills, fatigue and fever.  HENT: Negative for ear pain and sore throat.   Eyes: Negative for pain and visual disturbance.  Respiratory:  Positive for cough and wheezing. Negative for shortness of breath.   Cardiovascular: Negative for chest pain and palpitations.  Gastrointestinal: Negative for abdominal pain and vomiting.  Genitourinary: Negative for dysuria and hematuria.  Musculoskeletal: Positive for myalgias. Negative for arthralgias and back pain.  Skin: Negative for color change and rash.  Neurological: Negative for seizures and syncope.  All other systems reviewed and are negative.    Physical Exam Triage Vital Signs ED Triage Vitals [11/15/18 1039]  Enc Vitals Group     BP 129/84     Pulse Rate 79     Resp 18     Temp 98.1 F (36.7 C)     Temp src      SpO2 99 %     Weight      Height      Head Circumference      Peak Flow      Pain Score 9     Pain Loc      Pain Edu?      Excl. in GC?    No data found.  Updated Vital Signs BP 129/84   Pulse 79   Temp 98.1 F (36.7 C)   Resp 18   SpO2 99%     Bilateral Near:     Physical Exam Constitutional:      General: She is not in acute distress.    Appearance: She is well-developed and normal weight. She is ill-appearing.  HENT:     Head: Normocephalic and atraumatic.     Right Ear: Tympanic membrane, ear canal and external ear normal.     Left Ear: Tympanic membrane, ear canal and external ear normal.     Nose: Congestion present.      Mouth/Throat:     Mouth: Mucous membranes are moist.  Eyes:     Conjunctiva/sclera: Conjunctivae normal.     Pupils: Pupils are equal, round, and reactive to light.  Neck:     Musculoskeletal: Normal range of motion and neck supple.  Cardiovascular:     Rate and Rhythm: Regular rhythm. Bradycardia present.  Pulmonary:     Effort: Pulmonary effort is normal. No respiratory distress.     Breath sounds: Wheezing and rhonchi present.     Comments: Patient has some central rhonchi.  Wheezes right greater than left that are scattered.  Chest x-ray is negative Abdominal:     General: There is no distension.     Palpations: Abdomen is soft.     Tenderness: There is no abdominal tenderness.  Musculoskeletal: Normal range of motion.  Lymphadenopathy:     Cervical: Cervical adenopathy present.  Skin:    General: Skin is warm and dry.  Neurological:     Mental Status: She is alert.      UC Treatments / Results  Labs (all labs ordered are listed, but only abnormal results are displayed) Labs Reviewed - No data to display  EKG None  Radiology Dg Chest 2 View  Result Date: 11/15/2018 CLINICAL DATA:  Headache and fever. EXAM: CHEST - 2 VIEW COMPARISON:  October 17, 2017 FINDINGS: The heart size and mediastinal contours are within normal limits. Both lungs are clear. The visualized skeletal structures are unremarkable. IMPRESSION: No active cardiopulmonary disease. Electronically Signed   By: Gerome Sam III M.D   On: 11/15/2018 12:12    Procedures Procedures (including critical care time)  Medications Ordered in UC Medications - No data to display  Initial Impression / Assessment and Plan /  UC Course  I have reviewed the triage vital signs and the nursing notes.  Pertinent labs & imaging results that were available during my care of the patient were reviewed by me and considered in my medical decision making (see chart for details).    We discussed that even though her  influenza test was negative, it is not 100% accurate.  She certainly has a flu like illness.  Is late in the game to be giving her Tamiflu.  I gave her prescriptions to help with her discomfort.  She will likely need to be off work for another couple of days.  Will follow Final Clinical Impressions(s) / UC Diagnoses   Final diagnoses:  Viral upper respiratory tract infection with cough     Discharge Instructions     Drink plenty of fluids Use a humidifier if you have one Take Tessalon 200 mg twice a day In addition take Mucinex DM twice a day This will give you the best cough control Take ibuprofen 800 mg 3 times a day with food for pain, body aches, fever   ED Prescriptions    Medication Sig Dispense Auth. Provider   benzonatate (TESSALON) 200 MG capsule Take 1 capsule (200 mg total) by mouth 2 (two) times daily as needed for cough. 20 capsule Eustace Moore, MD   dextromethorphan-guaiFENesin [facility] DM) 30-600 MG 12hr tablet Take 1 tablet by mouth 2 (two) times daily. 20 tablet Eustace Moore, MD   ibuprofen (ADVIL,MOTRIN) 800 MG tablet Take 1 tablet (800 mg total) by mouth 3 (three) times daily. 21 tablet Eustace Moore, MD     Controlled Substance Prescriptions [place] Controlled Substance Registry consulted? Not Applicable   Eustace Moore, MD 11/15/18 2101

## 2018-11-15 NOTE — Discharge Instructions (Signed)
Drink plenty of fluids Use a humidifier if you have one Take Tessalon 200 mg twice a day In addition take Mucinex DM twice a day This will give you the best cough control Take ibuprofen 800 mg 3 times a day with food for pain, body aches, fever

## 2018-11-15 NOTE — ED Triage Notes (Signed)
Pt c/o fever, cough, wheezing since Thursday.

## 2018-11-16 MED FILL — OSELTAMIVIR PHOSPHATE 75 MG: 75 | 5 days supply | Qty: 10 | Fill #0

## 2018-12-09 ENCOUNTER — Other Ambulatory Visit: Payer: Self-pay | Admitting: Internal Medicine

## 2018-12-09 MED FILL — REPAGLINIDE 0.5 MG TABLET: 0.5 | 90 days supply | Qty: 90 | Fill #2

## 2018-12-09 MED FILL — ROSUVASTATIN CALCIUM 5 MG T: 5 | 90 days supply | Qty: 90 | Fill #0

## 2018-12-09 MED FILL — HYDROCHLOROTHIAZIDE 25 MG T: 25 | 90 days supply | Qty: 90 | Fill #1

## 2018-12-09 MED FILL — JARDIANCE 25 MG TABLET: 25 | 30 days supply | Qty: 30 | Fill #2 | Status: TO

## 2018-12-30 ENCOUNTER — Other Ambulatory Visit: Payer: Self-pay | Admitting: Internal Medicine

## 2018-12-30 DIAGNOSIS — R590 Localized enlarged lymph nodes: Secondary | ICD-10-CM

## 2019-01-04 ENCOUNTER — Other Ambulatory Visit: Payer: Self-pay

## 2019-01-04 ENCOUNTER — Ambulatory Visit
Admission: RE | Admit: 2019-01-04 | Discharge: 2019-01-04 | Disposition: A | Discharge status: Home / Self Care | Payer: No Typology Code available for payment source | Source: Ambulatory Visit | Attending: Internal Medicine | Admitting: Internal Medicine

## 2019-01-04 DIAGNOSIS — R590 Localized enlarged lymph nodes: Secondary | ICD-10-CM

## 2019-01-04 MED ORDER — IOPAMIDOL (ISOVUE-300) INJECTION 61%
75.0000 mL | Freq: Once | INTRAVENOUS | Status: AC | PRN
  Administered 2019-01-04: 75 mL via INTRAVENOUS

## 2019-01-15 ENCOUNTER — Other Ambulatory Visit: Payer: Self-pay | Admitting: Internal Medicine

## 2019-01-15 MED FILL — JARDIANCE 25 MG TABLET: 25 | 30 days supply | Qty: 30 | Fill #0

## 2019-01-15 MED FILL — BYSTOLIC 5 MG TABLET: 5 | 90 days supply | Qty: 90 | Fill #0

## 2019-03-04 MED FILL — JARDIANCE 25 MG TABLET: 25 | 30 days supply | Qty: 30 | Fill #1

## 2019-03-17 MED FILL — JARDIANCE 25 MG TABLET: 25 | 30 days supply | Qty: 30 | Fill #1

## 2019-04-01 MED FILL — AZITHROMYCIN 250 MG TABLET: 250 | 5 days supply | Qty: 6 | Fill #0

## 2019-04-06 MED FILL — HYDROCHLOROTHIAZIDE 25 MG T: 25 | 90 days supply | Qty: 90 | Fill #2

## 2019-04-27 MED FILL — JARDIANCE 25 MG TABLET: 25 | 30 days supply | Qty: 30 | Fill #0

## 2019-05-10 DIAGNOSIS — E559 Vitamin D deficiency, unspecified: Secondary | ICD-10-CM | POA: Insufficient documentation

## 2019-05-10 MED FILL — MUPIROCIN 2% OINTMENT: 2 | 7 days supply | Qty: 22 | Fill #0

## 2019-05-14 MED FILL — BYSTOLIC 5 MG TABLET: 5 | 90 days supply | Qty: 90 | Fill #0

## 2019-05-14 MED FILL — ROSUVASTATIN CALCIUM 5 MG T: 5 | 90 days supply | Qty: 90 | Fill #1

## 2019-05-25 MED FILL — JARDIANCE 25 MG TABLET: 25 | 30 days supply | Qty: 30 | Fill #1

## 2019-07-02 MED FILL — FLUCONAZOLE 150 MG TABLET: 150 | 4 days supply | Qty: 2 | Fill #0

## 2019-07-05 MED FILL — JARDIANCE 25 MG TABLET: 25 | 30 days supply | Qty: 30 | Fill #0

## 2019-07-05 MED FILL — HYDROCHLOROTHIAZIDE 25 MG T: 25 | 90 days supply | Qty: 90 | Fill #0

## 2019-07-25 ENCOUNTER — Ambulatory Visit (INDEPENDENT_AMBULATORY_CARE_PROVIDER_SITE_OTHER)
Admission: RE | Admit: 2019-07-25 | Discharge: 2019-07-25 | Disposition: A | Discharge status: Home / Self Care | Payer: No Typology Code available for payment source | Source: Ambulatory Visit

## 2019-07-25 DIAGNOSIS — M25552 Pain in left hip: Secondary | ICD-10-CM

## 2019-07-25 DIAGNOSIS — R109 Unspecified abdominal pain: Secondary | ICD-10-CM

## 2019-07-25 DIAGNOSIS — E109 Type 1 diabetes mellitus without complications: Secondary | ICD-10-CM

## 2019-07-25 DIAGNOSIS — Z8669 Personal history of other diseases of the nervous system and sense organs: Secondary | ICD-10-CM

## 2019-07-25 DIAGNOSIS — I10 Essential (primary) hypertension: Secondary | ICD-10-CM

## 2019-07-25 DIAGNOSIS — M545 Low back pain, unspecified: Secondary | ICD-10-CM

## 2019-07-25 MED ORDER — CYCLOBENZAPRINE HCL 5 MG PO TABS: 5.0000 mg | ORAL_TABLET | Freq: Three times a day (TID) | ORAL | 0 refills | Status: DC | PRN

## 2019-07-25 NOTE — ED Provider Notes (Signed)
Virtual Visit via Video Note:  Carolyn Phillips  initiated request for Telemedicine visit with [facility]gent [facility] team. I connected with Carolyn Phillips  on 07/25/2019 at 12:53 PM  for a synchronized telemedicine visit using a video enabled HIPPA compliant telemedicine application. I verified that I am speaking with Carolyn Phillips  using two identifiers. Carolyn Eagles, PA-C  was physically located in a [facility] Urgent care site and Carolyn Phillips was located at a different location.   The limitations of evaluation and management by telemedicine as well as the availability of in-person appointments were discussed. Patient was informed that she  may incur a bill ( including co-pay) for this virtual visit encounter. Carolyn Phillips  expressed understanding and gave verbal consent to proceed with virtual visit.   History of Present Illness:Carolyn Phillips  is a 43 y.o. female presents with 2 week history of left flank/low back pain that radiates down toward lateral left hip. Initially had subjective fever, nausea, general malaise. Pain is mostly constant but now worse with movement and when rising from a sitting position. Patient is now taking HCTZ, she is also a diabetic.  Manages her type 1 diabetes very well.  She is hydrating better and is urinating frequently. Has had sciatica before but this pain is different.  Now denies any fever, nausea, vomiting, dysuria, hematuria, urinary urgency, radiculopathy, weakness, numbness or tingling.    ROS  Current Outpatient Medications  Medication Sig Dispense Refill  . acetaminophen (TYLENOL) 500 MG tablet Take 1,000 mg by mouth every 6 (six) hours as needed for mild pain.    Marland Kitchen albuterol (PROVENTIL HFA;VENTOLIN HFA) 108 (90 Base) MCG/ACT inhaler Inhale 2 puffs into the lungs every 4 (four) hours as needed for wheezing or shortness of breath. Dispense with aerochamber 1 Inhaler 0  . benzonatate (TESSALON) 200 MG  capsule Take 1 capsule (200 mg total) by mouth 2 (two) times daily as needed for cough. 20 capsule 0  . Biotin 2500 MCG CAPS Take 2,500 mcg by mouth daily.     . Blood Glucose Monitoring Suppl (FREESTYLE LITE) DEVI Use to check blood sugar 1 time per day. Dx Code: E11.9 1 each 2  . cetirizine (ZYRTEC) 10 MG tablet Take 10 mg by mouth daily as needed for allergies.     . Cholecalciferol (VITAMIN D3) 3000 UNITS TABS Take 5,000 Units by mouth daily.     . Cyanocobalamin (VITAMIN B 12 PO) Take 1 tablet by mouth daily.    . cyclobenzaprine (FLEXERIL) 10 MG tablet Take 1 tablet (10 mg total) by mouth 2 (two) times daily as needed for muscle spasms. 20 tablet 0  . dextromethorphan-guaiFENesin (MUCINEX DM) 30-600 MG 12hr tablet Take 1 tablet by mouth 2 (two) times daily. 20 tablet 0  . fluticasone (FLONASE) 50 MCG/ACT nasal spray Place 2 sprays into both nostrils daily. 16 g 0  . glucose blood (FREESTYLE LITE) test strip Use to check blood sugar 1 time per day. Dx Code: E11.9 100 each 2  . ibuprofen (ADVIL,MOTRIN) 800 MG tablet Take 1 tablet (800 mg total) by mouth 3 (three) times daily. 21 tablet 0  . Lancets (FREESTYLE) lancets Use to check blood sugar 1 time per day. Dx Code: E11.9 100 each 2  . loratadine (CLARITIN) 10 MG tablet Take 1 tablet (10 mg total) by mouth daily. 30 tablet 11  . meclizine (ANTIVERT) 25 MG tablet TAKE 1 TABLET ( 25MG ) BY MOUTH THREE TIME DAILY  AS NEEDED FOR DIZZINESS    . methocarbamol (ROBAXIN) 500 MG tablet TAKE 1 TABLET (500MG ) BY MOUTH THREE TIME DAILY AS NEEDED FOR MUSCLE SPASM    . Multiple Vitamins-Minerals (ZINC PO) Take 1 tablet by mouth daily.    . nebivolol (BYSTOLIC) 5 MG tablet TAKE 1 TABLET BY MOUTH ONCE DAILY AS NEEDED 90 tablet 1  . repaglinide (PRANDIN) 0.5 MG tablet TAKE 1 TABLET BY MOUTH DAILY WITH SUPPER 90 tablet 3  . rosuvastatin (CRESTOR) 5 MG tablet TAKE 1 TABLET BY MOUTH DAILY 90 tablet 2  . Spacer/Aero-Holding Chambers (AEROCHAMBER PLUS) inhaler Use  as instructed 1 each 2  . VITAMIN A PO Take 1 tablet by mouth daily.     No current facility-administered medications for this encounter.      Allergies  Allergen Reactions  . Penicillins Hives and Swelling    Has patient had a PCN reaction causing immediate rash, facial/tongue/throat swelling, SOB or lightheadedness with hypotension: Yes Has patient had a PCN reaction causing severe rash involving mucus membranes or skin necrosis: No Has patient had a PCN reaction that required hospitalization No Has patient had a PCN reaction occurring within the last 10 years: No If all of the above answers are "NO", then may proceed with Cephalosporin use.   Eye swelling  . Metoprolol     "LOL" Drugs cause hair to fall out  . Nitrofurantoin Macrocrystal Other (See Comments)  . Other Nausea And Vomiting    General Anesthesia      Past Medical History:  Diagnosis Date  . Beta thalassemia trait   . Complication of anesthesia   . Diabetes mellitus without complication (HCC)   . Dysrhythmia    WPW-ablation  . GERD (gastroesophageal reflux disease)   . Headache   . Hyperlipemia   . Hypertension   . Obesity   . PONV (postoperative nausea and vomiting)   . Wolff-Parkinson-White syndrome     Past Surgical History:  Procedure Laterality Date  . ABDOMINAL HYSTERECTOMY    . BLADDER REPAIR    . BREAST REDUCTION SURGERY    . CHONDROPLASTY Left 09/13/2015   Procedure: CHONDROPLASTY;  Surgeon: Gean BirchwoodFrank Rowan, MD;  Location: [facility];  Service: Orthopedics;  Laterality: Left;  . KNEE ARTHROSCOPY WITH DRILLING/MICROFRACTURE Left 09/13/2015   Procedure: KNEE ARTHROSCOPY WITH DRILLING/MICROFRACTURE;  Surgeon: Gean BirchwoodFrank Rowan, MD;  Location: [facility];  Service: Orthopedics;  Laterality: Left;  . REDUCTION MAMMAPLASTY Bilateral 2001  . SUPRAVENTRICULAR TACHYCARDIA ABLATION N/A 07/04/2014   Procedure: WPW ABLATION;  Surgeon: Marinus MawGregg W Taylor, MD;  [facility];   Service: Cardiovascular;  Laterality: N/A;  . TUBAL LIGATION        Observations/Objective: Physical Exam Constitutional:      General: She is not in acute distress.    Appearance: Normal appearance. She is well-developed. She is not ill-appearing, toxic-appearing or diaphoretic.  Eyes:     Extraocular Movements: Extraocular movements intact.  Pulmonary:     Effort: Pulmonary effort is normal.  Neurological:     General: No focal deficit present.     Mental Status: She is alert and oriented to person, place, and time.  Psychiatric:        Mood and Affect: Mood normal.        Behavior: Behavior normal.        Thought Content: Thought content normal.        Judgment: Judgment normal.      Assessment and Plan:  1. Left flank pain   2. Acute left-sided low back pain without sciatica   3. Left hip pain   4. History of sciatica   5. Well controlled type 1 diabetes mellitus (HCC)   6. Essential hypertension    I suspect the patient may be having musculoskeletal symptoms related to hypokalemia given that she is on HCTZ 25 mg daily.  She is not taking any kind of's of potassium supplement.  Symptoms are most consistent with this and therefore recommend that she use Tylenol, Flexeril and eat potassium rich foods.  Emphasized that it would be prudent to have her potassium level checked, urine sample in clinic as soon as she is able to. Counseled patient on potential for adverse effects with medications prescribed/recommended today, ER and return-to-clinic precautions discussed, patient verbalized understanding.    Follow Up Instructions:    I discussed the assessment and treatment plan with the patient. The patient was provided an opportunity to ask questions and all were answered. The patient agreed with the plan and demonstrated an understanding of the instructions.   The patient was advised to call back or seek an in-person evaluation if the symptoms worsen or if the condition  fails to improve as anticipated.  I provided 15 minutes of non-face-to-face time during this encounter.    Wallis Bamberg, PA-C  07/25/2019 12:53 PM         Wallis Bamberg, PA-C 07/25/19 1320

## 2019-07-25 NOTE — Discharge Instructions (Signed)
You may take 500mg-650mg Tylenol every 6 hours for pain and inflammation.  ° ° °

## 2019-07-30 MED FILL — DOXYCYCLINE HYCLATE 100 MG: 100 | 14 days supply | Qty: 28 | Fill #0

## 2019-07-30 MED FILL — METHOCARBAMOL 500 MG TABS: 500 | 13 days supply | Qty: 120 | Fill #0

## 2019-07-30 MED FILL — DICLOFENAC SODIUM 75 MG TAB: 75 | 30 days supply | Qty: 60 | Fill #0

## 2019-08-10 ENCOUNTER — Other Ambulatory Visit: Payer: Self-pay

## 2019-08-10 ENCOUNTER — Telehealth: Payer: Self-pay | Admitting: Endocrinology

## 2019-08-10 DIAGNOSIS — E119 Type 2 diabetes mellitus without complications: Secondary | ICD-10-CM

## 2019-08-10 MED ORDER — REPAGLINIDE 0.5 MG PO TABS: 0.5000 mg | ORAL_TABLET | Freq: Every day | ORAL | 0 refills | Status: DC

## 2019-08-10 MED FILL — REPAGLINIDE 0.5 MG TABLET: 0.5 | 30 days supply | Qty: 30 | Fill #0

## 2019-08-10 MED FILL — JARDIANCE 25 MG TABLET: 25 | 30 days supply | Qty: 30 | Fill #1

## 2019-08-10 NOTE — Telephone Encounter (Signed)
Patient is scheduled 08/25/19 8am

## 2019-08-10 NOTE — Telephone Encounter (Signed)
repaglinide (PRANDIN) 0.5 MG tablet 30 tablet 0 08/10/2019    Sig - Route: Take 1 tablet (0.5 mg total) by mouth daily with supper. - Oral   Sent to pharmacy as: repaglinide (PRANDIN) 0.5 MG tablet   E-Prescribing Status: Receipt confirmed by pharmacy (08/10/2019 10:32 AM EST)

## 2019-08-10 NOTE — Telephone Encounter (Signed)
Patient is scheduled for appointment on 08/25/19 at 8:00 a.m.

## 2019-08-10 NOTE — Telephone Encounter (Signed)
Per Dr. Ellison, unable to refill Repaglinide without an appt. Routing this message to the front desk for scheduling purposes.  

## 2019-08-23 ENCOUNTER — Other Ambulatory Visit: Payer: Self-pay

## 2019-08-25 ENCOUNTER — Other Ambulatory Visit: Payer: Self-pay

## 2019-08-25 ENCOUNTER — Encounter: Payer: Self-pay | Admitting: Endocrinology

## 2019-08-25 ENCOUNTER — Ambulatory Visit: Payer: No Typology Code available for payment source | Admitting: Endocrinology

## 2019-08-25 VITALS — BP 110/70 | HR 66 | Ht 62.0 in | Wt 189.6 lb

## 2019-08-25 DIAGNOSIS — E119 Type 2 diabetes mellitus without complications: Secondary | ICD-10-CM

## 2019-08-25 LAB — POCT GLYCOSYLATED HEMOGLOBIN (HGB A1C): Hemoglobin A1C: 7.2 % — AB (ref 4.0–5.6)

## 2019-08-25 MED ORDER — REPAGLINIDE 0.5 MG PO TABS: 0.5000 mg | ORAL_TABLET | Freq: Two times a day (BID) | ORAL | 3 refills | Status: DC

## 2019-08-25 MED ORDER — JARDIANCE 25 MG PO TABS: 25.0000 mg | ORAL_TABLET | Freq: Every day | ORAL | 3 refills | Status: DC

## 2019-08-25 NOTE — Patient Instructions (Addendum)
I have sent a prescription to your pharmacy, to increase the repaglinice to twice a day (just before 2 meals). Please continue the same Jardiance.  check your blood sugar once a day.  vary the time of day when you check, between before the 3 meals, and at bedtime.  also check if you have symptoms of your blood sugar being too high or too low.  please keep a record of the readings and bring it to your next appointment here (or you can bring the meter itself).  You can write it on any piece of paper.  please call us sooner if your blood sugar goes below 70, or if you have a lot of readings over 200. Please come back for a follow-up appointment in 6 months.

## 2019-08-25 NOTE — Progress Notes (Signed)
Subjective:    Patient ID: Carolyn Phillips, female    DOB: 1976-06-10, 43 y.o.   MRN: [id number]  HPI Pt returns for f/u of diabetes mellitus:  DM type: 2 Dx'ed: 2013 Complications: none.  Therapy: 2 oral meds.   GDM: never.  DKA: never Severe hypoglycemia: never.  Pancreatitis: never.  Other: she has never been on insulin; diarrhea, edema, and vertigo limit oral rx options; She did not tolerate janumet (diarrhea).   Interval history: pt states she feels well in general.  Pt says cbg varies from 102-182.  There is no trend throughout the day.   Past Medical History:  Diagnosis Date  . Beta thalassemia trait   . Complication of anesthesia   . Diabetes mellitus without complication (HCC)   . Dysrhythmia    WPW-ablation  . GERD (gastroesophageal reflux disease)   . Headache   . Hyperlipemia   . Hypertension   . Obesity   . PONV (postoperative nausea and vomiting)   . Wolff-Parkinson-White syndrome     Past Surgical History:  Procedure Laterality Date  . ABDOMINAL HYSTERECTOMY    . BLADDER REPAIR    . BREAST REDUCTION SURGERY    . CHONDROPLASTY Left 09/13/2015   Procedure: CHONDROPLASTY;  Surgeon: Gean Birchwood, MD;  Location: [facility];  Service: Orthopedics;  Laterality: Left;  . KNEE ARTHROSCOPY WITH DRILLING/MICROFRACTURE Left 09/13/2015   Procedure: KNEE ARTHROSCOPY WITH DRILLING/MICROFRACTURE;  Surgeon: Gean Birchwood, MD;  Location: [facility];  Service: Orthopedics;  Laterality: Left;  . REDUCTION MAMMAPLASTY Bilateral 2001  . SUPRAVENTRICULAR TACHYCARDIA ABLATION N/A 07/04/2014   Procedure: WPW ABLATION;  Surgeon: Marinus Maw, MD;  Location: [facility];  Service: Cardiovascular;  Laterality: N/A;  . TUBAL LIGATION      Social History   Socioeconomic History  . Marital status: Legally Separated    Spouse name: Not on file  . Number of children: 2  . Years of education: College  . Highest education level: Not on file   Occupational History  . Occupation: works at [employer], student  Social Needs  . Financial resource strain: Not on file  . Food insecurity    Worry: Not on file    Inability: Not on file  . Transportation needs    Medical: Not on file    Non-medical: Not on file  Tobacco Use  . Smoking status: Never Smoker  . Smokeless tobacco: Never Used  . Tobacco comment: "only smoked for one year al ong time ago "  Substance and Sexual Activity  . Alcohol use: Yes    Alcohol/week: 0.0 standard drinks    Comment: occasional  . Drug use: No  . Sexual activity: Not on file  Lifestyle  . Physical activity    Days per week: Not on file    Minutes per session: Not on file  . Stress: Not on file  Relationships  . Social Musician on phone: Not on file    Gets together: Not on file    Attends religious service: Not on file    Active member of club or organization: Not on file    Attends meetings of clubs or organizations: Not on file    Relationship status: Not on file  . Intimate partner violence    Fear of current or ex partner: Not on file    Emotionally abused: Not on file    Physically abused: Not on file    Forced  sexual activity: Not on file  Other Topics Concern  . Not on file  Social History Narrative   Pt lives with mother, children and husband.   Caffeine use: small coffee per day   1 mountain dew per day    Current Outpatient Medications on File Prior to Visit  Medication Sig Dispense Refill  . acetaminophen (TYLENOL) 500 MG tablet Take 1,000 mg by mouth every 6 (six) hours as needed for mild pain.    Marland Kitchen albuterol (PROVENTIL HFA;VENTOLIN HFA) 108 (90 Base) MCG/ACT inhaler Inhale 2 puffs into the lungs every 4 (four) hours as needed for wheezing or shortness of breath. Dispense with aerochamber 1 Inhaler 0  . benzonatate (TESSALON) 200 MG capsule Take 1 capsule (200 mg total) by mouth 2 (two) times daily as needed for cough. 20 capsule 0  . Biotin 2500 MCG CAPS  Take 2,500 mcg by mouth daily.     . Blood Glucose Monitoring Suppl (FREESTYLE LITE) DEVI Use to check blood sugar 1 time per day. Dx Code: E11.9 1 each 2  . cetirizine (ZYRTEC) 10 MG tablet Take 10 mg by mouth daily as needed for allergies.     . Cholecalciferol (VITAMIN D3) 3000 UNITS TABS Take 5,000 Units by mouth daily.     . Cyanocobalamin (VITAMIN B 12 PO) Take 1 tablet by mouth daily.    . cyclobenzaprine (FLEXERIL) 5 MG tablet Take 1 tablet (5 mg total) by mouth 3 (three) times daily as needed for muscle spasms. 60 tablet 0  . dextromethorphan-guaiFENesin (MUCINEX DM) 30-600 MG 12hr tablet Take 1 tablet by mouth 2 (two) times daily. 20 tablet 0  . fluticasone (FLONASE) 50 MCG/ACT nasal spray Place 2 sprays into both nostrils daily. 16 g 0  . glucose blood (FREESTYLE LITE) test strip Use to check blood sugar 1 time per day. Dx Code: E11.9 100 each 2  . ibuprofen (ADVIL,MOTRIN) 800 MG tablet Take 1 tablet (800 mg total) by mouth 3 (three) times daily. 21 tablet 0  . Lancets (FREESTYLE) lancets Use to check blood sugar 1 time per day. Dx Code: E11.9 100 each 2  . loratadine (CLARITIN) 10 MG tablet Take 1 tablet (10 mg total) by mouth daily. 30 tablet 11  . meclizine (ANTIVERT) 25 MG tablet TAKE 1 TABLET ( 25MG ) BY MOUTH THREE TIME DAILY AS NEEDED FOR DIZZINESS    . methocarbamol (ROBAXIN) 500 MG tablet TAKE 1 TABLET (500MG ) BY MOUTH THREE TIME DAILY AS NEEDED FOR MUSCLE SPASM    . Multiple Vitamins-Minerals (ZINC PO) Take 1 tablet by mouth daily.    . nebivolol (BYSTOLIC) 5 MG tablet TAKE 1 TABLET BY MOUTH ONCE DAILY AS NEEDED 90 tablet 1  . rosuvastatin (CRESTOR) 5 MG tablet TAKE 1 TABLET BY MOUTH DAILY 90 tablet 2  . Spacer/Aero-Holding Chambers (AEROCHAMBER PLUS) inhaler Use as instructed 1 each 2  . VITAMIN A PO Take 1 tablet by mouth daily.     No current facility-administered medications on file prior to visit.     Allergies  Allergen Reactions  . Penicillins Hives and Swelling     Has patient had a PCN reaction causing immediate rash, facial/tongue/throat swelling, SOB or lightheadedness with hypotension: Yes Has patient had a PCN reaction causing severe rash involving mucus membranes or skin necrosis: No Has patient had a PCN reaction that required hospitalization No Has patient had a PCN reaction occurring within the last 10 years: No If all of the above answers are "NO", then may  proceed with Cephalosporin use.   Eye swelling  . Metoprolol     "LOL" Drugs cause hair to fall out  . Nitrofurantoin Macrocrystal Other (See Comments)  . Other Nausea And Vomiting    General Anesthesia     Family History  Problem Relation Age of Onset  . Valvular heart disease Mother        Aortic valve replacement  . Diabetes Mother   . Heart attack Maternal Grandmother        50s  . Stroke Maternal Grandmother   . Diabetes Sister   . Breast cancer Sister        Half Sister  . Diabetes Brother   . Breast cancer Paternal Aunt   . Breast cancer Cousin 35    BP 110/70 (BP Location: Left Arm, Patient Position: Sitting, Cuff Size: Normal)   Pulse 66   Ht 5\' 2"  (1.575 m)   Wt 189 lb 9.6 oz (86 kg)   SpO2 98%   BMI 34.68 kg/m    Review of Systems She denies hypoglycemia.  She has lost weight, due to her efforts.      Objective:   Physical Exam VITAL SIGNS:  See vs page GENERAL: no distress Pulses: dorsalis pedis intact bilat.   MSK: no deformity of the feet CV: no leg edema Skin:  no ulcer on the feet.  normal color and temp on the feet. Neuro: sensation is intact to touch on the feet.    Lab Results  Component Value Date   HGBA1C 7.2 (A) 08/25/2019      Assessment & Plan:  Type 2 DM: She would benefit from increased rx, if it can be done with a regimen that avoids or minimizes hypoglycemia.    Patient Instructions  I have sent a prescription to your pharmacy, to increase the repaglinice to twice a day (just before 2 meals). Please continue the same  Jardiance.  check your blood sugar once a day.  vary the time of day when you check, between before the 3 meals, and at bedtime.  also check if you have symptoms of your blood sugar being too high or too low.  please keep a record of the readings and bring it to your next appointment here (or you can bring the meter itself).  You can write it on any piece of paper.  please call us sooner if your blood sugar goes below 70, or if you have a lot of readings over 200. Please come back for a follow-up appointment in 6 months.

## 2019-09-14 MED FILL — ROSUVASTATIN CALCIUM 5 MG T: 5 | 90 days supply | Qty: 90 | Fill #2

## 2019-09-14 MED FILL — BYSTOLIC 5 MG TABLET: 5 | 90 days supply | Qty: 90 | Fill #1

## 2019-09-14 MED FILL — REPAGLINIDE 0.5 MG TABLET: 0.5 | 90 days supply | Qty: 180 | Fill #0

## 2019-09-14 MED FILL — PEG-3350 SOLUTION: 420 | 1 days supply | Qty: 4000 | Fill #0

## 2019-09-14 MED FILL — JARDIANCE 25 MG TABLET: 25 | 30 days supply | Qty: 30 | Fill #2

## 2019-10-08 MED FILL — CEPHALEXIN 500 MG CAPSULE: 500 | 5 days supply | Qty: 10 | Fill #0

## 2019-10-19 ENCOUNTER — Telehealth: Payer: Self-pay | Admitting: Nurse Practitioner

## 2019-10-19 NOTE — Telephone Encounter (Signed)
Called to Discuss with patient about Covid symptoms and the use of bamlanivimab, a monoclonal antibody infusion for those with mild to moderate Covid symptoms and at a high risk of hospitalization.     Pt is qualified for this infusion at the [facility] infusion center due to co-morbid conditions and/or a member of an at-risk group.     Unable to reach pt  

## 2019-10-20 ENCOUNTER — Other Ambulatory Visit: Payer: Self-pay | Admitting: Nurse Practitioner

## 2019-10-20 DIAGNOSIS — E119 Type 2 diabetes mellitus without complications: Secondary | ICD-10-CM

## 2019-10-20 DIAGNOSIS — U071 COVID-19: Secondary | ICD-10-CM

## 2019-10-20 MED FILL — levoFLOXacin 500 MG TABS: 500 | 5 days supply | Qty: 5 | Fill #0

## 2019-10-20 MED FILL — ONDANSETRON ODT 4 MG TABLET: 4 | 7 days supply | Qty: 15 | Fill #0

## 2019-10-20 MED FILL — ALPRAZolam 0.25 MG TABS: 0.25 | 7 days supply | Qty: 15 | Fill #0

## 2019-10-20 NOTE — Progress Notes (Signed)
  I connected by phone with Carolyn Phillips on 10/20/2019 at 7:30 AM to discuss the potential use of an new treatment for mild to moderate COVID-19 viral infection in non-hospitalized patients.  This patient is a 44 y.o. female that meets the FDA criteria for Emergency Use Authorization of bamlanivimab or casirivimab\imdevimab.  Has a (+) direct SARS-CoV-2 viral test result  Has mild or moderate COVID-19   Is ? 45 years of age and weighs ? 40 kg  Is NOT hospitalized due to COVID-19  Is NOT requiring oxygen therapy or requiring an increase in baseline oxygen flow rate due to COVID-19  Is within 10 days of symptom onset  Has at least one of the high risk factor(s) for progression to severe COVID-19 and/or hospitalization as defined in EUA.  Specific high risk criteria : Diabetes   I have spoken and communicated the following to the patient or parent/caregiver:  1. FDA has authorized the emergency use of bamlanivimab and casirivimab\imdevimab for the treatment of mild to moderate COVID-19 in adults and pediatric patients with positive results of direct SARS-CoV-2 viral testing who are 31 years of age and older weighing at least 40 kg, and who are at high risk for progressing to severe COVID-19 and/or hospitalization.  2. The significant known and potential risks and benefits of bamlanivimab and casirivimab\imdevimab, and the extent to which such potential risks and benefits are unknown.  3. Information on available alternative treatments and the risks and benefits of those alternatives, including clinical trials.  4. Patients treated with bamlanivimab and casirivimab\imdevimab should continue to self-isolate and use infection control measures (e.g., wear mask, isolate, social distance, avoid sharing personal items, clean and disinfect "high touch" surfaces, and frequent handwashing) according to CDC guidelines.   5. The patient or parent/caregiver has the option to accept or refuse  bamlanivimab or casirivimab\imdevimab .  After reviewing this information with the patient, The patient agreed to proceed with receiving the bamlanimivab infusion and will be provided a copy of the Fact sheet prior to receiving the infusion.Carolyn Phillips 10/20/2019 7:30 AM

## 2019-10-21 MED FILL — METHOCARBAMOL 500 MG TABS: 500 | 13 days supply | Qty: 120 | Fill #1

## 2019-10-22 ENCOUNTER — Ambulatory Visit (HOSPITAL_COMMUNITY)
Admission: RE | Admit: 2019-10-22 | Discharge: 2019-10-22 | Disposition: A | Discharge status: Home / Self Care | Payer: No Typology Code available for payment source | Source: Ambulatory Visit | Attending: Pulmonary Disease | Admitting: Pulmonary Disease

## 2019-10-22 DIAGNOSIS — E119 Type 2 diabetes mellitus without complications: Secondary | ICD-10-CM | POA: Insufficient documentation

## 2019-10-22 DIAGNOSIS — U071 COVID-19: Secondary | ICD-10-CM | POA: Insufficient documentation

## 2019-10-22 DIAGNOSIS — Z23 Encounter for immunization: Secondary | ICD-10-CM | POA: Insufficient documentation

## 2019-10-22 MED ORDER — DIPHENHYDRAMINE HCL 50 MG/ML IJ SOLN: 50.0000 mg | Freq: Once | INTRAMUSCULAR | Status: DC | PRN

## 2019-10-22 MED ORDER — SODIUM CHLORIDE 0.9 % IV SOLN
INTRAVENOUS | Status: DC | PRN
  Administered 2019-10-22: 250 mL via INTRAVENOUS

## 2019-10-22 MED ORDER — EPINEPHRINE 0.3 MG/0.3ML IJ SOAJ: 0.3000 mg | Freq: Once | INTRAMUSCULAR | Status: DC | PRN

## 2019-10-22 MED ORDER — SODIUM CHLORIDE 0.9 % IV SOLN
700.0000 mg | Freq: Once | INTRAVENOUS | Status: AC
  Administered 2019-10-22: 700 mg via INTRAVENOUS
  Filled 2019-10-22: qty 20

## 2019-10-22 MED ORDER — ALBUTEROL SULFATE HFA 108 (90 BASE) MCG/ACT IN AERS: 2.0000 | INHALATION_SPRAY | Freq: Once | RESPIRATORY_TRACT | Status: DC | PRN

## 2019-10-22 MED ORDER — METHYLPREDNISOLONE SODIUM SUCC 125 MG IJ SOLR: 125.0000 mg | Freq: Once | INTRAMUSCULAR | Status: DC | PRN

## 2019-10-22 MED ORDER — FAMOTIDINE IN NACL 20-0.9 MG/50ML-% IV SOLN: 20.0000 mg | Freq: Once | INTRAVENOUS | Status: DC | PRN

## 2019-10-22 NOTE — Progress Notes (Signed)
  Diagnosis: COVID-19  Physician: Dr. Wright  Procedure: Covid Infusion Clinic Med: bamlanivimab infusion - Provided patient with bamlanimivab fact sheet for patients, parents and caregivers prior to infusion.  Complications: No immediate complications noted.  Discharge: Discharged home   Ciarra Braddy R Emmajo Bennette 10/22/2019   

## 2019-10-22 NOTE — Discharge Instructions (Signed)

## 2019-10-25 MED FILL — levoFLOXacin 500 MG TABS: 500 | 7 days supply | Qty: 7 | Fill #0

## 2019-10-25 MED FILL — ALBUTEROL SULFATE HFA 108 (: 108 (90 BAS | 17 days supply | Qty: 9 | Fill #0

## 2019-10-25 MED FILL — HYDROCODON-APAP 5-325: 5-325 | 5 days supply | Qty: 40 | Fill #0

## 2019-10-25 MED FILL — FLOVENT HFA 110 MCG INHALER: 110 | 30 days supply | Qty: 12 | Fill #0

## 2019-10-26 ENCOUNTER — Other Ambulatory Visit: Payer: Self-pay | Admitting: Internal Medicine

## 2019-10-26 ENCOUNTER — Other Ambulatory Visit (HOSPITAL_COMMUNITY): Payer: Self-pay | Admitting: Internal Medicine

## 2019-10-26 DIAGNOSIS — R29898 Other symptoms and signs involving the musculoskeletal system: Secondary | ICD-10-CM

## 2019-10-26 DIAGNOSIS — M5416 Radiculopathy, lumbar region: Secondary | ICD-10-CM

## 2019-10-28 ENCOUNTER — Ambulatory Visit (HOSPITAL_COMMUNITY)
Admission: RE | Admit: 2019-10-28 | Discharge: 2019-10-28 | Disposition: A | Discharge status: Home / Self Care | Payer: No Typology Code available for payment source | Source: Ambulatory Visit | Attending: Internal Medicine | Admitting: Internal Medicine

## 2019-10-28 ENCOUNTER — Other Ambulatory Visit: Payer: Self-pay

## 2019-10-28 DIAGNOSIS — R29898 Other symptoms and signs involving the musculoskeletal system: Secondary | ICD-10-CM | POA: Insufficient documentation

## 2019-10-28 DIAGNOSIS — M5416 Radiculopathy, lumbar region: Secondary | ICD-10-CM | POA: Insufficient documentation

## 2019-10-28 MED ORDER — GADOBUTROL 1 MMOL/ML IV SOLN
7.5000 mL | Freq: Once | INTRAVENOUS | Status: AC | PRN
  Administered 2019-10-28: 7.5 mL via INTRAVENOUS

## 2019-10-29 MED FILL — MELOXICAM 7.5 MG TABLET: 7.5 | 30 days supply | Qty: 60 | Fill #0

## 2019-10-29 MED FILL — tiZANidine HCL 4 MG TABS: 4 | 15 days supply | Qty: 45 | Fill #0

## 2019-11-04 MED FILL — HYDROCHLOROTHIAZIDE 25 MG T: 25 | 90 days supply | Qty: 90 | Fill #1

## 2019-11-04 MED FILL — JARDIANCE 25 MG TABLET: 25 | 30 days supply | Qty: 30 | Fill #3

## 2019-12-07 MED FILL — tiZANidine HCL 4 MG TABS: 4 | 15 days supply | Qty: 45 | Fill #1

## 2019-12-07 MED FILL — JARDIANCE 25 MG TABLET: 25 | 90 days supply | Qty: 90 | Fill #4

## 2020-01-04 ENCOUNTER — Other Ambulatory Visit (HOSPITAL_COMMUNITY): Payer: Self-pay | Admitting: Internal Medicine

## 2020-01-04 MED FILL — tiZANidine HCL 4 MG TABS: 4 | 30 days supply | Qty: 90 | Fill #0

## 2020-01-04 MED FILL — LEVOCETIRIZINE 5 MG TABLET: 5 | 90 days supply | Qty: 90 | Fill #0

## 2020-01-04 MED FILL — BYSTOLIC 10 MG TABLET: 10 | 90 days supply | Qty: 90 | Fill #0

## 2020-01-15 ENCOUNTER — Ambulatory Visit: Payer: No Typology Code available for payment source | Attending: Internal Medicine

## 2020-01-15 DIAGNOSIS — Z23 Encounter for immunization: Secondary | ICD-10-CM

## 2020-01-15 NOTE — Progress Notes (Signed)
   Covid-19 Vaccination Clinic  Name:  Carolyn Phillips    MRN: [id number] DOB: [birth year]  01/15/2020  Carolyn Phillips was observed post Covid-19 immunization for 15 minutes without incident. She was provided with Vaccine Information Sheet and instruction to access the V-Safe system.   Carolyn Phillips was instructed to call 911 with any severe reactions post vaccine: Marland Kitchen Difficulty breathing  . Swelling of face and throat  . A fast heartbeat  . A bad rash all over body  . Dizziness and weakness   Immunizations Administered    Name Date Dose VIS Date Route   Pfizer COVID-19 Vaccine 01/15/2020  8:59 AM 0.3 mL 09/10/2019 Intramuscular   Manufacturer: [employer], [employer]   Lot: [id number]   NDC: [address]-7

## 2020-02-08 ENCOUNTER — Ambulatory Visit: Payer: No Typology Code available for payment source | Attending: Internal Medicine

## 2020-02-08 DIAGNOSIS — Z23 Encounter for immunization: Secondary | ICD-10-CM

## 2020-02-08 NOTE — Progress Notes (Signed)
   Covid-19 Vaccination Clinic  Name:  Carolyn Phillips    MRN: [id number] DOB: 1976-08-22  02/08/2020  [name] was observed post Covid-19 immunization for 15 minutes without incident. She was provided with Vaccine Information Sheet and instruction to access the V-Safe system.   [name] was instructed to call 911 with any severe reactions post vaccine: Marland Kitchen Difficulty breathing  . Swelling of face and throat  . A fast heartbeat  . A bad rash all over body  . Dizziness and weakness   Immunizations Administered    Name Date Dose VIS Date Route   Pfizer COVID-19 Vaccine 02/08/2020  8:59 AM 0.3 mL 11/24/2018 Intramuscular   Manufacturer: [employer], [employer]   Lot: [id number]   NDC: [address]-7

## 2020-02-18 ENCOUNTER — Other Ambulatory Visit: Payer: Self-pay

## 2020-02-22 ENCOUNTER — Encounter: Payer: Self-pay | Admitting: Endocrinology

## 2020-02-22 ENCOUNTER — Other Ambulatory Visit: Payer: Self-pay

## 2020-02-22 ENCOUNTER — Ambulatory Visit: Payer: No Typology Code available for payment source | Admitting: Endocrinology

## 2020-02-22 VITALS — BP 124/74 | HR 73 | Ht 62.0 in | Wt 184.0 lb

## 2020-02-22 DIAGNOSIS — E119 Type 2 diabetes mellitus without complications: Secondary | ICD-10-CM | POA: Diagnosis not present

## 2020-02-22 LAB — POCT GLYCOSYLATED HEMOGLOBIN (HGB A1C): Hemoglobin A1C: 7.3 % — AB (ref 4.0–5.6)

## 2020-02-22 MED ORDER — RYBELSUS 3 MG PO TABS: 3.0000 mg | ORAL_TABLET | Freq: Every day | ORAL | 3 refills | Status: DC

## 2020-02-22 MED FILL — RYBELSUS 3 MG TABS: 3 | 90 days supply | Qty: 90 | Fill #0

## 2020-02-22 NOTE — Progress Notes (Signed)
Subjective:    Patient ID: Carolyn Phillips, female    DOB: 09/09/1976, 44 y.o.   MRN: [id number]  HPI Pt returns for f/u of diabetes mellitus:  DM type: 2 Dx'ed: 2013 Complications: none.  Therapy: 2 oral meds.   GDM: never.  DKA: never Severe hypoglycemia: never.  Pancreatitis: never.  Other: she has never been on insulin; diarrhea, edema, and vertigo limit oral rx options; She did not tolerate janumet (diarrhea).   Interval history: pt states she feels well in general.  Pt says cbg varies from 91-192.  She takes meds as rx'ed Past Medical History:  Diagnosis Date  . Beta thalassemia trait   . Complication of anesthesia   . Diabetes mellitus without complication (HCC)   . Dysrhythmia    WPW-ablation  . GERD (gastroesophageal reflux disease)   . Headache   . Hyperlipemia   . Hypertension   . Obesity   . PONV (postoperative nausea and vomiting)   . Wolff-Parkinson-White syndrome     Past Surgical History:  Procedure Laterality Date  . ABDOMINAL HYSTERECTOMY    . BLADDER REPAIR    . BREAST REDUCTION SURGERY    . CHONDROPLASTY Left 09/13/2015   Procedure: CHONDROPLASTY;  Surgeon: Gean Birchwood, MD;  Location: [facility];  Service: Orthopedics;  Laterality: Left;  . KNEE ARTHROSCOPY WITH DRILLING/MICROFRACTURE Left 09/13/2015   Procedure: KNEE ARTHROSCOPY WITH DRILLING/MICROFRACTURE;  Surgeon: Gean Birchwood, MD;  Location: [facility];  Service: Orthopedics;  Laterality: Left;  . REDUCTION MAMMAPLASTY Bilateral 2001  . SUPRAVENTRICULAR TACHYCARDIA ABLATION N/A 07/04/2014   Procedure: WPW ABLATION;  Surgeon: Marinus Maw, MD;  Location: [facility] CATH LAB;  Service: Cardiovascular;  Laterality: N/A;  . TUBAL LIGATION      Social History   Socioeconomic History  . Marital status: Legally Separated    Spouse name: Not on file  . Number of children: 2  . Years of education: College  . Highest education level: Not on file  Occupational  History  . Occupation: works at [employer], student  Tobacco Use  . Smoking status: Never Smoker  . Smokeless tobacco: Never Used  . Tobacco comment: "only smoked for one year al ong time ago "  Substance and Sexual Activity  . Alcohol use: Yes    Alcohol/week: 0.0 standard drinks    Comment: occasional  . Drug use: No  . Sexual activity: Not on file  Other Topics Concern  . Not on file  Social History Narrative   Pt lives with mother, children and husband.   Caffeine use: small coffee per day   1 mountain dew per day   Social Determinants of Health   Financial Resource Strain:   . Difficulty of Paying Living Expenses:   Food Insecurity:   . Worried About Programme researcher, broadcasting/film/video in the Last Year:   . Barista in the Last Year:   Transportation Needs:   . Freight forwarder (Medical):   Marland Kitchen Lack of Transportation (Non-Medical):   Physical Activity:   . Days of Exercise per Week:   . Minutes of Exercise per Session:   Stress:   . Feeling of Stress :   Social Connections:   . Frequency of Communication with Friends and Family:   . Frequency of Social Gatherings with Friends and Family:   . Attends Religious Services:   . Active Member of Clubs or Organizations:   . Attends Banker Meetings:   .  Marital Status:   Intimate Partner Violence:   . Fear of Current or Ex-Partner:   . Emotionally Abused:   Marland Kitchen Physically Abused:   . Sexually Abused:     Current Outpatient Medications on File Prior to Visit  Medication Sig Dispense Refill  . acetaminophen (TYLENOL) 500 MG tablet Take 1,000 mg by mouth every 6 (six) hours as needed for mild pain.    Marland Kitchen albuterol (PROVENTIL HFA;VENTOLIN HFA) 108 (90 Base) MCG/ACT inhaler Inhale 2 puffs into the lungs every 4 (four) hours as needed for wheezing or shortness of breath. Dispense with aerochamber 1 Inhaler 0  . benzonatate (TESSALON) 200 MG capsule Take 1 capsule (200 mg total) by mouth 2 (two) times daily as needed  for cough. 20 capsule 0  . Biotin 2500 MCG CAPS Take 2,500 mcg by mouth daily.     . Blood Glucose Monitoring Suppl (FREESTYLE LITE) DEVI Use to check blood sugar 1 time per day. Dx Code: E11.9 1 each 2  . cetirizine (ZYRTEC) 10 MG tablet Take 10 mg by mouth daily as needed for allergies.     . Cholecalciferol (VITAMIN D3) 3000 UNITS TABS Take 5,000 Units by mouth daily.     . Cyanocobalamin (VITAMIN B 12 PO) Take 1 tablet by mouth daily.    . cyclobenzaprine (FLEXERIL) 5 MG tablet Take 1 tablet (5 mg total) by mouth 3 (three) times daily as needed for muscle spasms. 60 tablet 0  . dextromethorphan-guaiFENesin (MUCINEX DM) 30-600 MG 12hr tablet Take 1 tablet by mouth 2 (two) times daily. 20 tablet 0  . empagliflozin (JARDIANCE) 25 MG TABS tablet Take 25 mg by mouth daily. 90 tablet 3  . fluticasone (FLONASE) 50 MCG/ACT nasal spray Place 2 sprays into both nostrils daily. 16 g 0  . glucose blood (FREESTYLE LITE) test strip Use to check blood sugar 1 time per day. Dx Code: E11.9 100 each 2  . ibuprofen (ADVIL,MOTRIN) 800 MG tablet Take 1 tablet (800 mg total) by mouth 3 (three) times daily. 21 tablet 0  . Lancets (FREESTYLE) lancets Use to check blood sugar 1 time per day. Dx Code: E11.9 100 each 2  . loratadine (CLARITIN) 10 MG tablet Take 1 tablet (10 mg total) by mouth daily. 30 tablet 11  . meclizine (ANTIVERT) 25 MG tablet TAKE 1 TABLET ( 25MG ) BY MOUTH THREE TIME DAILY AS NEEDED FOR DIZZINESS    . methocarbamol (ROBAXIN) 500 MG tablet TAKE 1 TABLET (500MG ) BY MOUTH THREE TIME DAILY AS NEEDED FOR MUSCLE SPASM    . Multiple Vitamins-Minerals (ZINC PO) Take 1 tablet by mouth daily.    . nebivolol (BYSTOLIC) 5 MG tablet TAKE 1 TABLET BY MOUTH ONCE DAILY AS NEEDED 90 tablet 1  . repaglinide (PRANDIN) 0.5 MG tablet Take 1 tablet (0.5 mg total) by mouth 2 (two) times daily before a meal. 180 tablet 3  . rosuvastatin (CRESTOR) 5 MG tablet TAKE 1 TABLET BY MOUTH DAILY 90 tablet 2  .  Spacer/Aero-Holding Chambers (AEROCHAMBER PLUS) inhaler Use as instructed 1 each 2  . VITAMIN A PO Take 1 tablet by mouth daily.     No current facility-administered medications on file prior to visit.    Allergies  Allergen Reactions  . Penicillins Hives and Swelling    Has patient had a PCN reaction causing immediate rash, facial/tongue/throat swelling, SOB or lightheadedness with hypotension: Yes Has patient had a PCN reaction causing severe rash involving mucus membranes or skin necrosis: No Has patient had  a PCN reaction that required hospitalization No Has patient had a PCN reaction occurring within the last 10 years: No If all of the above answers are "NO", then may proceed with Cephalosporin use.   Eye swelling  . Metoprolol     "LOL" Drugs cause hair to fall out  . Nitrofurantoin Macrocrystal Other (See Comments)  . Other Nausea And Vomiting    General Anesthesia     Family History  Problem Relation Age of Onset  . Valvular heart disease Mother        Aortic valve replacement  . Diabetes Mother   . Heart attack Maternal Grandmother        50s  . Stroke Maternal Grandmother   . Diabetes Sister   . Breast cancer Sister        Half Sister  . Diabetes Brother   . Breast cancer Paternal Aunt   . Breast cancer Cousin 35    BP 124/74   Pulse 73   Ht 5\' 2"  (1.575 m)   Wt 184 lb (83.5 kg)   SpO2 97%   BMI 33.65 kg/m    Review of Systems She denies hypoglycemia.      Objective:   Physical Exam VITAL SIGNS:  See vs page GENERAL: no distress Pulses: dorsalis pedis intact bilat.   MSK: no deformity of the feet CV: no leg edema Skin:  no ulcer on the feet.  normal color and temp on the feet. Neuro: sensation is intact to touch on the feet  A1c=7.3%     Assessment & Plan:  Type 2 DM: worse.   Patient Instructions  I have sent a prescription to your pharmacy, to add "Rybelsus." Please continue the same other diabetes meds check your blood sugar once a  day.  vary the time of day when you check, between before the 3 meals, and at bedtime.  also check if you have symptoms of your blood sugar being too high or too low.  please keep a record of the readings and bring it to your next appointment here (or you can bring the meter itself).  You can write it on any piece of paper.  please call sooner if your blood sugar goes below 70, or if you have a lot of readings over 200. Please come back for a follow-up appointment in 6 months.

## 2020-02-22 NOTE — Patient Instructions (Addendum)
I have sent a prescription to your pharmacy, to add "Rybelsus." Please continue the same other diabetes meds check your blood sugar once a day.  vary the time of day when you check, between before the 3 meals, and at bedtime.  also check if you have symptoms of your blood sugar being too high or too low.  please keep a record of the readings and bring it to your next appointment here (or you can bring the meter itself).  You can write it on any piece of paper.  please call us sooner if your blood sugar goes below 70, or if you have a lot of readings over 200. Please come back for a follow-up appointment in 6 months.

## 2020-03-15 MED FILL — HYDROCHLOROTHIAZIDE 25 MG T: 25 | 90 days supply | Qty: 90 | Fill #2

## 2020-03-31 ENCOUNTER — Other Ambulatory Visit: Payer: Self-pay | Admitting: Internal Medicine

## 2020-03-31 MED FILL — REPAGLINIDE 0.5 MG TABLET: 0.5 | 90 days supply | Qty: 180 | Fill #1

## 2020-03-31 MED FILL — ROSUVASTATIN CALCIUM 5 MG T: 5 | 15 days supply | Qty: 15 | Fill #0

## 2020-05-11 MED FILL — JARDIANCE 25 MG TABLET: 25 | 60 days supply | Qty: 60 | Fill #5

## 2020-05-11 MED FILL — tiZANidine HCL 4 MG TABS: 4 | 30 days supply | Qty: 90 | Fill #1

## 2020-06-07 ENCOUNTER — Other Ambulatory Visit (HOSPITAL_COMMUNITY): Payer: Self-pay | Admitting: Internal Medicine

## 2020-06-07 MED FILL — AZITHROMYCIN 250 MG TABS: 250 | 5 days supply | Qty: 6 | Fill #0

## 2020-06-07 MED FILL — ONDANSETRON ODT 4 MG TABLET: 4 | 15 days supply | Qty: 30 | Fill #0

## 2020-06-07 MED FILL — ROSUVASTATIN CALCIUM 5 MG T: 5 | 90 days supply | Qty: 90 | Fill #0

## 2020-06-23 ENCOUNTER — Other Ambulatory Visit (HOSPITAL_COMMUNITY): Payer: Self-pay | Admitting: Registered Nurse

## 2020-06-23 MED FILL — ALBUTEROL SULFATE HFA 108 (: 108 (90 BAS | 17 days supply | Qty: 9 | Fill #0

## 2020-06-23 MED FILL — FLOVENT HFA 110 MCG INHALER: 110 | 30 days supply | Qty: 12 | Fill #0

## 2020-06-23 MED FILL — MONTELUKAST SOD 10 MG TAB: 10 | 30 days supply | Qty: 30 | Fill #0

## 2020-06-23 MED FILL — predniSONE 5 MG TABS: 5 | 6 days supply | Qty: 21 | Fill #0

## 2020-07-13 MED FILL — LEVOCETIRIZINE 5 MG TABLET: 5 | 90 days supply | Qty: 90 | Fill #1

## 2020-07-25 ENCOUNTER — Other Ambulatory Visit (HOSPITAL_COMMUNITY): Payer: Self-pay | Admitting: Internal Medicine

## 2020-07-25 MED FILL — HYDROCHLOROTHIAZIDE 25 MG T: 25 | 90 days supply | Qty: 90 | Fill #0

## 2020-07-25 MED FILL — NEBIVOLOL HCL 10 MG TABS: 10 | 90 days supply | Qty: 90 | Fill #1

## 2020-07-25 MED FILL — MONTELUKAST SOD 10 MG TAB: 10 | 30 days supply | Qty: 30 | Fill #1

## 2020-07-27 ENCOUNTER — Other Ambulatory Visit (HOSPITAL_COMMUNITY): Payer: Self-pay | Admitting: Internal Medicine

## 2020-07-27 MED FILL — JARDIANCE 25 MG TABLET: 25 | 90 days supply | Qty: 90 | Fill #0

## 2020-08-21 ENCOUNTER — Other Ambulatory Visit (HOSPITAL_COMMUNITY): Payer: Self-pay | Admitting: Dermatology

## 2020-08-21 MED FILL — DOXYCYCLINE HYCLATE 100 MG: 100 | 30 days supply | Qty: 60 | Fill #0

## 2020-08-29 ENCOUNTER — Other Ambulatory Visit: Payer: Self-pay | Admitting: Endocrinology

## 2020-08-29 DIAGNOSIS — E119 Type 2 diabetes mellitus without complications: Secondary | ICD-10-CM

## 2020-08-29 MED FILL — REPAGLINIDE 0.5 MG TABLET: 0.5 | 90 days supply | Qty: 180 | Fill #0

## 2020-08-30 ENCOUNTER — Ambulatory Visit: Payer: No Typology Code available for payment source | Admitting: Endocrinology

## 2020-08-30 ENCOUNTER — Encounter: Payer: Self-pay | Admitting: Endocrinology

## 2020-08-30 ENCOUNTER — Other Ambulatory Visit: Payer: Self-pay | Admitting: Endocrinology

## 2020-08-30 ENCOUNTER — Other Ambulatory Visit: Payer: Self-pay

## 2020-08-30 VITALS — BP 138/90 | HR 78 | Ht 62.0 in | Wt 190.0 lb

## 2020-08-30 DIAGNOSIS — E119 Type 2 diabetes mellitus without complications: Secondary | ICD-10-CM

## 2020-08-30 LAB — POCT GLYCOSYLATED HEMOGLOBIN (HGB A1C): Hemoglobin A1C: 7.1 % — AB (ref 4.0–5.6)

## 2020-08-30 MED ORDER — RYBELSUS 7 MG PO TABS: 7.0000 mg | ORAL_TABLET | Freq: Every day | ORAL | 3 refills | Status: DC

## 2020-08-30 MED FILL — RYBELSUS 7 MG TABS: 7 | 90 days supply | Qty: 90 | Fill #0

## 2020-08-30 NOTE — Patient Instructions (Addendum)
I have sent a prescription to your pharmacy, to increase the Rybelsus.  Please continue the same other diabetes meds.   check your blood sugar once a day.  vary the time of day when you check, between before the 3 meals, and at bedtime.  also check if you have symptoms of your blood sugar being too high or too low.  please keep a record of the readings and bring it to your next appointment here (or you can bring the meter itself).  You can write it on any piece of paper.  please call us sooner if your blood sugar goes below 70, or if you have a lot of readings over 200. Please come back for a follow-up appointment in 6 months.

## 2020-08-30 NOTE — Progress Notes (Signed)
Subjective:    Patient ID: Carolyn Phillips, female    DOB: Oct 30, 1975, 44 y.o.   MRN: [id number]  HPI Pt returns for f/u of diabetes mellitus:  DM type: 2 Dx'ed: 2013 Complications: none.  Therapy: 3 oral meds.   GDM: never.  DKA: never Severe hypoglycemia: never.  Pancreatitis: never.  Other: she has never been on insulin; diarrhea, edema, and vertigo limit oral rx options; She did not tolerate janumet (diarrhea).   Interval history: pt states she feels well in general.  Pt says cbg's are in the 100's.  She takes meds as rx'ed.   Past Medical History:  Diagnosis Date  . Beta thalassemia trait   . Complication of anesthesia   . Diabetes mellitus without complication (HCC)   . Dysrhythmia    WPW-ablation  . GERD (gastroesophageal reflux disease)   . Headache   . Hyperlipemia   . Hypertension   . Obesity   . PONV (postoperative nausea and vomiting)   . Wolff-Parkinson-White syndrome     Past Surgical History:  Procedure Laterality Date  . ABDOMINAL HYSTERECTOMY    . BLADDER REPAIR    . BREAST REDUCTION SURGERY    . CHONDROPLASTY Left 09/13/2015   Procedure: CHONDROPLASTY;  Surgeon: Gean Birchwood, MD;  Location: [facility];  Service: Orthopedics;  Laterality: Left;  . KNEE ARTHROSCOPY WITH DRILLING/MICROFRACTURE Left 09/13/2015   Procedure: KNEE ARTHROSCOPY WITH DRILLING/MICROFRACTURE;  Surgeon: Gean Birchwood, MD;  Location:  SUR[facility]ERY CENTER;  Service: Orthopedics;  Laterality: Left;  . REDUCTION MAMMAPLASTY Bilateral 2001  . SUPRAVENTRICULAR TACHYCARDIA ABLATION N/A 07/04/2014   Procedure: WPW ABLATION;  Surgeon: Marinus Maw, MD;  Location: [facility] CATH LAB;  Service: Cardiovascular;  Laterality: N/A;  . TUBAL LIGATION      Social History   Socioeconomic History  . Marital status: Legally Separated    Spouse name: Not on file  . Number of children: 2  . Years of education: College  . Highest education level: Not on file  Occupational  History  . Occupation: works at [employer], student  Tobacco Use  . Smoking status: Never Smoker  . Smokeless tobacco: Never Used  . Tobacco comment: "only smoked for one year al ong time ago "  Substance and Sexual Activity  . Alcohol use: Yes    Alcohol/week: 0.0 standard drinks    Comment: occasional  . Drug use: No  . Sexual activity: Not on file  Other Topics Concern  . Not on file  Social History Narrative   Pt lives with mother, children and husband.   Caffeine use: small coffee per day   1 mountain dew per day   Social Determinants of Health   Financial Resource Strain:   . Difficulty of Paying Living Expenses: Not on file  Food Insecurity:   . Worried About Programme researcher, broadcasting/film/video in the Last Year: Not on file  . Ran Out of Food in the Last Year: Not on file  Transportation Needs:   . Lack of Transportation (Medical): Not on file  . Lack of Transportation (Non-Medical): Not on file  Physical Activity:   . Days of Exercise per Week: Not on file  . Minutes of Exercise per Session: Not on file  Stress:   . Feeling of Stress : Not on file  Social Connections:   . Frequency of Communication with Friends and Family: Not on file  . Frequency of Social Gatherings with Friends and Family: Not on file  .  Attends Religious Services: Not on file  . Active Member of Clubs or Organizations: Not on file  . Attends Banker Meetings: Not on file  . Marital Status: Not on file  Intimate Partner Violence:   . Fear of Current or Ex-Partner: Not on file  . Emotionally Abused: Not on file  . Physically Abused: Not on file  . Sexually Abused: Not on file    Current Outpatient Medications on File Prior to Visit  Medication Sig Dispense Refill  . acetaminophen (TYLENOL) 500 MG tablet Take 1,000 mg by mouth every 6 (six) hours as needed for mild pain.    Marland Kitchen albuterol (PROVENTIL HFA;VENTOLIN HFA) 108 (90 Base) MCG/ACT inhaler Inhale 2 puffs into the lungs every 4 (four)  hours as needed for wheezing or shortness of breath. Dispense with aerochamber 1 Inhaler 0  . benzonatate (TESSALON) 200 MG capsule Take 1 capsule (200 mg total) by mouth 2 (two) times daily as needed for cough. 20 capsule 0  . Biotin 2500 MCG CAPS Take 2,500 mcg by mouth daily.     . Blood Glucose Monitoring Suppl (FREESTYLE LITE) DEVI Use to check blood sugar 1 time per day. Dx Code: E11.9 1 each 2  . cetirizine (ZYRTEC) 10 MG tablet Take 10 mg by mouth daily as needed for allergies.     . Cholecalciferol (VITAMIN D3) 3000 UNITS TABS Take 5,000 Units by mouth daily.     . Cyanocobalamin (VITAMIN B 12 PO) Take 1 tablet by mouth daily.    . cyclobenzaprine (FLEXERIL) 5 MG tablet Take 1 tablet (5 mg total) by mouth 3 (three) times daily as needed for muscle spasms. 60 tablet 0  . dextromethorphan-guaiFENesin (MUCINEX DM) 30-600 MG 12hr tablet Take 1 tablet by mouth 2 (two) times daily. 20 tablet 0  . empagliflozin (JARDIANCE) 25 MG TABS tablet Take 25 mg by mouth daily. 90 tablet 3  . fluticasone (FLONASE) 50 MCG/ACT nasal spray Place 2 sprays into both nostrils daily. 16 g 0  . glucose blood (FREESTYLE LITE) test strip Use to check blood sugar 1 time per day. Dx Code: E11.9 100 each 2  . ibuprofen (ADVIL,MOTRIN) 800 MG tablet Take 1 tablet (800 mg total) by mouth 3 (three) times daily. 21 tablet 0  . Lancets (FREESTYLE) lancets Use to check blood sugar 1 time per day. Dx Code: E11.9 100 each 2  . levocetirizine (XYZAL) 5 MG tablet Take 5 mg by mouth at bedtime.    . meclizine (ANTIVERT) 25 MG tablet TAKE 1 TABLET ( 25MG ) BY MOUTH THREE TIME DAILY AS NEEDED FOR DIZZINESS    . methocarbamol (ROBAXIN) 500 MG tablet TAKE 1 TABLET (500MG ) BY MOUTH THREE TIME DAILY AS NEEDED FOR MUSCLE SPASM    . montelukast (SINGULAIR) 10 MG tablet Take 10 mg by mouth daily.    . Multiple Vitamins-Minerals (ZINC PO) Take 1 tablet by mouth daily.    . nebivolol (BYSTOLIC) 5 MG tablet TAKE 1 TABLET BY MOUTH ONCE DAILY AS  NEEDED 90 tablet 1  . repaglinide (PRANDIN) 0.5 MG tablet TAKE 1 TABLET (0.5 MG TOTAL) BY MOUTH 2 (TWO) TIMES DAILY BEFORE A MEAL. 180 tablet 3  . rosuvastatin (CRESTOR) 5 MG tablet Take 1 tablet (5 mg total) by mouth daily. Please make overdue appt with Dr. before anymore refills. 2nd attempt 15 tablet 0  . Spacer/Aero-Holding Chambers (AEROCHAMBER PLUS) inhaler Use as instructed 1 each 2  . VITAMIN A PO Take 1 tablet by mouth  daily.     No current facility-administered medications on file prior to visit.    Allergies  Allergen Reactions  . Penicillins Hives and Swelling    Has patient had a PCN reaction causing immediate rash, facial/tongue/throat swelling, SOB or lightheadedness with hypotension: Yes Has patient had a PCN reaction causing severe rash involving mucus membranes or skin necrosis: No Has patient had a PCN reaction that required hospitalization No Has patient had a PCN reaction occurring within the last 10 years: No If all of the above answers are "NO", then may proceed with Cephalosporin use.   Eye swelling  . Metoprolol     "LOL" Drugs cause hair to fall out  . Nitrofurantoin Macrocrystal Other (See Comments)  . Other Nausea And Vomiting    General Anesthesia     Family History  Problem Relation Age of Onset  . Valvular heart disease Mother        Aortic valve replacement  . Diabetes Mother   . Heart attack Maternal Grandmother        50s  . Stroke Maternal Grandmother   . Diabetes Sister   . Breast cancer Sister        Half Sister  . Diabetes Brother   . Breast cancer Paternal Aunt   . Breast cancer Cousin 35    BP 138/90   Pulse 78   Ht 5\' 2"  (1.575 m)   Wt 190 lb (86.2 kg)   SpO2 98%   BMI 34.75 kg/m    Review of Systems Denies nausea.      Objective:   Physical Exam VITAL SIGNS:  See vs page GENERAL: no distress Pulses: dorsalis pedis intact bilat.   MSK: no deformity of the feet CV: no leg edema Skin:  no ulcer on the feet.   normal color and temp on the feet. Neuro: sensation is intact to touch on the feet.    A1c=7.1%    Assessment & Plan:  Type 2 DM: uncontrolled.   Patient Instructions  I have sent a prescription to your pharmacy, to increase the Rybelsus.  Please continue the same other diabetes meds.   check your blood sugar once a day.  vary the time of day when you check, between before the 3 meals, and at bedtime.  also check if you have symptoms of your blood sugar being too high or too low.  please keep a record of the readings and bring it to your next appointment here (or you can bring the meter itself).  You can write it on any piece of paper.  please call sooner if your blood sugar goes below 70, or if you have a lot of readings over 200. Please come back for a follow-up appointment in 6 months.

## 2020-09-05 MED FILL — MONTELUKAST SOD 10 MG TAB: 10 | 30 days supply | Qty: 30 | Fill #2

## 2020-09-07 MED FILL — VANIQA 13.9% CREAM: 13.9 | 30 days supply | Qty: 45 | Fill #0

## 2020-11-01 ENCOUNTER — Other Ambulatory Visit (HOSPITAL_COMMUNITY): Payer: Self-pay | Admitting: Registered Nurse

## 2020-11-01 MED FILL — HYDROCHLOROTHIAZIDE 25 MG T: 25 | 90 days supply | Qty: 90 | Fill #1

## 2020-11-01 MED FILL — MONTELUKAST SOD 10 MG TAB: 10 | 30 days supply | Qty: 30 | Fill #0

## 2020-11-01 MED FILL — ROSUVASTATIN CALCIUM 5 MG T: 5 | 90 days supply | Qty: 90 | Fill #1

## 2020-11-29 MED FILL — DOXYCYCLINE HYCLATE 100 MG: 100 | 30 days supply | Qty: 60 | Fill #1

## 2020-12-21 MED FILL — JARDIANCE 25 MG TABLET: 25 | 90 days supply | Qty: 90 | Fill #1

## 2020-12-21 MED FILL — NEBIVOLOL HCL 10 MG TABS: 10 | 90 days supply | Qty: 90 | Fill #2

## 2020-12-21 MED FILL — MONTELUKAST SOD 10 MG TAB: 10 | 30 days supply | Qty: 30 | Fill #1

## 2020-12-21 MED FILL — LEVOCETIRIZINE 5 MG TABLET: 5 | 90 days supply | Qty: 90 | Fill #2

## 2020-12-21 MED FILL — RYBELSUS 7 MG TABS: 7 | 90 days supply | Qty: 90 | Fill #1

## 2021-01-29 ENCOUNTER — Other Ambulatory Visit (HOSPITAL_COMMUNITY): Payer: Self-pay

## 2021-01-29 MED ORDER — AZITHROMYCIN 250 MG PO TABS
ORAL_TABLET | ORAL | 0 refills | Status: AC
  Filled 2021-01-29: qty 6, 5d supply, fill #0

## 2021-01-30 ENCOUNTER — Other Ambulatory Visit (HOSPITAL_COMMUNITY): Payer: Self-pay

## 2021-01-30 MED ORDER — CARESTART COVID-19 HOME TEST VI KIT
PACK | 0 refills | Status: DC
  Filled 2021-01-30: qty 4, 4d supply, fill #0

## 2021-02-15 ENCOUNTER — Ambulatory Visit: Payer: No Typology Code available for payment source | Admitting: Internal Medicine

## 2021-02-15 ENCOUNTER — Encounter: Payer: Self-pay | Admitting: Internal Medicine

## 2021-02-15 ENCOUNTER — Other Ambulatory Visit (HOSPITAL_COMMUNITY): Payer: Self-pay

## 2021-02-15 ENCOUNTER — Other Ambulatory Visit: Payer: Self-pay

## 2021-02-15 VITALS — BP 106/68 | HR 71 | Ht 62.0 in | Wt 187.2 lb

## 2021-02-15 DIAGNOSIS — I493 Ventricular premature depolarization: Secondary | ICD-10-CM

## 2021-02-15 DIAGNOSIS — I10 Essential (primary) hypertension: Secondary | ICD-10-CM | POA: Diagnosis not present

## 2021-02-15 MED ORDER — FLECAINIDE ACETATE 50 MG PO TABS
50.0000 mg | ORAL_TABLET | Freq: Two times a day (BID) | ORAL | 11 refills | Status: DC
  Filled 2021-02-15: qty 60, 30d supply, fill #0
  Filled 2021-04-04: qty 60, 30d supply, fill #1
  Filled 2021-06-08: qty 60, 30d supply, fill #2
  Filled 2021-09-20: qty 60, 30d supply, fill #3
  Filled 2021-12-23: qty 60, 30d supply, fill #4

## 2021-02-15 NOTE — Patient Instructions (Addendum)
Medication Instructions:  Your physician has recommended you make the following change in your medication:   1.  START taking flecainide 50 mg-  Take one tablet by mouth twice a day   Labwork: You will get lab work in 2 weeks-same day as your EKG  Testing/Procedures: None ordered.  Follow-Up:  You will have a nurse visit in 2 weeks for an EKG--March 01, 2021 at 8:00 am  Your physician wants you to follow-up in: 4 months with Lewayne Bunting, MD    Any Other Special Instructions Will Be Listed Below (If Applicable).  If you need a refill on your cardiac medications before your next appointment, please call your pharmacy.   Flecainide tablets What is this medicine? FLECAINIDE (FLEK a nide) is an antiarrhythmic drug. This medicine is used to prevent irregular heart rhythm. It can also slow down fast heartbeats called tachycardia. This medicine may be used for other purposes; ask your health care provider or pharmacist if you have questions. COMMON BRAND NAME(S): Tambocor What should I tell my health care provider before I take this medicine? They need to know if you have any of these conditions:  abnormal levels of potassium in the blood  heart disease including heart rhythm and heart rate problems  kidney or liver disease  recent heart attack  an unusual or allergic reaction to flecainide, local anesthetics, other medicines, foods, dyes, or preservatives  pregnant or trying to get pregnant  breast-feeding How should I use this medicine? Take this medicine by mouth with a glass of water. Follow the directions on the prescription label. You can take this medicine with or without food. Take your doses at regular intervals. Do not take your medicine more often than directed. Do not stop taking this medicine suddenly. This may cause serious, heart-related side effects. If your doctor wants you to stop the medicine, the dose may be slowly lowered over time to avoid any side  effects. Talk to your pediatrician regarding the use of this medicine in children. While this drug may be prescribed for children as young as 1 year of age for selected conditions, precautions do apply. Overdosage: If you think you have taken too much of this medicine contact a poison control center or emergency room at once. NOTE: This medicine is only for you. Do not share this medicine with others. What if I miss a dose? If you miss a dose, take it as soon as you can. If it is almost time for your next dose, take only that dose. Do not take double or extra doses. What may interact with this medicine? Do not take this medicine with any of the following medications:  amoxapine  arsenic trioxide  certain antibiotics like clarithromycin, erythromycin, gatifloxacin, gemifloxacin, levofloxacin, moxifloxacin, sparfloxacin, or troleandomycin  certain antidepressants called tricyclic antidepressants like amitriptyline, imipramine, or nortriptyline  certain medicines to control heart rhythm like disopyramide, encainide, moricizine, procainamide, propafenone, and quinidine  cisapride  delavirdine  droperidol  haloperidol  hawthorn  imatinib  levomethadyl  maprotiline  medicines for malaria like chloroquine and halofantrine  pentamidine  phenothiazines like chlorpromazine, mesoridazine, prochlorperazine, thioridazine  pimozide  quinine  ranolazine  ritonavir  sertindole This medicine may also interact with the following medications:  cimetidine  dofetilide  medicines for angina or high blood pressure  medicines to control heart rhythm like amiodarone and digoxin  ziprasidone This list may not describe all possible interactions. Give your health care provider a list of all the medicines, herbs, non-prescription drugs,  or dietary supplements you use. Also tell them if you smoke, drink alcohol, or use illegal drugs. Some items may interact with your medicine. What  should I watch for while using this medicine? Visit your doctor or health care professional for regular checks on your progress. Because your condition and the use of this medicine carries some risk, it is a good idea to carry an identification card, necklace or bracelet with details of your condition, medications and doctor or health care professional. Check your blood pressure and pulse rate regularly. Ask your health care professional what your blood pressure and pulse rate should be, and when you should contact him or her. Your doctor or health care professional also may schedule regular blood tests and electrocardiograms to check your progress. You may get drowsy or dizzy. Do not drive, use machinery, or do anything that needs mental alertness until you know how this medicine affects you. Do not stand or sit up quickly, especially if you are an older patient. This reduces the risk of dizzy or fainting spells. Alcohol can make you more dizzy, increase flushing and rapid heartbeats. Avoid alcoholic drinks. What side effects may I notice from receiving this medicine? Side effects that you should report to your doctor or health care professional as soon as possible:  chest pain, continued irregular heartbeats  difficulty breathing  swelling of the legs or feet  trembling, shaking  unusually weak or tired Side effects that usually do not require medical attention (report to your doctor or health care professional if they continue or are bothersome):  blurred vision  constipation  headache  nausea, vomiting  stomach pain This list may not describe all possible side effects. Call your doctor for medical advice about side effects. You may report side effects to FDA at 1-800-FDA-1088. Where should I keep my medicine? Keep out of the reach of children. Store at room temperature between 15 and 30 degrees C (59 and 86 degrees F). Protect from light. Keep container tightly closed. Throw away  any unused medicine after the expiration date. NOTE: This sheet is a summary. It may not cover all possible information. If you have questions about this medicine, talk to your doctor, pharmacist, or health care provider.  2021 Elsevier/Gold Standard (2018-09-07 11:41:38)

## 2021-02-15 NOTE — Progress Notes (Signed)
HPI Carolyn Phillips returns today for followup. She is a pleasant 45 yo woman with a remote h/o WPW s/p ablation. She developed palpitations and was seen by me and started monitoring her heart with her apple watch. She has had documented PVC's. No chest pain or sob. No syncope.In the interim, she has noted and increase in her PVC's. She has been losing weight and is down 15 lbs. Allergies  Allergen Reactions  . Penicillins Hives and Swelling    Has patient had a PCN reaction causing immediate rash, facial/tongue/throat swelling, SOB or lightheadedness with hypotension: Yes Has patient had a PCN reaction causing severe rash involving mucus membranes or skin necrosis: No Has patient had a PCN reaction that required hospitalization No Has patient had a PCN reaction occurring within the last 10 years: No If all of the above answers are "NO", then may proceed with Cephalosporin use.   Eye swelling  . Metoprolol     "LOL" Drugs cause hair to fall out  . Nitrofurantoin Macrocrystal Other (See Comments)  . Other Nausea And Vomiting    General Anesthesia      Current Outpatient Medications  Medication Sig Dispense Refill  . acetaminophen (TYLENOL) 500 MG tablet Take 1,000 mg by mouth every 6 (six) hours as needed for mild pain.    Marland Kitchen albuterol (PROVENTIL HFA;VENTOLIN HFA) 108 (90 Base) MCG/ACT inhaler Inhale 2 puffs into the lungs every 4 (four) hours as needed for wheezing or shortness of breath. Dispense with aerochamber 1 Inhaler 0  . benzonatate (TESSALON) 200 MG capsule Take 1 capsule (200 mg total) by mouth 2 (two) times daily as needed for cough. 20 capsule 0  . Biotin 2500 MCG CAPS Take 2,500 mcg by mouth daily.     . Blood Glucose Monitoring Suppl (FREESTYLE LITE) DEVI Use to check blood sugar 1 time per day. Dx Code: E11.9 1 each 2  . cetirizine (ZYRTEC) 10 MG tablet Take 10 mg by mouth daily as needed for allergies.     . Cholecalciferol (VITAMIN D3) 3000 UNITS TABS Take 5,000  Units by mouth daily.     Marland Kitchen COVID-19 At Home Antigen Test [facility] COVID-19 HOME TEST) KIT Use as directed (Patient taking differently: Use as directed) 4 each 0  . Cyanocobalamin (VITAMIN B 12 PO) Take 1 tablet by mouth daily.    . cyclobenzaprine (FLEXERIL) 5 MG tablet Take 1 tablet (5 mg total) by mouth 3 (three) times daily as needed for muscle spasms. 60 tablet 0  . dextromethorphan-guaiFENesin (MUCINEX DM) 30-600 MG 12hr tablet Take 1 tablet by mouth 2 (two) times daily. 20 tablet 0  . doxycycline (VIBRAMYCIN) 100 MG capsule TAKE 1 CAPSULE BY MOUTH 2 TIMES DAILY WITH FOOD & WATER 60 capsule 3  . Eflornithine HCl 13.9 % cream APPLY TO THE AFFECTED AREA(S) UP TO 2 TIMES DAILY AS NEEDED 45 g 99  . empagliflozin (JARDIANCE) 25 MG TABS tablet Take 25 mg by mouth daily. 90 tablet 3  . empagliflozin (JARDIANCE) 25 MG TABS tablet TAKE 1 TABLET BY MOUTH DAILY IN THE MORNING ONCE A DAY 90 tablet 2  . flecainide (TAMBOCOR) 50 MG tablet Take 1 tablet (50 mg total) by mouth 2 (two) times daily. 60 tablet 11  . fluticasone (FLONASE) 50 MCG/ACT nasal spray Place 2 sprays into both nostrils daily. 16 g 0  . glucose blood (FREESTYLE LITE) test strip Use to check blood sugar 1 time per day. Dx Code: E11.9 100 each  2  . hydrochlorothiazide (HYDRODIURIL) 25 MG tablet TAKE 1 TABLET BY MOUTH ONCE DAILY FOR BLOOD PRESSURE AND SWELLING. 90 tablet 2  . ibuprofen (ADVIL,MOTRIN) 800 MG tablet Take 1 tablet (800 mg total) by mouth 3 (three) times daily. 21 tablet 0  . Lancets (FREESTYLE) lancets Use to check blood sugar 1 time per day. Dx Code: E11.9 100 each 2  . levocetirizine (XYZAL) 5 MG tablet Take 5 mg by mouth at bedtime.    . meclizine (ANTIVERT) 25 MG tablet TAKE 1 TABLET ( 25MG) BY MOUTH THREE TIME DAILY AS NEEDED FOR DIZZINESS    . methocarbamol (ROBAXIN) 500 MG tablet TAKE 1 TABLET (500MG) BY MOUTH THREE TIME DAILY AS NEEDED FOR MUSCLE SPASM    . montelukast (SINGULAIR) 10 MG tablet Take 10 mg by mouth  daily.    . montelukast (SINGULAIR) 10 MG tablet TAKE 1 TABLET BY MOUTH DAILY 30 tablet 2  . montelukast (SINGULAIR) 10 MG tablet TAKE 1 TABLET BY MOUTH DAILY 30 tablet 2  . Multiple Vitamins-Minerals (ZINC PO) Take 1 tablet by mouth daily.    . nebivolol (BYSTOLIC) 5 MG tablet TAKE 1 TABLET BY MOUTH ONCE DAILY AS NEEDED 90 tablet 1  . repaglinide (PRANDIN) 0.5 MG tablet TAKE 1 TABLET (0.5 MG TOTAL) BY MOUTH 2 (TWO) TIMES DAILY BEFORE A MEAL. 180 tablet 3  . rosuvastatin (CRESTOR) 5 MG tablet Take 1 tablet (5 mg total) by mouth daily. Please make overdue appt with Dr. Lovena Le before anymore refills. 2nd attempt 15 tablet 0  . rosuvastatin (CRESTOR) 5 MG tablet TAKE 1 TABLET BY MOUTH ONCE DAILY 90 tablet 3  . Semaglutide 7 MG TABS TAKE 1 TABLET BY MOUTH ONCE DAILY 90 tablet 3  . Spacer/Aero-Holding Chambers (AEROCHAMBER PLUS) inhaler Use as instructed 1 each 2  . VITAMIN A PO Take 1 tablet by mouth daily.    Marland Kitchen levocetirizine (XYZAL) 5 MG tablet TAKE 1 TABLET BY MOUTH AT BEDTIME 90 tablet 3  . nebivolol (BYSTOLIC) 10 MG tablet TAKE 1 TABLET BY MOUTH AT BEDTIME 90 tablet 3   No current facility-administered medications for this visit.     Past Medical History:  Diagnosis Date  . Beta thalassemia trait   . Complication of anesthesia   . Diabetes mellitus without complication ([place])   . Dysrhythmia    WPW-ablation  . GERD (gastroesophageal reflux disease)   . Headache   . Hyperlipemia   . Hypertension   . Obesity   . PONV (postoperative nausea and vomiting)   . Wolff-Parkinson-White syndrome     ROS:   All systems reviewed and negative except as noted in the HPI.   Past Surgical History:  Procedure Laterality Date  . ABDOMINAL HYSTERECTOMY    . BLADDER REPAIR    . BREAST REDUCTION SURGERY    . CHONDROPLASTY Left 09/13/2015   Procedure: CHONDROPLASTY;  Surgeon: Frederik Pear, MD;  Location: [place];  Service: Orthopedics;  Laterality: Left;  . KNEE ARTHROSCOPY  WITH DRILLING/MICROFRACTURE Left 09/13/2015   Procedure: KNEE ARTHROSCOPY WITH DRILLING/MICROFRACTURE;  Surgeon: Frederik Pear, MD;  Location: [place];  Service: Orthopedics;  Laterality: Left;  . REDUCTION MAMMAPLASTY Bilateral 2001  . SUPRAVENTRICULAR TACHYCARDIA ABLATION N/A 07/04/2014   Procedure: WPW ABLATION;  Surgeon: Evans Lance, MD;  Location: [facility] CATH LAB;  Service: Cardiovascular;  Laterality: N/A;  . TUBAL LIGATION       Family History  Problem Relation Age of Onset  . Valvular heart  disease Mother        Aortic valve replacement  . Diabetes Mother   . Heart attack Maternal Grandmother        50s  . Stroke Maternal Grandmother   . Diabetes Sister   . Breast cancer Sister        74 Sister  . Diabetes Brother   . Breast cancer Paternal Aunt   . Breast cancer Cousin 56     Social History   Socioeconomic History  . Marital status: Legally Separated    Spouse name: Not on file  . Number of children: 2  . Years of education: College  . Highest education level: Not on file  Occupational History  . Occupation: works at [employer], student  Tobacco Use  . Smoking status: Never Smoker  . Smokeless tobacco: Never Used  . Tobacco comment: "only smoked for one year al ong time ago "  Substance and Sexual Activity  . Alcohol use: Yes    Alcohol/week: 0.0 standard drinks    Comment: occasional  . Drug use: No  . Sexual activity: Not on file  Other Topics Concern  . Not on file  Social History Narrative   Pt lives with mother, children and husband.   Caffeine use: small coffee per day   1 mountain dew per day   Social Determinants of Health   Financial Resource Strain: Not on file  Food Insecurity: Not on file  Transportation Needs: Not on file  Physical Activity: Not on file  Stress: Not on file  Social Connections: Not on file  Intimate Partner Violence: Not on file     BP 106/68   Pulse 71   Ht _0  (1.575 m)   Wt 187 lb 3.2 oz  (84.9 kg)   SpO2 95%   BMI 34.24 kg/m   Physical Exam:  Well appearing NAD HEENT: Unremarkable Neck:  No JVD, no thyromegally Lymphatics:  No adenopathy Back:  No CVA tenderness Lungs:  Clear with no wheezes HEART:  Regular rate rhythm, no murmurs, no rubs, no clicks Abd:  soft, positive bowel sounds, no organomegally, no rebound, no guarding Ext:  2 plus pulses, no edema, no cyanosis, no clubbing Skin:  No rashes no nodules Neuro:  CN II through XII intact, motor grossly intact  EKG - nsr  DEVICE  Normal device function.  See PaceArt for details.   Assess/Plan: 1. PVC's - her symptoms appear to be worse. She will continue her beta blocker. We discussed flecainide and because her symptoms are worse, we will start 50 bid. 2. HTN - her bp today is good. She thinks it has improved since losing weight. 3. Obesity - she has been losing weight by changing her diet. Continue. 4. WPW - she is s/p ablation and has no pre-excitation and no recurrent sustained symptoms.   Carolyn Overlie Seiji Wiswell,MD

## 2021-02-20 MED FILL — Montelukast Sodium Tab 10 MG (Base Equiv): ORAL | 30 days supply | Qty: 30 | Fill #0 | Status: AC

## 2021-02-20 MED FILL — Repaglinide Tab 0.5 MG: ORAL | 90 days supply | Qty: 180 | Fill #0 | Status: AC

## 2021-02-20 MED FILL — Doxycycline Hyclate Cap 100 MG: ORAL | 30 days supply | Qty: 60 | Fill #0 | Status: AC

## 2021-02-21 ENCOUNTER — Other Ambulatory Visit (HOSPITAL_COMMUNITY): Payer: Self-pay

## 2021-02-22 ENCOUNTER — Other Ambulatory Visit (HOSPITAL_COMMUNITY): Payer: Self-pay

## 2021-02-23 ENCOUNTER — Other Ambulatory Visit (HOSPITAL_COMMUNITY): Payer: Self-pay

## 2021-02-23 MED FILL — Eflornithine HCl Cream 13.9%: CUTANEOUS | 30 days supply | Qty: 45 | Fill #0 | Status: AC

## 2021-02-27 ENCOUNTER — Other Ambulatory Visit (HOSPITAL_COMMUNITY): Payer: Self-pay

## 2021-02-28 ENCOUNTER — Other Ambulatory Visit: Payer: Self-pay

## 2021-02-28 ENCOUNTER — Ambulatory Visit: Payer: No Typology Code available for payment source | Admitting: Endocrinology

## 2021-02-28 ENCOUNTER — Other Ambulatory Visit (HOSPITAL_COMMUNITY): Payer: Self-pay

## 2021-02-28 VITALS — BP 120/90 | HR 89 | Ht 62.0 in | Wt 191.2 lb

## 2021-02-28 DIAGNOSIS — E119 Type 2 diabetes mellitus without complications: Secondary | ICD-10-CM | POA: Diagnosis not present

## 2021-02-28 LAB — POCT GLYCOSYLATED HEMOGLOBIN (HGB A1C): Hemoglobin A1C: 7 % — AB (ref 4.0–5.6)

## 2021-02-28 MED ORDER — RYBELSUS 14 MG PO TABS
1.0000 | ORAL_TABLET | Freq: Every day | ORAL | 3 refills | Status: DC
  Filled 2021-02-28: qty 90, 90d supply, fill #0

## 2021-02-28 NOTE — Patient Instructions (Addendum)
I have sent a prescription to your pharmacy, to increase the Rybelsus again.  Please continue the same other diabetes meds.   check your blood sugar once a day.  vary the time of day when you check, between before the 3 meals, and at bedtime.  also check if you have symptoms of your blood sugar being too high or too low.  please keep a record of the readings and bring it to your next appointment here (or you can bring the meter itself).  You can write it on any piece of paper.  please call us sooner if your blood sugar goes below 70, or if you have a lot of readings over 200. Please come back for a follow-up appointment in 6 months.

## 2021-02-28 NOTE — Progress Notes (Signed)
Subjective:    Patient ID: Carolyn Phillips, female    DOB: [birth year], 45 y.o.   MRN: [id number]  HPI Pt returns for f/u of diabetes mellitus:  DM type: 2 Dx'ed: 6979 Complications: none.  Therapy: 3 oral meds.   GDM: never.  DKA: never Severe hypoglycemia: never.  Pancreatitis: never.  Other: she has never been on insulin; diarrhea, edema, and vertigo limit oral rx options; She did not tolerate janumet (diarrhea).   Interval history: pt states she feels well in general.  Meter is downloaded today, and the printout is scanned into the record.  cbg varies from 128-138, but there are only 3 cbg's.  She takes meds as rx'ed.   Past Medical History:  Diagnosis Date  . Beta thalassemia trait   . Complication of anesthesia   . Diabetes mellitus without complication ([place])   . Dysrhythmia    WPW-ablation  . GERD (gastroesophageal reflux disease)   . Headache   . Hyperlipemia   . Hypertension   . Obesity   . PONV (postoperative nausea and vomiting)   . Wolff-Parkinson-White syndrome     Past Surgical History:  Procedure Laterality Date  . ABDOMINAL HYSTERECTOMY    . BLADDER REPAIR    . BREAST REDUCTION SURGERY    . CHONDROPLASTY Left 09/13/2015   Procedure: CHONDROPLASTY;  Surgeon: Frederik Pear, MD;  Location: [place];  Service: Orthopedics;  Laterality: Left;  . KNEE ARTHROSCOPY WITH DRILLING/MICROFRACTURE Left 09/13/2015   Procedure: KNEE ARTHROSCOPY WITH DRILLING/MICROFRACTURE;  Surgeon: Frederik Pear, MD;  Location: [place];  Service: Orthopedics;  Laterality: Left;  . REDUCTION MAMMAPLASTY Bilateral 2001  . SUPRAVENTRICULAR TACHYCARDIA ABLATION N/A 07/04/2014   Procedure: WPW ABLATION;  Surgeon: Evans Lance, MD;  Location: [facility] C[facility] LAB;  Service: Cardiovascular;  Laterality: N/A;  . TUBAL LIGATION      Social History   Socioeconomic History  . Marital status: Legally Separated    Spouse name: Not on file  . Number of children:  2  . Years of education: College  . Highest education level: Not on file  Occupational History  . Occupation: works at [employer], student  Tobacco Use  . Smoking status: Never Smoker  . Smokeless tobacco: Never Used  . Tobacco comment: "only smoked for one year al ong time ago "  Substance and Sexual Activity  . Alcohol use: Yes    Alcohol/week: 0.0 standard drinks    Comment: occasional  . Drug use: No  . Sexual activity: Not on file  Other Topics Concern  . Not on file  Social History Narrative   Pt lives with mother, children and husband.   Caffeine use: small coffee per day   1 mountain dew per day   Social Determinants of Health   Financial Resource Strain: Not on file  Food Insecurity: Not on file  Transportation Needs: Not on file  Physical Activity: Not on file  Stress: Not on file  Social Connections: Not on file  Intimate Partner Violence: Not on file    Current Outpatient Medications on File Prior to Visit  Medication Sig Dispense Refill  . acetaminophen (TYLENOL) 500 MG tablet Take 1,000 mg by mouth every 6 (six) hours as needed for mild pain.    . Biotin 2500 MCG CAPS Take 2,500 mcg by mouth daily.     . Blood Glucose Monitoring Suppl (FREESTYLE LITE) DEVI Use to check blood sugar 1 time per day. Dx Code: E11.9 1  each 2  . cetirizine (ZYRTEC) 10 MG tablet Take 10 mg by mouth daily as needed for allergies.     . Cholecalciferol (VITAMIN D3) 3000 UNITS TABS Take 5,000 Units by mouth daily.     Marland Kitchen COVID-19 At Home Antigen Test [facility] COVID-19 HOME TEST) KIT Use as directed (Patient taking differently: Use as directed) 4 each 0  . Cyanocobalamin (VITAMIN B 12 PO) Take 1 tablet by mouth daily.    . cyclobenzaprine (FLEXERIL) 5 MG tablet Take 1 tablet (5 mg total) by mouth 3 (three) times daily as needed for muscle spasms. 60 tablet 0  . doxycycline (VIBRAMYCIN) 100 MG capsule TAKE 1 CAPSULE BY MOUTH 2 TIMES DAILY WITH FOOD & WATER 60 capsule 3  . Eflornithine  HCl 13.9 % cream APPLY TO THE AFFECTED AREA(S) UP TO 2 TIMES DAILY AS NEEDED 45 g 99  . empagliflozin (JARDIANCE) 25 MG TABS tablet Take 25 mg by mouth daily. 90 tablet 3  . flecainide (TAMBOCOR) 50 MG tablet Take 1 tablet (50 mg total) by mouth 2 (two) times daily. 60 tablet 11  . fluticasone (FLONASE) 50 MCG/ACT nasal spray Place 2 sprays into both nostrils daily. 16 g 0  . glucose blood (FREESTYLE LITE) test strip Use to check blood sugar 1 time per day. Dx Code: E11.9 100 each 2  . hydrochlorothiazide (HYDRODIURIL) 25 MG tablet TAKE 1 TABLET BY MOUTH ONCE DAILY FOR BLOOD PRESSURE AND SWELLING. 90 tablet 2  . ibuprofen (ADVIL,MOTRIN) 800 MG tablet Take 1 tablet (800 mg total) by mouth 3 (three) times daily. 21 tablet 0  . Lancets (FREESTYLE) lancets Use to check blood sugar 1 time per day. Dx Code: E11.9 100 each 2  . levocetirizine (XYZAL) 5 MG tablet Take 5 mg by mouth at bedtime.    . meclizine (ANTIVERT) 25 MG tablet TAKE 1 TABLET ( 25MG) BY MOUTH THREE TIME DAILY AS NEEDED FOR DIZZINESS    . methocarbamol (ROBAXIN) 500 MG tablet TAKE 1 TABLET (500MG) BY MOUTH THREE TIME DAILY AS NEEDED FOR MUSCLE SPASM    . montelukast (SINGULAIR) 10 MG tablet TAKE 1 TABLET BY MOUTH DAILY 30 tablet 2  . Multiple Vitamins-Minerals (ZINC PO) Take 1 tablet by mouth daily.    . nebivolol (BYSTOLIC) 5 MG tablet TAKE 1 TABLET BY MOUTH ONCE DAILY AS NEEDED 90 tablet 1  . repaglinide (PRANDIN) 0.5 MG tablet TAKE 1 TABLET (0.5 MG TOTAL) BY MOUTH 2 (TWO) TIMES DAILY BEFORE A MEAL. 180 tablet 3  . rosuvastatin (CRESTOR) 5 MG tablet TAKE 1 TABLET BY MOUTH ONCE DAILY 90 tablet 3  . Spacer/Aero-Holding Chambers (AEROCHAMBER PLUS) inhaler Use as instructed 1 each 2  . VITAMIN A PO Take 1 tablet by mouth daily.    Marland Kitchen levocetirizine (XYZAL) 5 MG tablet TAKE 1 TABLET BY MOUTH AT BEDTIME 90 tablet 3  . nebivolol (BYSTOLIC) 10 MG tablet TAKE 1 TABLET BY MOUTH AT BEDTIME 90 tablet 3   No current facility-administered  medications on file prior to visit.    Allergies  Allergen Reactions  . Penicillins Hives and Swelling    Has patient had a PCN reaction causing immediate rash, facial/tongue/throat swelling, SOB or lightheadedness with hypotension: Yes Has patient had a PCN reaction causing severe rash involving mucus membranes or skin necrosis: No Has patient had a PCN reaction that required hospitalization No Has patient had a PCN reaction occurring within the last 10 years: No If all of the above answers are "NO", then  may proceed with Cephalosporin use.   Eye swelling  . Metoprolol     "LOL" Drugs cause hair to fall out  . Nitrofurantoin Macrocrystal Other (See Comments)  . Other Nausea And Vomiting    General Anesthesia     Family History  Problem Relation Age of Onset  . Valvular heart disease Mother        Aortic valve replacement  . Diabetes Mother   . Heart attack Maternal Grandmother        71s  . Stroke Maternal Grandmother   . Diabetes Sister   . Breast cancer Sister        21 Sister  . Diabetes Brother   . Breast cancer Paternal Aunt   . Breast cancer Cousin 35    BP 120/90 (BP Location: Right Arm, Patient Position: Sitting, Cuff Size: Large)   Pulse 89   Ht 5' 2"  (1.575 m)   Wt 191 lb 3.2 oz (86.7 kg)   SpO2 97%   BMI 34.97 kg/m    Review of Systems     Objective:   Physical Exam VITAL SIGNS:  See vs page GENERAL: no distress Pulses: dorsalis pedis intact bilat.   MSK: no deformity of the feet CV: no leg edema Skin:  no ulcer on the feet.  normal color and temp on the feet. Neuro: sensation is intact to touch on the feet   A1c=7.0%    Assessment & Plan:  Type 2 DM: uncontrolled  Patient Instructions  I have sent a prescription to your pharmacy, to increase the Rybelsus again.  Please continue the same other diabetes meds.   check your blood sugar once a day.  vary the time of day when you check, between before the 3 meals, and at bedtime.  also  check if you have symptoms of your blood sugar being too high or too low.  please keep a record of the readings and bring it to your next appointment here (or you can bring the meter itself).  You can write it on any piece of paper.  please call us sooner if your blood sugar goes below 70, or if you have a lot of readings over 200. Please come back for a follow-up appointment in 6 months.

## 2021-03-01 ENCOUNTER — Other Ambulatory Visit: Payer: No Typology Code available for payment source | Admitting: *Deleted

## 2021-03-01 ENCOUNTER — Ambulatory Visit (INDEPENDENT_AMBULATORY_CARE_PROVIDER_SITE_OTHER): Payer: No Typology Code available for payment source

## 2021-03-01 ENCOUNTER — Other Ambulatory Visit (HOSPITAL_COMMUNITY): Payer: Self-pay

## 2021-03-01 VITALS — HR 68 | Ht 62.0 in

## 2021-03-01 DIAGNOSIS — I10 Essential (primary) hypertension: Secondary | ICD-10-CM

## 2021-03-01 DIAGNOSIS — I493 Ventricular premature depolarization: Secondary | ICD-10-CM

## 2021-03-01 LAB — BASIC METABOLIC PANEL
BUN/Creatinine Ratio: 16 (ref 9–23)
BUN: 13 mg/dL (ref 6–24)
CO2: 25 mmol/L (ref 20–29)
Calcium: 9.9 mg/dL (ref 8.7–10.2)
Chloride: 98 mmol/L (ref 96–106)
Creatinine, Ser: 0.79 mg/dL (ref 0.57–1.00)
Glucose: 112 mg/dL — ABNORMAL HIGH (ref 65–99)
Potassium: 3.6 mmol/L (ref 3.5–5.2)
Sodium: 139 mmol/L (ref 134–144)
eGFR: 93 mL/min/{1.73_m2} (ref >59)

## 2021-03-01 MED ORDER — ALPRAZOLAM 0.25 MG PO TABS
0.2500 mg | ORAL_TABLET | Freq: Two times a day (BID) | ORAL | 3 refills | Status: DC | PRN
  Filled 2021-03-01: qty 60, 30d supply, fill #0

## 2021-03-01 NOTE — Patient Instructions (Signed)
Follow up as scheduled.  

## 2021-03-01 NOTE — Progress Notes (Signed)
1.) Reason for visit: new start flecainide  2.) Name of MD requesting visit: Dr. Ladona Ridgel  3.) H&P: Pt with PVC's-symptomatic  4.) ROS related to problem: Pt started on flecainide 50 mg PO BID for symptomatic PVC's.  Per Pt she has noted some improvement in palpitations on the flecainide but still has daily breakthrough.   5.) Assessment and plan per MD: EKG reviewed by Dr. Ladona Ridgel.  Dr. Ladona Ridgel notified Pt with continued palpitations.  Per Dr. Junie Spencer to increase to flecainide 75 mg PO BID with EKG in 2 weeks.  Will mychart message to advise.

## 2021-03-08 ENCOUNTER — Other Ambulatory Visit (HOSPITAL_COMMUNITY): Payer: Self-pay

## 2021-03-29 ENCOUNTER — Other Ambulatory Visit: Payer: Self-pay

## 2021-03-29 ENCOUNTER — Ambulatory Visit (INDEPENDENT_AMBULATORY_CARE_PROVIDER_SITE_OTHER): Payer: No Typology Code available for payment source

## 2021-03-29 VITALS — HR 66 | Ht 62.0 in

## 2021-03-29 DIAGNOSIS — I493 Ventricular premature depolarization: Secondary | ICD-10-CM

## 2021-03-29 NOTE — Progress Notes (Signed)
Reason for visit: increased flecainide  Name of MD requesting visit: Dr. Ladona Ridgel  H&P: PVC's  ROS related to problem: Increased flecainide to 75 mg BID PO for PVC's, palpitations.  Per Pt she has had no further palpitations on the flecainide 75 mg BID  Assessment and plan per MD: DOD reviewed EKG.  Will send to Dr. Ladona Ridgel for review.

## 2021-04-04 ENCOUNTER — Encounter: Payer: Self-pay | Admitting: Orthopaedic Surgery

## 2021-04-04 ENCOUNTER — Other Ambulatory Visit: Payer: Self-pay

## 2021-04-04 ENCOUNTER — Ambulatory Visit: Payer: No Typology Code available for payment source | Admitting: Orthopaedic Surgery

## 2021-04-04 ENCOUNTER — Other Ambulatory Visit (HOSPITAL_COMMUNITY): Payer: Self-pay

## 2021-04-04 DIAGNOSIS — G5691 Unspecified mononeuropathy of right upper limb: Secondary | ICD-10-CM | POA: Diagnosis not present

## 2021-04-04 DIAGNOSIS — G5690 Unspecified mononeuropathy of unspecified upper limb: Secondary | ICD-10-CM | POA: Insufficient documentation

## 2021-04-04 MED ORDER — METHYLPREDNISOLONE 4 MG PO TBPK
ORAL_TABLET | ORAL | 0 refills | Status: DC
  Filled 2021-04-04: qty 21, 6d supply, fill #0

## 2021-04-04 NOTE — Progress Notes (Signed)
Office Visit Note   Patient: Carolyn Phillips           Date of Birth: 1976-06-05           MRN: [id number] Visit Date: 04/04/2021              Requested by: Alysia Penna, MD [address] [place],  [place] [address] PCP: Alysia Penna, MD   Assessment & Plan: Visit Diagnoses:  1. Neuropathy of right upper extremity     Plan: Rather cute onset of bilateral upper extremity pain and numbness and tingling into all the digits of both hands.  By exam I think that Ms. Gist has bilateral carpal tunnel and ulnar nerve irritation.  No injury or trauma.  We will try a Medrol Dosepak after discussing with Dr. Everardo All who treats her diabetes.  Reevaluate in 1 week.  Also has to avoid sitting at a computer with arms on the table for at least a week.  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  Has splints at home but did not seem to make much of a difference  Follow-Up Instructions: Return in about 1 week (around 04/11/2021).   Orders:  No orders of the defined types were placed in this encounter.  Meds ordered this encounter  Medications   methylPREDNISolone (MEDROL DOSEPAK) 4 MG TBPK tablet    Sig: Taper dose, start with 6 tabs on day 1 then decrease by 1 tab daily( instructions on package)    Dispense:  21 tablet    Refill:  0      Procedures: No procedures performed   Clinical Data: No additional findings.   Subjective: Chief Complaint  Patient presents with   Right Arm - Numbness, Pain  Patient presents today for right arm pain. She said that it has bothered her for quite some time, but has really worsened in the last 8days. She said that she has pain in her shoulder and armpit. She has "electric shocks" in her arm, and numbness in her hand. She drops things. She has tried massage, ice packs, wrist braces, and muscle relaxers. She is right hand dominant. No neck pain. She is diabetic. She also states that her left arm started to bother her last night.  Denies any  neck pain.  HPI  Review of Systems   Objective: Vital Signs: Ht 5\' 2"  (1.575 m)   Wt 188 lb (85.3 kg)   BMI 34.39 kg/m   Physical Exam Constitutional:      Appearance: She is well-developed.  Eyes:     Pupils: Pupils are equal, round, and reactive to light.  Pulmonary:     Effort: Pulmonary effort is normal.  Skin:    General: Skin is warm and dry.  Neurological:     Mental Status: She is alert and oriented to person, place, and time.  Psychiatric:        Behavior: Behavior normal.    Ortho Exam awake alert and oriented x3.  Comfortable sitting.  No acute distress.  No pain with range of motion of the cervical spine in flexion extension or rotation.  No pain referred to either upper extremity with neck extension or any rotation.  Full overhead motion of both shoulders.  Elevation arm in flexion wrist causes pain into the radial 3 digits of both hands consistent with possible carpal tunnel.  Also has positive Tinel's over the median nerve at both wrists.  No tenderness about the shoulder arm elbow or forearm.  Positive Tinel over the median nerve is mention.  Also has tenderness over the ulnar nerve at both elbows with referred pain to the ulnar 2 digits.  Good opposition of thumb to little finger.  Normal AB duction and abduction of digits.  Good feeling to the tips of the fingers  Specialty Comments:  No specialty comments available.  Imaging: No results found.   PMFS History: Patient Active Problem List   Diagnosis Date Noted   Neuropathy, upper extremity 04/04/2021   PVC's (premature ventricular contractions) 02/15/2021   Left knee pain 07/08/2017   Dysuria 10/24/2016   Paresthesias 06/02/2016   Diabetes mellitus without complication (HCC)    Hyperlipemia    Chest pain 01/11/2016   Diabetes mellitus (HCC) 01/11/2016   Dyslipidemia 01/11/2016   Hypokalemia 01/11/2016   Wolff-Parkinson-White syndrome 07/04/2014   Wolff-Parkinson-White (WPW) syndrome 07/04/2014    Hypertension 05/25/2014   WPW (Wolff-Parkinson-White syndrome) 05/25/2014   Sleep apnea 05/25/2014   Past Medical History:  Diagnosis Date   Beta thalassemia trait    Complication of anesthesia    Diabetes mellitus without complication (HCC)    Dysrhythmia    WPW-ablation   GERD (gastroesophageal reflux disease)    Headache    Hyperlipemia    Hypertension    Obesity    PONV (postoperative nausea and vomiting)    Wolff-Parkinson-White syndrome     Family History  Problem Relation Age of Onset   Valvular heart disease Mother        Aortic valve replacement   Diabetes Mother    Heart attack Maternal Grandmother        32s   Stroke Maternal Grandmother    Diabetes Sister    Breast cancer Sister        Half Sister   Diabetes Brother    Breast cancer Paternal Aunt    Breast cancer Cousin 55    Past Surgical History:  Procedure Laterality Date   ABDOMINAL HYSTERECTOMY     BLADDER REPAIR     BREAST REDUCTION SURGERY     CHONDROPLASTY Left 09/13/2015   Procedure: CHONDROPLASTY;  Surgeon: Gean Birchwood, MD;  Location: [facility];  Service: Orthopedics;  Laterality: Left;   KNEE ARTHROSCOPY WITH DRILLING/MICROFRACTURE Left 09/13/2015   Procedure: KNEE ARTHROSCOPY WITH DRILLING/MICROFRACTURE;  Surgeon: Gean Birchwood, MD;  Location: [facility];  Service: Orthopedics;  Laterality: Left;   REDUCTION MAMMAPLASTY Bilateral 2001   SUPRAVENTRICULAR TACHYCARDIA ABLATION N/A 07/04/2014   Procedure: WPW ABLATION;  Surgeon: Marinus Maw, MD;  Location: [facility];  Service: Cardiovascular;  Laterality: N/A;   TUBAL LIGATION     Social History   Occupational History   Occupation: works at Urgent Care, student  Tobacco Use   Smoking status: Never   Smokeless tobacco: Never   Tobacco comments:    "only smoked for one year al ong time ago "  Substance and Sexual Activity   Alcohol use: Yes    Alcohol/week: 0.0 standard drinks    Comment: occasional   Drug  use: No   Sexual activity: Not on file

## 2021-04-05 ENCOUNTER — Other Ambulatory Visit (HOSPITAL_COMMUNITY): Payer: Self-pay

## 2021-04-09 ENCOUNTER — Other Ambulatory Visit (HOSPITAL_COMMUNITY): Payer: Self-pay

## 2021-04-10 ENCOUNTER — Other Ambulatory Visit (HOSPITAL_COMMUNITY): Payer: Self-pay

## 2021-04-11 ENCOUNTER — Other Ambulatory Visit (HOSPITAL_COMMUNITY): Payer: Self-pay

## 2021-04-12 ENCOUNTER — Other Ambulatory Visit (HOSPITAL_COMMUNITY): Payer: Self-pay

## 2021-04-13 ENCOUNTER — Other Ambulatory Visit (HOSPITAL_COMMUNITY): Payer: Self-pay

## 2021-04-13 MED ORDER — NEBIVOLOL HCL 10 MG PO TABS
10.0000 mg | ORAL_TABLET | Freq: Every day | ORAL | 2 refills | Status: DC
  Filled 2021-04-13 – 2021-11-05 (×2): qty 90, 90d supply, fill #0

## 2021-04-13 MED ORDER — MONTELUKAST SODIUM 10 MG PO TABS
10.0000 mg | ORAL_TABLET | Freq: Every day | ORAL | 2 refills | Status: DC
  Filled 2021-04-13 – 2021-04-23 (×2): qty 90, 90d supply, fill #0
  Filled 2021-11-05: qty 90, 90d supply, fill #1
  Filled 2022-04-11: qty 90, 90d supply, fill #2

## 2021-04-18 MED FILL — Rosuvastatin Calcium Tab 5 MG: ORAL | 90 days supply | Qty: 90 | Fill #0 | Status: AC

## 2021-04-19 ENCOUNTER — Other Ambulatory Visit (HOSPITAL_COMMUNITY): Payer: Self-pay

## 2021-04-23 ENCOUNTER — Other Ambulatory Visit (HOSPITAL_COMMUNITY): Payer: Self-pay

## 2021-04-25 ENCOUNTER — Other Ambulatory Visit (HOSPITAL_COMMUNITY): Payer: Self-pay

## 2021-04-25 MED FILL — Hydrochlorothiazide Tab 25 MG: ORAL | 90 days supply | Qty: 90 | Fill #0 | Status: AC

## 2021-04-25 MED FILL — Doxycycline Hyclate Cap 100 MG: ORAL | 30 days supply | Qty: 60 | Fill #1 | Status: AC

## 2021-05-01 ENCOUNTER — Other Ambulatory Visit (HOSPITAL_COMMUNITY): Payer: Self-pay

## 2021-05-01 MED ORDER — HYDROCHLOROTHIAZIDE 25 MG PO TABS
25.0000 mg | ORAL_TABLET | Freq: Every day | ORAL | 3 refills | Status: DC
  Filled 2021-05-01 – 2021-11-05 (×3): qty 90, 90d supply, fill #0
  Filled 2022-04-25: qty 90, 90d supply, fill #1

## 2021-06-05 ENCOUNTER — Other Ambulatory Visit: Payer: Self-pay

## 2021-06-05 ENCOUNTER — Ambulatory Visit: Payer: No Typology Code available for payment source | Admitting: Internal Medicine

## 2021-06-05 VITALS — BP 106/70 | HR 67 | Ht 62.0 in | Wt 192.8 lb

## 2021-06-05 DIAGNOSIS — I10 Essential (primary) hypertension: Secondary | ICD-10-CM | POA: Diagnosis not present

## 2021-06-05 DIAGNOSIS — I493 Ventricular premature depolarization: Secondary | ICD-10-CM

## 2021-06-05 NOTE — Patient Instructions (Signed)

## 2021-06-05 NOTE — Progress Notes (Signed)
HPI Carolyn Phillips returns today for followup. She is a pleasant 45 yo woman with a remote h/o WPW s/p ablation. She developed palpitations and was seen by me and started monitoring her heart with her apple watch. She has had documented PVC's. No chest pain or sob. No syncope. In the interim, she has started flecainide and her symptoms are improved.  She had been losing weight but had gained 5 lbs. Since her last visit. She has had some symptoms of low bp, especially when she bends over.  Allergies  Allergen Reactions   Penicillins Hives and Swelling    Has patient had a PCN reaction causing immediate rash, facial/tongue/throat swelling, SOB or lightheadedness with hypotension: Yes Has patient had a PCN reaction causing severe rash involving mucus membranes or skin necrosis: No Has patient had a PCN reaction that required hospitalization No Has patient had a PCN reaction occurring within the last 10 years: No If all of the above answers are "NO", then may proceed with Cephalosporin use.   Eye swelling   Metoprolol     "LOL" Drugs cause hair to fall out   Nitrofurantoin Macrocrystal Other (See Comments)   Other Nausea And Vomiting    General Anesthesia      Current Outpatient Medications  Medication Sig Dispense Refill   acetaminophen (TYLENOL) 500 MG tablet Take 1,000 mg by mouth every 6 (six) hours as needed for mild pain.     ALPRAZolam (XANAX) 0.25 MG tablet Take 1 tablet (0.25 mg total) by mouth 2 (two) times daily as needed for anxiety. 60 tablet 3   Biotin 2500 MCG CAPS Take 2,500 mcg by mouth daily.      Blood Glucose Monitoring Suppl (FREESTYLE LITE) DEVI Use to check blood sugar 1 time per day. Dx Code: E11.9 1 each 2   cetirizine (ZYRTEC) 10 MG tablet Take 10 mg by mouth daily as needed for allergies.      Cholecalciferol (VITAMIN D3) 3000 UNITS TABS Take 5,000 Units by mouth daily.      COVID-19 At Home Antigen Test [facility] COVID-19 HOME TEST) KIT Use as directed  (Patient taking differently: Use as directed) 4 each 0   Cyanocobalamin (VITAMIN B 12 PO) Take 1 tablet by mouth daily.     cyclobenzaprine (FLEXERIL) 5 MG tablet Take 1 tablet (5 mg total) by mouth 3 (three) times daily as needed for muscle spasms. 60 tablet 0   doxycycline (VIBRAMYCIN) 100 MG capsule TAKE 1 CAPSULE BY MOUTH 2 TIMES DAILY WITH FOOD & WATER 60 capsule 3   Eflornithine HCl 13.9 % cream APPLY TO THE AFFECTED AREA(S) UP TO 2 TIMES DAILY AS NEEDED 45 g 99   empagliflozin (JARDIANCE) 25 MG TABS tablet Take 25 mg by mouth daily. 90 tablet 3   flecainide (TAMBOCOR) 50 MG tablet Take 1 tablet (50 mg total) by mouth 2 (two) times daily. 60 tablet 11   fluticasone (FLONASE) 50 MCG/ACT nasal spray Place 2 sprays into both nostrils daily. 16 g 0   glucose blood (FREESTYLE LITE) test strip Use to check blood sugar 1 time per day. Dx Code: E11.9 100 each 2   hydrochlorothiazide (HYDRODIURIL) 25 MG tablet TAKE 1 TABLET BY MOUTH ONCE DAILY FOR BLOOD PRESSURE AND SWELLING. 90 tablet 2   hydrochlorothiazide (HYDRODIURIL) 25 MG tablet Take 1 tablet (25 mg total) by mouth daily for blood pressure /swelling 90 tablet 3   ibuprofen (ADVIL,MOTRIN) 800 MG tablet Take 1 tablet (800 mg  total) by mouth 3 (three) times daily. 21 tablet 0   Lancets (FREESTYLE) lancets Use to check blood sugar 1 time per day. Dx Code: E11.9 100 each 2   levocetirizine (XYZAL) 5 MG tablet Take 5 mg by mouth at bedtime.     meclizine (ANTIVERT) 25 MG tablet TAKE 1 TABLET ( 25MG) BY MOUTH THREE TIME DAILY AS NEEDED FOR DIZZINESS     methocarbamol (ROBAXIN) 500 MG tablet TAKE 1 TABLET (500MG) BY MOUTH THREE TIME DAILY AS NEEDED FOR MUSCLE SPASM     methylPREDNISolone (MEDROL DOSEPAK) 4 MG TBPK tablet Take as directed on package 21 tablet 0   montelukast (SINGULAIR) 10 MG tablet Take 1 tablet (10 mg total) by mouth daily. 90 tablet 2   Multiple Vitamins-Minerals (ZINC PO) Take 1 tablet by mouth daily.     repaglinide (PRANDIN) 0.5  MG tablet TAKE 1 TABLET (0.5 MG TOTAL) BY MOUTH 2 (TWO) TIMES DAILY BEFORE A MEAL. 180 tablet 3   rosuvastatin (CRESTOR) 5 MG tablet TAKE 1 TABLET BY MOUTH ONCE DAILY 90 tablet 3   Semaglutide (RYBELSUS) 14 MG TABS Take 1 tablet by mouth daily. 90 tablet 3   Spacer/Aero-Holding Chambers (AEROCHAMBER PLUS) inhaler Use as instructed 1 each 2   VITAMIN A PO Take 1 tablet by mouth daily.     nebivolol (BYSTOLIC) 10 MG tablet Take 1 tablet (10 mg total) by mouth at bedtime. (Patient not taking: Reported on 06/05/2021) 90 tablet 2   No current facility-administered medications for this visit.     Past Medical History:  Diagnosis Date   Beta thalassemia trait    Complication of anesthesia    Diabetes mellitus without complication (HCC)    Dysrhythmia    WPW-ablation   GERD (gastroesophageal reflux disease)    Headache    Hyperlipemia    Hypertension    Obesity    PONV (postoperative nausea and vomiting)    Wolff-Parkinson-White syndrome     ROS:   All systems reviewed and negative except as noted in the HPI.   Past Surgical History:  Procedure Laterality Date   ABDOMINAL HYSTERECTOMY     BLADDER REPAIR     BREAST REDUCTION SURGERY     CHONDROPLASTY Left 09/13/2015   Procedure: CHONDROPLASTY;  Surgeon: Frederik Pear, MD;  Location: [facility];  Service: Orthopedics;  Laterality: Left;   KNEE ARTHROSCOPY WITH DRILLING/MICROFRACTURE Left 09/13/2015   Procedure: KNEE ARTHROSCOPY WITH DRILLING/MICROFRACTURE;  Surgeon: Frederik Pear, MD;  Location: [facility];  Service: Orthopedics;  Laterality: Left;   REDUCTION MAMMAPLASTY Bilateral 2001   SUPRAVENTRICULAR TACHYCARDIA ABLATION N/A 07/04/2014   Procedure: WPW ABLATION;  Surgeon: Evans Lance, MD;  Location: [facility] CATH [facility];  Service: Cardiovascular;  Laterality: N/A;   TUBAL LIGATION       Family History  Problem Relation Age of Onset   Valvular heart disease Mother        Aortic valve replacement    Diabetes Mother    Heart attack Maternal Grandmother        53s   Stroke Maternal Grandmother    Diabetes Sister    Breast cancer Sister        61 Sister   Diabetes Brother    Breast cancer Paternal Aunt    Breast cancer Cousin 76     Social History   Socioeconomic History   Marital status: Legally Separated    Spouse name: Not on file   Number of children: 2  Years of education: College   Highest education level: Not on file  Occupational History   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   Caffeine use: small coffee per day   1 mountain dew per day   Social Determinants of Health   Financial Resource Strain: Not on file  Food Insecurity: Not on file  Transportation Needs: Not on file  Physical Activity: Not on file  Stress: Not on file  Social Connections: Not on file  Intimate Partner Violence: Not on file     BP 106/70   Pulse 67   Ht _0  (1.575 m)   Wt 192 lb 12.8 oz (87.5 kg)   SpO2 96%   BMI 35.26 kg/m   Physical Exam:  Well appearing 45 yo woma , NAD HEENT: Unremarkable Neck:  No JVD, no thyromegally Lymphatics:  No adenopathy Back:  No CVA tenderness Lungs:  Clear with no wheezes HEART:  Regular rate rhythm, no murmurs, no rubs, no clicks Abd:  soft, positive bowel sounds, no organomegally, no rebound, no guarding Ext:  2 plus pulses, no edema, no cyanosis, no clubbing Skin:  No rashes no nodules Neuro:  CN II through XII intact, motor grossly intact  EKG - nsr   Assess/Plan:  1. PVC's - her symptoms appear to be improved. She will  continue her beta blocker and low dose flecainide. 2. HTN - her bp today is good. She thinks it has improved since losing weight. At times it may be a little too good.  3. Obesity - she has been losing weight but gained some back. She is encouraged to lose 4. WPW - she is s/p ablation and has no pre-excitation and no recurrent sustained symptoms.   Carleene Overlie Falon Flinchum,MD

## 2021-06-08 ENCOUNTER — Other Ambulatory Visit (HOSPITAL_COMMUNITY): Payer: Self-pay

## 2021-06-11 ENCOUNTER — Other Ambulatory Visit (HOSPITAL_COMMUNITY): Payer: Self-pay

## 2021-06-12 ENCOUNTER — Other Ambulatory Visit (HOSPITAL_COMMUNITY): Payer: Self-pay

## 2021-06-12 MED ORDER — LEVOCETIRIZINE DIHYDROCHLORIDE 5 MG PO TABS
5.0000 mg | ORAL_TABLET | Freq: Every day | ORAL | 3 refills | Status: DC
  Filled 2021-06-12: qty 90, 90d supply, fill #0
  Filled 2022-04-11: qty 90, 90d supply, fill #1

## 2021-06-12 MED ORDER — JARDIANCE 25 MG PO TABS
25.0000 mg | ORAL_TABLET | Freq: Every morning | ORAL | 2 refills | Status: DC
  Filled 2021-06-12: qty 90, 90d supply, fill #0

## 2021-06-13 ENCOUNTER — Other Ambulatory Visit (HOSPITAL_COMMUNITY): Payer: Self-pay

## 2021-06-13 MED ORDER — CARESTART COVID-19 HOME TEST VI KIT
PACK | 0 refills | Status: DC
  Filled 2021-06-13: qty 4, 4d supply, fill #0

## 2021-06-18 ENCOUNTER — Encounter: Payer: Self-pay | Admitting: Orthopaedic Surgery

## 2021-06-19 ENCOUNTER — Encounter: Payer: Self-pay | Admitting: Orthopaedic Surgery

## 2021-06-19 ENCOUNTER — Ambulatory Visit: Payer: No Typology Code available for payment source | Admitting: Orthopaedic Surgery

## 2021-06-19 ENCOUNTER — Other Ambulatory Visit: Payer: Self-pay

## 2021-06-19 ENCOUNTER — Other Ambulatory Visit (HOSPITAL_COMMUNITY): Payer: Self-pay

## 2021-06-19 DIAGNOSIS — G5603 Carpal tunnel syndrome, bilateral upper limbs: Secondary | ICD-10-CM | POA: Diagnosis not present

## 2021-06-19 MED ORDER — METHYLPREDNISOLONE 4 MG PO TBPK
ORAL_TABLET | ORAL | 0 refills | Status: DC
  Filled 2021-06-19: qty 21, 6d supply, fill #0

## 2021-06-19 NOTE — Progress Notes (Signed)
Office Visit Note   Patient: Carolyn Phillips           Date of Birth: 06-May-1976           MRN: [id number] Visit Date: 06/19/2021              Requested by: Alysia Penna, MD [address] [place],  [place] [address] PCP: Alysia Penna, MD   Assessment & Plan: Visit Diagnoses:  1. Carpal tunnel syndrome, bilateral upper limbs     Plan: Carolyn Phillips has known history of right carpal tunnel syndrome based on nerve conduction studies performed in 2018 at [facility].  EMG and nerve conduction studies demonstrate a right mild carpal tunnel syndrome without evidence of radiculopathy or polyneuropathy.  She is diabetic.  She has had a bit more trouble on the left side in the past without prior nerve conduction studies.  I treated that with oral prednisone and seemed to resolve.  On this occasion she has been experiencing numbness and tingling in the radial 3 digits of the right hand and dropping objects.  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  Clinically she has carpal tunnel symptomatic on the right side.  There is positive Tinel's over the median nerve but has good opposition of the thumb to little finger.  She is going to wear the volar wrist splint that she has at home for at least a week and I am going to prescribe a Medrol Dosepak.  If no improvement of the next couple weeks and I did consider local cortisone injection over the right median nerve and order EMGs and nerve conduction studies  Follow-Up Instructions: Return in about 2 weeks (around 07/03/2021).   Orders:  No orders of the defined types were placed in this encounter.  No orders of the defined types were placed in this encounter.     Procedures: No procedures performed   Clinical Data: No additional findings.   Subjective: Chief Complaint  Patient presents with   Right Hand - Numbness  Patient presents today for right hand  numbness. She said that she has difficulty opening her hand in the morning. She has been experiencing "shocks" with certain movements or gripping items. She is right hand dominant. She has been wearing a brace.  Prior EMGs nerve conduction studies performed at [facility] in 2018 demonstrating mild right carpal tunnel.  I have treated her in the past for left carpal tunnel with bracing and oral prednisone.  She is diabetic  HPI  Review of Systems   Objective: Vital Signs: There were no vitals taken for this visit.  Physical Exam Constitutional:      Appearance: She is well-developed.  Pulmonary:     Effort: Pulmonary effort is normal.  Skin:    General: Skin is warm and dry.  Neurological:     Mental Status: She is alert and oriented to person, place, and time.  Psychiatric:        Behavior: Behavior normal.    Ortho Exam awake alert and oriented x3.  Comfortable sitting.  Right hand with tenderness over the median nerve at the wrist and a positive Tinel.  Also positive Phalen's.  Able to oppose thumb the little finger without obvious atrophy of the thenar musculature.  Could make a full fist and release.  Some mild decrease sensibility to the radial 3 digits  Specialty Comments:  No specialty comments available.  Imaging:  No results found.   PMFS History: Patient Active Problem List   Diagnosis Date Noted   Carpal tunnel syndrome, bilateral upper limbs 06/19/2021   Neuropathy, upper extremity 04/04/2021   PVC's (premature ventricular contractions) 02/15/2021   Left knee pain 07/08/2017   Dysuria 10/24/2016   Paresthesias 06/02/2016   Diabetes mellitus without complication (HCC)    Hyperlipemia    Chest pain 01/11/2016   Diabetes mellitus (HCC) 01/11/2016   Dyslipidemia 01/11/2016   Hypokalemia 01/11/2016   Wolff-Parkinson-White syndrome 07/04/2014   Wolff-Parkinson-White (WPW) syndrome 07/04/2014   Hypertension 05/25/2014   WPW (Wolff-Parkinson-White  syndrome) 05/25/2014   Sleep apnea 05/25/2014   Past Medical History:  Diagnosis Date   Beta thalassemia trait    Complication of anesthesia    Diabetes mellitus without complication (HCC)    Dysrhythmia    WPW-ablation   GERD (gastroesophageal reflux disease)    Headache    Hyperlipemia    Hypertension    Obesity    PONV (postoperative nausea and vomiting)    Wolff-Parkinson-White syndrome     Family History  Problem Relation Age of Onset   Valvular heart disease Mother        Aortic valve replacement   Diabetes Mother    Heart attack Maternal Grandmother        21s   Stroke Maternal Grandmother    Diabetes Sister    Breast cancer Sister        Half Sister   Diabetes Brother    Breast cancer Paternal Aunt    Breast cancer Cousin 79    Past Surgical History:  Procedure Laterality Date   ABDOMINAL HYSTERECTOMY     BLADDER REPAIR     BREAST REDUCTION SURGERY     CHONDROPLASTY Left 09/13/2015   Procedure: CHONDROPLASTY;  Surgeon: Gean Birchwood, MD;  Location: [facility];  Service: Orthopedics;  Laterality: Left;   KNEE ARTHROSCOPY WITH DRILLING/MICROFRACTURE Left 09/13/2015   Procedure: KNEE ARTHROSCOPY WITH DRILLING/MICROFRACTURE;  Surgeon: Gean Birchwood, MD;  Location: [facility];  Service: Orthopedics;  Laterality: Left;   REDUCTION MAMMAPLASTY Bilateral 2001   SUPRAVENTRICULAR TACHYCARDIA ABLATION N/A 07/04/2014   Procedure: WPW ABLATION;  Surgeon: Marinus Maw, MD;  Location: [facility] CATH [facility];  Service: Cardiovascular;  Laterality: N/A;   TUBAL LIGATION     Social History   Occupational History   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

## 2021-07-03 ENCOUNTER — Ambulatory Visit: Payer: No Typology Code available for payment source | Admitting: Orthopaedic Surgery

## 2021-07-11 ENCOUNTER — Encounter: Payer: Self-pay | Admitting: Orthopaedic Surgery

## 2021-07-11 ENCOUNTER — Other Ambulatory Visit: Payer: Self-pay

## 2021-07-11 ENCOUNTER — Ambulatory Visit (INDEPENDENT_AMBULATORY_CARE_PROVIDER_SITE_OTHER): Payer: No Typology Code available for payment source | Admitting: Orthopaedic Surgery

## 2021-07-11 VITALS — Ht 62.0 in | Wt 192.0 lb

## 2021-07-11 DIAGNOSIS — G5603 Carpal tunnel syndrome, bilateral upper limbs: Secondary | ICD-10-CM | POA: Diagnosis not present

## 2021-07-11 NOTE — Progress Notes (Signed)
Office Visit Note   Patient: Carolyn Phillips           Date of Birth: [birth year]           MRN: [id number] Visit Date: 07/11/2021              Requested by: Alysia Penna, MD [address] [place],  [place] [address] PCP: Alysia Penna, MD   Assessment & Plan: Visit Diagnoses: No diagnosis found.  Plan: Patient has a history of carpal tunnel syndrome.  In 2018 she had an EMG which was consistent with mild carpal tunnel symptoms.  She did well for quite a while until the last few weeks.  She was seen and evaluated  3 weeks ago by Dr. Cleophas Dunker.  He placed her in a volar splint and also put her on a Depo-Medrol Dosepak.  She did have some elevation of her sugars initially up to 292.  Her fasting blood sugars are currently 125- 150 Will defer a injection today.  We will go forward for repeat EMG studies of the right upper extremity we will follow-up with Dr. Cleophas Dunker once these are completed.  She asked if she could continue to take Aleve she only takes 2-3 over-the-counter Aleve daily.  She does have normal kidney function and does not have any history of ulcers or gastritis.  She knows to take this with food and discontinue it if she has any of the symptoms  Follow-Up Instructions: After EMG is complete  Orders:  No orders of the defined types were placed in this encounter.  No orders of the defined types were placed in this encounter.     Procedures: No procedures performed   Clinical Data: No additional findings.   Subjective: Chief Complaint  Patient presents with   Right Wrist - Follow-up    Right carpal tunnel syndrome  Patient presents today for a three week follow up on her right carpal tunnel syndrome. She started a medrol dose pack at her last visit. She said that she has noticed some improvement, but still hurting. Her sugars are still elevated from the medrol dose pack, but improved some since last week. She has started to take Tizanidine since finishing  the prednisone.  She has gotten some relief from Aleve which she takes sparingly she really has not seen significant improvement.  She is right-hand dominant and works typing quite a bit.  She is using her hands a lot with taking care of her grandchildren    Review of Systems  All other systems reviewed and are negative.   Objective: Vital Signs: There were no vitals taken for this visit.  Physical Exam Constitutional:      Appearance: Normal appearance.  Skin:    General: Skin is warm and dry.  Neurological:     General: No focal deficit present.     Mental Status: She is alert.  Psychiatric:        Mood and Affect: Mood normal.        Behavior: Behavior normal.   Ortho Exam Examination of her right wrist she has an easily palpable radial pulse.  No swelling no erythema.  She does have good opposition of her pinky to her thumb.  She does have a positive Tinel's sign she has sensation changes especially in the thumb middle finger and pinky Specialty Comments:  No specialty comments available.  Imaging: No results found.   PMFS History: Patient Active Problem List   Diagnosis Date Noted  Carpal tunnel syndrome, bilateral upper limbs 06/19/2021   Neuropathy, upper extremity 04/04/2021   PVC's (premature ventricular contractions) 02/15/2021   Left knee pain 07/08/2017   Dysuria 10/24/2016   Paresthesias 06/02/2016   Diabetes mellitus without complication (HCC)    Hyperlipemia    Chest pain 01/11/2016   Diabetes mellitus (HCC) 01/11/2016   Dyslipidemia 01/11/2016   Hypokalemia 01/11/2016   Wolff-Parkinson-White syndrome 07/04/2014   Wolff-Parkinson-White (WPW) syndrome 07/04/2014   Hypertension 05/25/2014   WPW (Wolff-Parkinson-White syndrome) 05/25/2014   Sleep apnea 05/25/2014   Past Medical History:  Diagnosis Date   Beta thalassemia trait    Complication of anesthesia    Diabetes mellitus without complication (HCC)    Dysrhythmia    WPW-ablation   GERD  (gastroesophageal reflux disease)    Headache    Hyperlipemia    Hypertension    Obesity    PONV (postoperative nausea and vomiting)    Wolff-Parkinson-White syndrome     Family History  Problem Relation Age of Onset   Valvular heart disease Mother        Aortic valve replacement   Diabetes Mother    Heart attack Maternal Grandmother        53s   Stroke Maternal Grandmother    Diabetes Sister    Breast cancer Sister        Half Sister   Diabetes Brother    Breast cancer Paternal Aunt    Breast cancer Cousin 7    Past Surgical History:  Procedure Laterality Date   ABDOMINAL HYSTERECTOMY     BLADDER REPAIR     BREAST REDUCTION SURGERY     CHONDROPLASTY Left 09/13/2015   Procedure: CHONDROPLASTY;  Surgeon: Gean Birchwood, MD;  Location: [facility];  Service: Orthopedics;  Laterality: Left;   KNEE ARTHROSCOPY WITH DRILLING/MICROFRACTURE Left 09/13/2015   Procedure: KNEE ARTHROSCOPY WITH DRILLING/MICROFRACTURE;  Surgeon: Gean Birchwood, MD;  Location: [facility];  Service: Orthopedics;  Laterality: Left;   REDUCTION MAMMAPLASTY Bilateral 2001   SUPRAVENTRICULAR TACHYCARDIA ABLATION N/A 07/04/2014   Procedure: WPW ABLATION;  Surgeon: Marinus Maw, MD;  Location: [facility] CATH LAB;  Service: Cardiovascular;  Laterality: N/A;   TUBAL LIGATION     Social History   Occupational History   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

## 2021-08-01 ENCOUNTER — Other Ambulatory Visit (HOSPITAL_COMMUNITY): Payer: Self-pay

## 2021-08-01 MED ORDER — SULFAMETHOXAZOLE-TRIMETHOPRIM 800-160 MG PO TABS
1.0000 | ORAL_TABLET | Freq: Two times a day (BID) | ORAL | 0 refills | Status: DC
Start: 2021-08-01 — End: 2023-10-02
  Filled 2021-08-01: qty 14, 7d supply, fill #0

## 2021-08-07 ENCOUNTER — Other Ambulatory Visit (HOSPITAL_COMMUNITY): Payer: Self-pay

## 2021-08-07 MED ORDER — CLINDAMYCIN HCL 150 MG PO CAPS
150.0000 mg | ORAL_CAPSULE | Freq: Four times a day (QID) | ORAL | 0 refills | Status: DC
  Filled 2021-08-07: qty 40, 10d supply, fill #0

## 2021-08-07 MED ORDER — DOXYCYCLINE HYCLATE 100 MG PO CAPS
100.0000 mg | ORAL_CAPSULE | Freq: Two times a day (BID) | ORAL | 0 refills | Status: DC
  Filled 2021-08-07: qty 60, 30d supply, fill #0

## 2021-08-07 MED ORDER — IBUPROFEN 800 MG PO TABS
800.0000 mg | ORAL_TABLET | Freq: Four times a day (QID) | ORAL | 0 refills | Status: DC | PRN
  Filled 2021-08-07: qty 16, 4d supply, fill #0

## 2021-08-09 ENCOUNTER — Encounter: Payer: Self-pay | Admitting: *Deleted

## 2021-08-20 ENCOUNTER — Other Ambulatory Visit (HOSPITAL_COMMUNITY): Payer: Self-pay

## 2021-08-20 MED FILL — Eflornithine HCl Cream 13.9%: CUTANEOUS | 30 days supply | Qty: 45 | Fill #1 | Status: CN

## 2021-08-29 ENCOUNTER — Other Ambulatory Visit (HOSPITAL_COMMUNITY): Payer: Self-pay

## 2021-09-05 ENCOUNTER — Other Ambulatory Visit (HOSPITAL_COMMUNITY): Payer: Self-pay

## 2021-09-05 ENCOUNTER — Telehealth: Payer: Self-pay | Admitting: Pharmacy Technician

## 2021-09-05 ENCOUNTER — Other Ambulatory Visit: Payer: Self-pay

## 2021-09-05 ENCOUNTER — Ambulatory Visit (INDEPENDENT_AMBULATORY_CARE_PROVIDER_SITE_OTHER): Payer: No Typology Code available for payment source | Admitting: Endocrinology

## 2021-09-05 VITALS — BP 90/60 | HR 74 | Ht 62.0 in | Wt 194.4 lb

## 2021-09-05 DIAGNOSIS — E119 Type 2 diabetes mellitus without complications: Secondary | ICD-10-CM

## 2021-09-05 LAB — POCT GLYCOSYLATED HEMOGLOBIN (HGB A1C): Hemoglobin A1C: 7.1 % — AB (ref 4.0–5.6)

## 2021-09-05 MED ORDER — DEXCOM G6 TRANSMITTER MISC
1.0000 | Freq: Once | 1 refills | Status: AC
  Filled 2021-09-05 – 2021-10-18 (×2): qty 1, 90d supply, fill #0

## 2021-09-05 MED ORDER — DEXCOM G6 RECEIVER DEVI
1.0000 | Freq: Once | 1 refills | Status: AC
  Filled 2021-09-05: qty 1, 90d supply, fill #0

## 2021-09-05 MED ORDER — DEXCOM G6 SENSOR MISC
1.0000 | 3 refills | Status: DC
  Filled 2021-09-05: qty 9, 84d supply, fill #0

## 2021-09-05 MED ORDER — REPAGLINIDE 0.5 MG PO TABS
0.5000 mg | ORAL_TABLET | Freq: Two times a day (BID) | ORAL | 3 refills | Status: DC
  Filled 2021-09-05: qty 180, 90d supply, fill #0

## 2021-09-05 NOTE — Progress Notes (Signed)
Subjective:    Patient ID: Carolyn Phillips, female    DOB: 11-May-1976, 45 y.o.   MRN: [id number]  HPI Pt returns for f/u of diabetes mellitus:  DM type: 2 Dx'ed: 8280 Complications: none.  Therapy: 3 oral meds.   GDM: never.  DKA: never Severe hypoglycemia: never.  Pancreatitis: never.  Other: she has never been on insulin; diarrhea, edema, and vertigo limit oral rx options; She did not tolerate janumet (diarrhea).   Interval history: no cbg record, but states cbg varies from 118-138.  She takes meds as rx'ed.  She sometimes skips the repaglinide at HS, out of fear of hypoglycemia. Past Medical History:  Diagnosis Date   Beta thalassemia trait    Complication of anesthesia    Diabetes mellitus without complication (HCC)    Dysrhythmia    WPW-ablation   GERD (gastroesophageal reflux disease)    Headache    Hyperlipemia    Hypertension    Obesity    PONV (postoperative nausea and vomiting)    Wolff-Parkinson-White syndrome     Past Surgical History:  Procedure Laterality Date   ABDOMINAL HYSTERECTOMY     BLADDER REPAIR     BREAST REDUCTION SURGERY     CHONDROPLASTY Left 09/13/2015   Procedure: CHONDROPLASTY;  Surgeon: Frederik Pear, MD;  Location: [facility];  Service: Orthopedics;  Laterality: Left;   KNEE ARTHROSCOPY WITH DRILLING/MICROFRACTURE Left 09/13/2015   Procedure: KNEE ARTHROSCOPY WITH DRILLING/MICROFRACTURE;  Surgeon: Frederik Pear, MD;  Location: [facility];  Service: Orthopedics;  Laterality: Left;   REDUCTION MAMMAPLASTY Bilateral 2001   SUPRAVENTRICULAR TACHYCARDIA ABLATION N/A 07/04/2014   Procedure: WPW ABLATION;  Surgeon: Evans Lance, MD;  Location: [facility] C[facility] LAB;  Service: Cardiovascular;  Laterality: N/A;   TUBAL LIGATION      Social History   Socioeconomic History   Marital status: Legally Separated    Spouse name: Not on file   Number of children: 2   Years of education: College   Highest education level:  Not on file  Occupational History   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   Caffeine use: small coffee per day   1 mountain dew per day   Social Determinants of Health   Financial Resource Strain: Not on file  Food Insecurity: Not on file  Transportation Needs: Not on file  Physical Activity: Not on file  Stress: Not on file  Social Connections: Not on file  Intimate Partner Violence: Not on file    Current Outpatient Medications on File Prior to Visit  Medication Sig Dispense Refill   acetaminophen (TYLENOL) 500 MG tablet Take 1,000 mg by mouth every 6 (six) hours as needed for mild pain.     ALPRAZolam (XANAX) 0.25 MG tablet Take 1 tablet (0.25 mg total) by mouth 2 (two) times daily as needed for anxiety. 60 tablet 3   Biotin 2500 MCG CAPS Take 2,500 mcg by mouth daily.      Blood Glucose Monitoring Suppl (  FREESTYLE LITE) DEVI Use to check blood sugar 1 time per day. Dx Code: E11.9 1 each 2   cetirizine (ZYRTEC) 10 MG tablet Take 10 mg by mouth daily as needed for allergies.      Cholecalciferol (VITAMIN D3) 3000 UNITS TABS Take 5,000 Units by mouth daily.      clindamycin (CLEOCIN) 150 MG capsule Take 1 capsule (150 mg total) by mouth 4 (four) times daily. 40 capsule 0   COVID-19 At Home Antigen Test (CARESTART COVID-19 HOME TEST) KIT Use as directed (Patient taking differently: Use as directed) 4 each 0   COVID-19 At Home Antigen Test (CARESTART COVID-19 HOME TEST) KIT Use as directed. 4 each 0   Cyanocobalamin (VITAMIN B 12 PO) Take 1 tablet by mouth daily.     cyclobenzaprine  (FLEXERIL) 5 MG tablet Take 1 tablet (5 mg total) by mouth 3 (three) times daily as needed for muscle spasms. 60 tablet 0   doxycycline (VIBRAMYCIN) 100 MG capsule Take 1 capsule (100 mg total) by mouth 2 (two) times daily with food and water 60 capsule 0   empagliflozin (JARDIANCE) 25 MG TABS tablet Take 25 mg by mouth daily. 90 tablet 3   flecainide (TAMBOCOR) 50 MG tablet Take 1 tablet (50 mg total) by mouth 2 (two) times daily. 60 tablet 11   fluticasone (FLONASE) 50 MCG/ACT nasal spray Place 2 sprays into both nostrils daily. 16 g 0   glucose blood (FREESTYLE LITE) test strip Use to check blood sugar 1 time per day. Dx Code: E11.9 100 each 2   hydrochlorothiazide (HYDRODIURIL) 25 MG tablet Take 1 tablet (25 mg total) by mouth daily for blood pressure /swelling 90 tablet 3   ibuprofen (ADVIL) 800 MG tablet Take 1 tablet (800 mg total) by mouth 4 (four) times daily as needed for pain 16 tablet 0   ibuprofen (ADVIL,MOTRIN) 800 MG tablet Take 1 tablet (800 mg total) by mouth 3 (three) times daily. 21 tablet 0   Lancets (FREESTYLE) lancets Use to check blood sugar 1 time per day. Dx Code: E11.9 100 each 2   levocetirizine (XYZAL) 5 MG tablet Take 5 mg by mouth at bedtime.     levocetirizine (XYZAL) 5 MG tablet Take 1 tablet (5 mg total) by mouth at bedtime. 90 tablet 3   meclizine (ANTIVERT) 25 MG tablet TAKE 1 TABLET ( 25MG) BY MOUTH THREE TIME DAILY AS NEEDED FOR DIZZINESS     methocarbamol (ROBAXIN) 500 MG tablet TAKE 1 TABLET (500MG) BY MOUTH THREE TIME DAILY AS NEEDED FOR MUSCLE SPASM     methylPREDNISolone (MEDROL DOSEPAK) 4 MG TBPK tablet Take as directed on package. 21 tablet 0   montelukast (SINGULAIR) 10 MG tablet Take 1 tablet (10 mg total) by mouth daily. 90 tablet 2   Multiple Vitamins-Minerals (ZINC PO) Take 1 tablet by mouth daily.     nebivolol (BYSTOLIC) 10 MG tablet Take 1 tablet (10 mg total) by mouth at bedtime. 90 tablet 2   Semaglutide (RYBELSUS) 14 MG TABS Take 1 tablet by  mouth daily. 90 tablet 3   Spacer/Aero-Holding Chambers (AEROCHAMBER PLUS) inhaler Use as instructed 1 each 2   sulfamethoxazole-trimethoprim (BACTRIM DS) 800-160 MG tablet Take 1 tablet by mouth 2 (two) times daily for 7 days. 14 tablet 0   VITAMIN A PO Take 1 tablet by mouth daily.     hydrochlorothiazide (HYDRODIURIL) 25 MG tablet TAKE 1 TABLET BY MOUTH ONCE DAILY FOR BLOOD PRESSURE AND SWELLING. 90 tablet 2  rosuvastatin (CRESTOR) 5 MG tablet TAKE 1 TABLET BY MOUTH ONCE DAILY 90 tablet 3   No current facility-administered medications on file prior to visit.    Allergies  Allergen Reactions   Penicillins Hives and Swelling    Has patient had a PCN reaction causing immediate rash, facial/tongue/throat swelling, SOB or lightheadedness with hypotension: Yes Has patient had a PCN reaction causing severe rash involving mucus membranes or skin necrosis: No Has patient had a PCN reaction that required hospitalization No Has patient had a PCN reaction occurring within the last 10 years: No If all of the above answers are "NO", then may proceed with Cephalosporin use.   Eye swelling   Metoprolol     "LOL" Drugs cause hair to fall out   Nitrofurantoin Macrocrystal Other (See Comments)   Other Nausea And Vomiting    General Anesthesia     Family History  Problem Relation Age of Onset   Valvular heart disease Mother        Aortic valve replacement   Diabetes Mother    Heart attack Maternal Grandmother        48s   Stroke Maternal Grandmother    Diabetes Sister    Breast cancer Sister        24 Sister   Diabetes Brother    Breast cancer Paternal Aunt    Breast cancer Cousin 100    BP 90/60   Pulse 74   Ht 5' 2"  (1.575 m)   Wt 194 lb 6.4 oz (88.2 kg)   SpO2 95%   BMI 35.56 kg/m    Review of Systems     Objective:   Physical Exam   Lab Results  Component Value Date   HGBA1C 7.1 (A) 09/05/2021      Assessment & Plan:  Type 2 DM: uncontrolled.  Patient  Instructions  Your blood pressure is a little today.  Please see your primary care provider soon, to have it rechecked.   You should take the repaglinide with breakfast and supper, no matter what your blood sugar is.   Please continue the same other diabetes meds.   I have sent a prescription to your pharmacy, for the continuous glucose monitor supplies check your blood sugar once a day.  vary the time of day when you check, between before the 3 meals, and at bedtime.  also check if you have symptoms of your blood sugar being too high or too low.  please keep a record of the readings and bring it to your next appointment here (or you can bring the meter itself).  You can write it on any piece of paper.  please call us sooner if your blood sugar goes below 70, or if you have a lot of readings over 200. Please come back for a follow-up appointment in 6 months.

## 2021-09-05 NOTE — Patient Instructions (Addendum)
Your blood pressure is a little today.  Please see your primary care provider soon, to have it rechecked.   You should take the repaglinide with breakfast and supper, no matter what your blood sugar is.   Please continue the same other diabetes meds.   I have sent a prescription to your pharmacy, for the continuous glucose monitor supplies check your blood sugar once a day.  vary the time of day when you check, between before the 3 meals, and at bedtime.  also check if you have symptoms of your blood sugar being too high or too low.  please keep a record of the readings and bring it to your next appointment here (or you can bring the meter itself).  You can write it on any piece of paper.  please call us sooner if your blood sugar goes below 70, or if you have a lot of readings over 200. Please come back for a follow-up appointment in 6 months.

## 2021-09-05 NOTE — Telephone Encounter (Signed)
Patient Advocate Encounter  Received notification from [employer] [facility]) that prior authorization for [facility] G6 TRANSMITTER is required.   PA submitted on 12.7.22 TRANSMITTER Key [id number] SENSOR: [id number] RECEIVER: [id number] Status is pending   [facility] will continue to follow  Ricke Hey, CPhT Patient Advocate [facility] Phone: [phone] Fax:  [phone]

## 2021-09-06 ENCOUNTER — Other Ambulatory Visit (HOSPITAL_COMMUNITY): Payer: Self-pay

## 2021-09-06 LAB — HM DIABETES EYE EXAM

## 2021-09-10 NOTE — Telephone Encounter (Signed)
Patient Advocate Encounter  Received notification from [employer] that the request for prior authorization for Dexcom has been denied due to Not meeting ins requirements.     See denial letter for details. Pt isn't being treated with insulin.

## 2021-09-13 ENCOUNTER — Other Ambulatory Visit (HOSPITAL_COMMUNITY): Payer: Self-pay

## 2021-09-13 MED ORDER — CARESTART COVID-19 HOME TEST VI KIT
PACK | 0 refills | Status: DC
Start: 2021-09-13 — End: 2021-10-23
  Filled 2021-09-13: qty 4, 4d supply, fill #0

## 2021-09-14 ENCOUNTER — Other Ambulatory Visit (HOSPITAL_COMMUNITY): Payer: Self-pay

## 2021-09-18 ENCOUNTER — Other Ambulatory Visit (HOSPITAL_COMMUNITY): Payer: Self-pay

## 2021-09-20 ENCOUNTER — Other Ambulatory Visit (HOSPITAL_COMMUNITY): Payer: Self-pay

## 2021-10-16 ENCOUNTER — Other Ambulatory Visit (HOSPITAL_COMMUNITY): Payer: Self-pay

## 2021-10-16 MED ORDER — FAMOTIDINE 20 MG PO TABS
20.0000 mg | ORAL_TABLET | Freq: Two times a day (BID) | ORAL | 1 refills | Status: DC | PRN
  Filled 2021-10-16: qty 60, 30d supply, fill #0
  Filled 2022-10-02 – 2022-10-04 (×2): qty 60, 30d supply, fill #1

## 2021-10-16 MED ORDER — PANTOPRAZOLE SODIUM 40 MG PO TBEC
DELAYED_RELEASE_TABLET | ORAL | 2 refills | Status: DC
  Filled 2021-10-16: qty 60, 30d supply, fill #0
  Filled 2022-07-07: qty 60, 30d supply, fill #1

## 2021-10-17 ENCOUNTER — Other Ambulatory Visit: Payer: Self-pay | Admitting: Family Medicine

## 2021-10-17 DIAGNOSIS — R1011 Right upper quadrant pain: Secondary | ICD-10-CM

## 2021-10-18 ENCOUNTER — Other Ambulatory Visit: Payer: Self-pay

## 2021-10-18 ENCOUNTER — Ambulatory Visit
Admission: RE | Admit: 2021-10-18 | Discharge: 2021-10-18 | Disposition: A | Discharge status: Home / Self Care | Payer: No Typology Code available for payment source | Source: Ambulatory Visit | Attending: Family Medicine | Admitting: Family Medicine

## 2021-10-18 ENCOUNTER — Other Ambulatory Visit (HOSPITAL_COMMUNITY): Payer: Self-pay

## 2021-10-18 DIAGNOSIS — R1011 Right upper quadrant pain: Secondary | ICD-10-CM

## 2021-10-19 ENCOUNTER — Ambulatory Visit: Payer: Self-pay | Admitting: Surgery

## 2021-10-19 ENCOUNTER — Other Ambulatory Visit (HOSPITAL_COMMUNITY): Payer: Self-pay

## 2021-10-19 DIAGNOSIS — R1011 Right upper quadrant pain: Secondary | ICD-10-CM | POA: Insufficient documentation

## 2021-10-19 DIAGNOSIS — K219 Gastro-esophageal reflux disease without esophagitis: Secondary | ICD-10-CM | POA: Insufficient documentation

## 2021-10-19 MED ORDER — ONDANSETRON 4 MG PO TBDP
4.0000 mg | ORAL_TABLET | Freq: Three times a day (TID) | ORAL | 0 refills | Status: DC | PRN
  Filled 2021-10-19: qty 20, 7d supply, fill #0

## 2021-10-19 MED ORDER — CIPROFLOXACIN IN D5W 400 MG/200ML IV SOLN
400.0000 mg | Freq: Once | INTRAVENOUS | Status: DC
Start: 2021-10-19 — End: 2023-10-02

## 2021-10-19 MED ORDER — OXYCODONE HCL 5 MG PO TABS
5.0000 mg | ORAL_TABLET | Freq: Four times a day (QID) | ORAL | 0 refills | Status: DC | PRN
  Filled 2021-10-19: qty 20, 5d supply, fill #0

## 2021-10-19 NOTE — H&P (View-Only) (Signed)
Subjective   Chief Complaint: Cholelithiasis     History of Present Illness: Carolyn Phillips is a 46 y.o. female who is seen today as an office consultation at the request of Dr. Ardeth Perfect for evaluation of Cholelithiasis .   This is a 46 year old female who presents with a 1 month history of intermittent right upper quadrant and right flank abdominal pain.  Over the last week, her symptoms have become more severe and more frequent.  Now she has persistent discomfort.  She does have some associated nausea and one episode of vomiting.  No diarrhea.  She does have abdominal bloating.  She was examined in the office 2 days ago and was noted to have some right upper quadrant abdominal pain.  CBC and liver function test were within normal limits.  Ultrasound was obtained yesterday that showed gallstones with possible signs of early acute cholecystitis.  Review of Systems: A complete review of systems was obtained from the patient.  I have reviewed this information and discussed as appropriate with the patient.  See HPI as well for other ROS.  Review of Systems  Constitutional: Negative.   HENT: Negative.   Eyes: Negative.   Respiratory: Negative.   Cardiovascular: Negative.   Gastrointestinal: Positive for abdominal pain, nausea and vomiting.  Genitourinary: Negative.   Musculoskeletal: Negative.   Skin: Negative.   Neurological: Negative.   Endo/Heme/Allergies: Negative.   Psychiatric/Behavioral: Negative.       Medical History: Past Medical History:  Diagnosis Date   Anemia    Anxiety    Arrhythmia    Arthritis    Diabetes mellitus without complication (CMS-HCC)    GERD (gastroesophageal reflux disease)    Hyperlipidemia    Hypertension     Patient Active Problem List  Diagnosis   Acute cholecystitis   Atherosclerotic heart disease of native coronary artery without angina pectoris   Type 2 diabetes mellitus with other specified complication (CMS-HCC)   Dyslipidemia    Essential hypertension   Gastroesophageal reflux disease without esophagitis   Morbid obesity (CMS-HCC)   Pre-excitation syndrome   PVC's (premature ventricular contractions)   Right upper quadrant pain    Past Surgical History:  Procedure Laterality Date   ABLATION ARRYTHMIA FOCUS     HYSTERECTOMY     LAPAROSCOPIC TUBAL LIGATION     left knee arthroscopy     REDUCTION MAMMAPLASTY     REPAIR BLADDER EXSTROPHY       Allergies  Allergen Reactions   Penicillins Hives, Other (See Comments), Itching and Swelling    Has patient had a PCN reaction causing immediate rash, facial/tongue/throat swelling, SOB or lightheadedness with hypotension: Yes Has patient had a PCN reaction causing severe rash involving mucus membranes or skin necrosis: No Has patient had a PCN reaction that required hospitalization No Has patient had a PCN reaction occurring within the last 10 years: No If all of the above answers are "NO", then may proceed with Cephalosporin use.   Eye swelling Has patient had a PCN reaction causing immediate rash, facial/tongue/throat swelling, SOB or lightheadedness with hypotension: Yes Has patient had a PCN reaction causing severe rash involving mucus membranes or skin necrosis: No Has patient had a PCN reaction that required hospitalization No Has patient had a PCN reaction occurring within the last 10 years: No If all of the above answers are "NO", then may proceed with Cephalosporin use.   Eye swelling    Metoprolol Other (See Comments)    "LOL" Drugs cause hair to  fall out "LOL" Drugs cause hair to fall out     Current Outpatient Medications on File Prior to Visit  Medication Sig Dispense Refill   levocetirizine (XYZAL) 5 MG tablet Take 5 mg by mouth at bedtime     montelukast (SINGULAIR) 10 mg tablet 1 tablet     nebivoloL (BYSTOLIC) 10 MG tablet Take 10 mg by mouth once daily     repaglinide (PRANDIN) 0.5 MG tablet Take by mouth     semaglutide (RYBELSUS) 14  mg tablet 1 tablet at least 30 minutes before first food, beverage or other oral medicine of the day     flecainide (TAMBOCOR) 50 MG tablet      hydroCHLOROthiazide (HYDRODIURIL) 25 MG tablet TAKE 1 TABLET BY MOUTH ONCE A DAY FOR BLOOD PRESSURE/SWELLING     JARDIANCE 25 mg tablet      rosuvastatin (CRESTOR) 5 MG tablet      No current facility-administered medications on file prior to visit.    Family History  Problem Relation Age of Onset   Obesity Mother    High blood pressure (Hypertension) Mother    Hyperlipidemia (Elevated cholesterol) Mother    Diabetes Mother    Breast cancer Mother    Deep vein thrombosis (DVT or abnormal blood clot formation) Father    High blood pressure (Hypertension) Sister    Hyperlipidemia (Elevated cholesterol) Sister    Diabetes Sister    Diabetes Brother      Social History   Tobacco Use  Smoking Status Former   Types: Cigarettes  Smokeless Tobacco Never     Social History   Socioeconomic History   Marital status: Single  Tobacco Use   Smoking status: Former    Types: Cigarettes   Smokeless tobacco: Never  Vaping Use   Vaping Use: Unknown  Substance and Sexual Activity   Alcohol use: Yes   Drug use: Never    Objective:    Vitals:   10/19/21 1101  BP: (!) 142/90  Pulse: 95  Temp: 36.6 C (97.9 F)  SpO2: 98%  Weight: 87.6 kg (193 lb 3.2 oz)  Height: 157.5 cm (5\' 2" )    Body mass index is 35.34 kg/m.  Physical Exam   Constitutional:  WDWN in NAD, conversant, no obvious deformities; lying in bed comfortably Eyes:  Pupils equal, round; sclera anicteric; moist conjunctiva; no lid lag HENT:  Oral mucosa moist; good dentition  Neck:  No masses palpated, trachea midline; no thyromegaly Lungs:  CTA bilaterally; normal respiratory effort CV:  Regular rate and rhythm; no murmurs; extremities well-perfused with no edema Abd:  +bowel sounds, soft, tender in right upper quadrant no palpable masses, no palpable organomegaly; no  palpable hernias Musc: Normal gait; no apparent clubbing or cyanosis in extremities Lymphatic:  No palpable cervical or axillary lymphadenopathy Skin:  Warm, dry; no sign of jaundice Psychiatric - alert and oriented x 4; calm mood and affect   Labs, Imaging and Diagnostic Testing: CLINICAL DATA:  Abdominal pain, right upper quadrant   EXAM: ULTRASOUND ABDOMEN LIMITED RIGHT UPPER QUADRANT   COMPARISON:  None.   FINDINGS: Gallbladder:   5 mm gallstone is present. Mild wall thickening visualized. Trace pericholecystic fluid. Positive sonographic Murphy sign noted by sonographer.   Common bile duct:   Diameter: 1 mm, normal   Liver:   Increased in parenchymal echogenicity. Heterogeneous likely with areas of focal fatty sparing. Portal vein is patent on color Doppler imaging with normal direction of blood flow towards the liver.  Other: None.   IMPRESSION: Gallstone is present with mild gallbladder wall thickening, trace pericholecystic fluid, and positive sonographic Murphy sign. Acute cholecystitis is a possibility. A nuclear study could be considered for further evaluation.   Probable hepatic steatosis.   These results will be called to the ordering clinician or representative by the Radiologist Assistant, and communication documented in the PACS or [employer].     Electronically Signed   By: Macy Mis M.D.   On: 10/18/2021 09:23    Assessment and Plan:  Diagnoses and all orders for this visit:  Calculus of gallbladder with chronic cholecystitis without obstruction  Other orders -     ondansetron (ZOFRAN-ODT) 4 MG disintegrating tablet; Take 1 tablet (4 mg total) by mouth every 8 (eight) hours as needed for Nausea for up to 7 days -     oxyCODONE (ROXICODONE) 5 MG immediate release tablet; Take 1 tablet (5 mg total) by mouth every 6 (six) hours as needed for Pain     Laparoscopic cholecystectomy with intraoperative cholangiogram.The surgical  procedure has been discussed with the patient.  Potential risks, benefits, alternative treatments, and expected outcomes have been explained.  All of the patient's questions at this time have been answered.  The likelihood of reaching the patient's treatment goal is good.  The patient understand the proposed surgical procedure and wishes to proceed.    Barack Nicodemus Jearld Adjutant, MD  10/19/2021 11:40 AM

## 2021-10-19 NOTE — H&P (Signed)
Subjective  ° °Chief Complaint: Cholelithiasis °  ° ° °History of Present Illness: °Carolyn Phillips is a 45 y.o. female who is seen today as an office consultation at the request of Dr. Holwerda for evaluation of Cholelithiasis °.   °This is a 45-year-old female who presents with a 1 month history of intermittent right upper quadrant and right flank abdominal pain.  Over the last week, her symptoms have become more severe and more frequent.  Now she has persistent discomfort.  She does have some associated nausea and one episode of vomiting.  No diarrhea.  She does have abdominal bloating.  She was examined in the office 2 days ago and was noted to have some right upper quadrant abdominal pain.  CBC and liver function test were within normal limits.  Ultrasound was obtained yesterday that showed gallstones with possible signs of early acute cholecystitis. ° °Review of Systems: °A complete review of systems was obtained from the patient.  I have reviewed this information and discussed as appropriate with the patient.  See HPI as well for other ROS. ° °Review of Systems  °Constitutional: Negative.   °HENT: Negative.   °Eyes: Negative.   °Respiratory: Negative.   °Cardiovascular: Negative.   °Gastrointestinal: Positive for abdominal pain, nausea and vomiting.  °Genitourinary: Negative.   °Musculoskeletal: Negative.   °Skin: Negative.   °Neurological: Negative.   °Endo/Heme/Allergies: Negative.   °Psychiatric/Behavioral: Negative.   °  ° ° °Medical History: °Past Medical History:  °Diagnosis Date  ° Anemia   ° Anxiety   ° Arrhythmia   ° Arthritis   ° Diabetes mellitus without complication (CMS-HCC)   ° GERD (gastroesophageal reflux disease)   ° Hyperlipidemia   ° Hypertension   ° ° °Patient Active Problem List  °Diagnosis  ° Acute cholecystitis  ° Atherosclerotic heart disease of native coronary artery without angina pectoris  ° Type 2 diabetes mellitus with other specified complication (CMS-HCC)  ° Dyslipidemia  °  Essential hypertension  ° Gastroesophageal reflux disease without esophagitis  ° Morbid obesity (CMS-HCC)  ° Pre-excitation syndrome  ° PVC's (premature ventricular contractions)  ° Right upper quadrant pain  ° ° °Past Surgical History:  °Procedure Laterality Date  ° ABLATION ARRYTHMIA FOCUS    ° HYSTERECTOMY    ° LAPAROSCOPIC TUBAL LIGATION    ° left knee arthroscopy    ° REDUCTION MAMMAPLASTY    ° REPAIR BLADDER EXSTROPHY    °  ° °Allergies  °Allergen Reactions  ° Penicillins Hives, Other (See Comments), Itching and Swelling  °  Has patient had a PCN reaction causing immediate rash, facial/tongue/throat swelling, SOB or lightheadedness with hypotension: Yes °Has patient had a PCN reaction causing severe rash involving mucus membranes or skin necrosis: No °Has patient had a PCN reaction that required hospitalization No °Has patient had a PCN reaction occurring within the last 10 years: No °If all of the above answers are "NO", then may proceed with Cephalosporin use. ° ° °Eye swelling °Has patient had a PCN reaction causing immediate rash, facial/tongue/throat swelling, SOB or lightheadedness with hypotension: Yes °Has patient had a PCN reaction causing severe rash involving mucus membranes or skin necrosis: No °Has patient had a PCN reaction that required hospitalization No °Has patient had a PCN reaction occurring within the last 10 years: No °If all of the above answers are "NO", then may proceed with Cephalosporin use. ° ° °Eye swelling °  ° Metoprolol Other (See Comments)  °  "LOL" Drugs cause hair to   fall out °"LOL" Drugs cause hair to fall out °  ° ° °Current Outpatient Medications on File Prior to Visit  °Medication Sig Dispense Refill  ° levocetirizine (XYZAL) 5 MG tablet Take 5 mg by mouth at bedtime    ° montelukast (SINGULAIR) 10 mg tablet 1 tablet    ° nebivoloL (BYSTOLIC) 10 MG tablet Take 10 mg by mouth once daily    ° repaglinide (PRANDIN) 0.5 MG tablet Take by mouth    ° semaglutide (RYBELSUS) 14  mg tablet 1 tablet at least 30 minutes before first food, beverage or other oral medicine of the day    ° flecainide (TAMBOCOR) 50 MG tablet     ° hydroCHLOROthiazide (HYDRODIURIL) 25 MG tablet TAKE 1 TABLET BY MOUTH ONCE A DAY FOR BLOOD PRESSURE/SWELLING    ° JARDIANCE 25 mg tablet     ° rosuvastatin (CRESTOR) 5 MG tablet     ° °No current facility-administered medications on file prior to visit.  ° ° °Family History  °Problem Relation Age of Onset  ° Obesity Mother   ° High blood pressure (Hypertension) Mother   ° Hyperlipidemia (Elevated cholesterol) Mother   ° Diabetes Mother   ° Breast cancer Mother   ° Deep vein thrombosis (DVT or abnormal blood clot formation) Father   ° High blood pressure (Hypertension) Sister   ° Hyperlipidemia (Elevated cholesterol) Sister   ° Diabetes Sister   ° Diabetes Brother   °  ° °Social History  ° °Tobacco Use  °Smoking Status Former  ° Types: Cigarettes  °Smokeless Tobacco Never  °  ° °Social History  ° °Socioeconomic History  ° Marital status: Single  °Tobacco Use  ° Smoking status: Former  °  Types: Cigarettes  ° Smokeless tobacco: Never  °Vaping Use  ° Vaping Use: Unknown  °Substance and Sexual Activity  ° Alcohol use: Yes  ° Drug use: Never  ° ° °Objective:  ° ° °Vitals:  ° 10/19/21 1101  °BP: (!) 142/90  °Pulse: 95  °Temp: 36.6 °C (97.9 °F)  °SpO2: 98%  °Weight: 87.6 kg (193 lb 3.2 oz)  °Height: 157.5 cm (5' 2")  °  °Body mass index is 35.34 kg/m². ° °Physical Exam  ° °Constitutional:  WDWN in NAD, conversant, no obvious deformities; lying in bed comfortably °Eyes:  Pupils equal, round; sclera anicteric; moist conjunctiva; no lid lag °HENT:  Oral mucosa moist; good dentition  °Neck:  No masses palpated, trachea midline; no thyromegaly °Lungs:  CTA bilaterally; normal respiratory effort °CV:  Regular rate and rhythm; no murmurs; extremities well-perfused with no edema °Abd:  +bowel sounds, soft, tender in right upper quadrant no palpable masses, no palpable organomegaly; no  palpable hernias °Musc: Normal gait; no apparent clubbing or cyanosis in extremities °Lymphatic:  No palpable cervical or axillary lymphadenopathy °Skin:  Warm, dry; no sign of jaundice °Psychiatric - alert and oriented x 4; calm mood and affect ° ° °Labs, Imaging and Diagnostic Testing: °CLINICAL DATA:  Abdominal pain, right upper quadrant °  °EXAM: °ULTRASOUND ABDOMEN LIMITED RIGHT UPPER QUADRANT °  °COMPARISON:  None. °  °FINDINGS: °Gallbladder: °  °5 mm gallstone is present. Mild wall thickening visualized. Trace °pericholecystic fluid. Positive sonographic Murphy sign noted by °sonographer. °  °Common bile duct: °  °Diameter: 1 mm, normal °  °Liver: °  °Increased in parenchymal echogenicity. Heterogeneous likely with °areas of focal fatty sparing. Portal vein is patent on color Doppler °imaging with normal direction of blood flow towards the liver. °  °  Other: None. °  °IMPRESSION: °Gallstone is present with mild gallbladder wall thickening, trace °pericholecystic fluid, and positive sonographic Murphy sign. Acute °cholecystitis is a possibility. A nuclear study could be considered °for further evaluation. °  °Probable hepatic steatosis. °  °These results will be called to the ordering clinician or °representative by the Radiologist Assistant, and communication °documented in the PACS or [facility] Dashboard. °  °  °Electronically Signed °  By: Praneil  Patel M.D. °  On: 10/18/2021 09:23 °  ° °Assessment and Plan:  °Diagnoses and all orders for this visit: ° °Calculus of gallbladder with chronic cholecystitis without obstruction ° °Other orders °-     ondansetron (ZOFRAN-ODT) 4 MG disintegrating tablet; Take 1 tablet (4 mg total) by mouth every 8 (eight) hours as needed for Nausea for up to 7 days °-     oxyCODONE (ROXICODONE) 5 MG immediate release tablet; Take 1 tablet (5 mg total) by mouth every 6 (six) hours as needed for Pain ° °  ° °Laparoscopic cholecystectomy with intraoperative cholangiogram.The surgical  procedure has been discussed with the patient.  Potential risks, benefits, alternative treatments, and expected outcomes have been explained.  All of the patient's questions at this time have been answered.  The likelihood of reaching the patient's treatment goal is good.  The patient understand the proposed surgical procedure and wishes to proceed. ° ° ° °Carolyn Crisman KAI Keia Rask, MD  °10/19/2021 °11:40 AM ° °

## 2021-10-21 ENCOUNTER — Other Ambulatory Visit (HOSPITAL_COMMUNITY): Payer: Self-pay

## 2021-10-22 ENCOUNTER — Other Ambulatory Visit (HOSPITAL_COMMUNITY): Payer: Self-pay

## 2021-10-22 ENCOUNTER — Encounter: Payer: Self-pay | Admitting: Internal Medicine

## 2021-10-23 ENCOUNTER — Other Ambulatory Visit (HOSPITAL_COMMUNITY): Payer: Self-pay | Admitting: Pharmacist

## 2021-10-23 ENCOUNTER — Telehealth: Payer: Self-pay | Admitting: *Deleted

## 2021-10-23 ENCOUNTER — Other Ambulatory Visit (HOSPITAL_COMMUNITY): Payer: Self-pay

## 2021-10-23 ENCOUNTER — Encounter (HOSPITAL_COMMUNITY): Payer: Self-pay | Admitting: Surgery

## 2021-10-23 ENCOUNTER — Other Ambulatory Visit: Payer: Self-pay

## 2021-10-23 MED ORDER — CARESTART COVID-19 HOME TEST VI KIT
PACK | 0 refills | Status: DC
  Filled 2021-10-23 – 2021-11-05 (×2): qty 4, 4d supply, fill #0

## 2021-10-23 NOTE — Progress Notes (Signed)
PCP - Dr. Link Snuffer Cardiologist - Dr. Lewayne Bunting  EKG - 06/06/22 Chest x-ray -  ECHO - 09/11/18 Cardiac Cath -  CPAP -   ERAS Protcol - 12:30 COVID TEST- n/a  Anesthesia review: yes  -------------  SDW INSTRUCTIONS:  Your procedure is scheduled on 10/25/21. Please report to [facility] Main Entrance "A" at 1300 P.M., and check in at the Admitting office. Call this number if you have problems the morning of surgery: [phone]   Remember: Do not eat after midnight the night before your surgery  You may drink clear liquids until 1230 the day of your surgery.   Clear liquids allowed are: Water, Non-Citrus Juices (without pulp), Carbonated Beverages, Clear Tea, Black Coffee Only, and Gatorade (do not add anything to drinks)    Medications to take morning of surgery with a sip of water include: acetaminophen (TYLENOL) if needed ALPRAZolam Prudy Feeler) if needed cyclobenzaprine (FLEXERIL) if needed doxycycline (VIBRAMYCIN) famotidine (PEPCID) if needed methocarbamol (ROBAXIN) if needed montelukast (SINGULAIR)  ondansetron (ZOFRAN-ODT) if needed oxyCODONE (OXY IR/ROXICODONE) if needed   ** PLEASE check your blood sugar the morning of your surgery when you wake up and every 2 hours until you get to the Short Stay unit.  If your blood sugar is less than 70 mg/dL, you will need to treat for low blood sugar: Do not take insulin. Treat a low blood sugar (less than 70 mg/dL) with  cup of clear juice (cranberry or apple), 4 glucose tablets, OR glucose gel. Recheck blood sugar in 15 minutes after treatment (to make sure it is greater than 70 mg/dL). If your blood sugar is not greater than 70 mg/dL on recheck, call [phone] for further instructions.  empagliflozin (JARDIANCE)  1/25 none 1/26 none  repaglinide (PRANDIN)  1/25 usual 1/26 none  Semaglutide (RYBELSUS) 1/25 usual 1/26 none   As of today, STOP taking any Aspirin (unless otherwise instructed by your surgeon), Aleve,  Naproxen, Ibuprofen, Motrin, Advil, Goody's, BC's, all herbal medications, fish oil, and all vitamins.    The Morning of Surgery Do not wear jewelry, make-up or nail polish. Do not wear lotions, powders, or perfumes, or deodorant Do not bring valuables to the hospital. [facility] is not responsible for any belongings or valuables.  If you are a smoker, DO NOT Smoke 24 hours prior to surgery  If you wear a CPAP at night please bring your mask the morning of surgery   Remember that you must have someone to transport you home after your surgery, and remain with you for 24 hours if you are discharged the same day.  Please bring cases for contacts, glasses, hearing aids, dentures or bridgework because it cannot be worn into surgery.   Patients discharged the day of surgery will not be allowed to drive home.   Please shower the NIGHT BEFORE/MORNING OF SURGERY (use antibacterial soap like DIAL soap if possible). Wear comfortable clothes the morning of surgery. Oral Hygiene is also important to reduce your risk of infection.  Remember - BRUSH YOUR TEETH THE MORNING OF SURGERY WITH YOUR REGULAR TOOTHPASTE  Patient denies shortness of breath, fever, cough and chest pain.

## 2021-10-23 NOTE — Telephone Encounter (Signed)
° °  Pre-operative Risk Assessment    Patient Name: Carolyn Phillips  DOB: 1976/04/05 MRN: [id number]      Request for Surgical Clearance    Procedure:   Laparoscopic cholecystectomy  Date of Surgery:  Clearance TBD                                 Surgeon:  Dr Manus Rudd Surgeon's Group or Practice Name:  [facility] Phone number:  [phone] Fax number:  [phone] Attn Michel Bickers, LPN   Type of Clearance Requested:   - Medical    Type of Anesthesia:  General   Additional requests/questions:    Veva Holes   10/23/2021, 8:19 AM

## 2021-10-23 NOTE — Telephone Encounter (Signed)
No history of ischemic heart disease.   Left VM.

## 2021-10-24 ENCOUNTER — Other Ambulatory Visit (HOSPITAL_COMMUNITY): Payer: Self-pay

## 2021-10-24 NOTE — Telephone Encounter (Signed)
° °  Name: Carolyn Phillips  DOB: 07/29/1976  MRN: [id number]   Primary Cardiologist: Cristopher Peru, MD  Chart reviewed as part of pre-operative protocol coverage. Patient was contacted 10/24/2021 in reference to pre-operative risk assessment for pending surgery as outlined below.  Lanetra Drexel Iha was last seen on 06/05/21 by Dr. Lovena Le.  Since that day, Jadon Lattie Spannagel has done well. She has a history of WPW s/p ablation. She does not have a history of ischemic heart disease. She should continue flecainide and BB throughout the perioperative period. Recommend telemetry monitoring.   Therefore, based on ACC/AHA guidelines, the patient would be at acceptable risk for the planned procedure without further cardiovascular testing.   The patient was advised that if she develops new symptoms prior to surgery to contact our office to arrange for a follow-up visit, and she verbalized understanding.  I will route this recommendation to the requesting party via Epic fax function and remove from pre-op pool. Please call with questions.  Ledora Bottcher, PA 10/24/2021, 9:57 AM

## 2021-10-24 NOTE — Anesthesia Preprocedure Evaluation (Addendum)
Anesthesia Evaluation  Patient identified by MRN, date of birth, ID band Patient awake    Reviewed: Allergy & Precautions, NPO status , Patient's Chart, lab work & pertinent test results  History of Anesthesia Complications (+) PONV and history of anesthetic complications  Airway Mallampati: II  TM Distance: >3 FB Neck ROM: Full    Dental  (+) Missing,    Pulmonary sleep apnea and Continuous Positive Airway Pressure Ventilation ,    Pulmonary exam normal        Cardiovascular hypertension, Pt. on medications Normal cardiovascular exam     Neuro/Psych negative neurological ROS  negative psych ROS   GI/Hepatic Neg liver ROS, GERD  Medicated and Controlled,  Endo/Other  diabetes, Type 2, Oral Hypoglycemic Agents  Renal/GU negative Renal ROS  negative genitourinary   Musculoskeletal negative musculoskeletal ROS (+)   Abdominal   Peds  Hematology negative hematology ROS (+)   Anesthesia Other Findings Day of surgery medications reviewed with patient.  Reproductive/Obstetrics negative OB ROS                           Anesthesia Physical Anesthesia Plan  ASA: 2  Anesthesia Plan: General   Post-op Pain Management: Tylenol PO (pre-op) and Toradol IV (intra-op)   Induction:   PONV Risk Score and Plan: 4 or greater and Midazolam, Treatment may vary due to age or medical condition, Scopolamine patch - Pre-op, Ondansetron, Dexamethasone, Propofol infusion and TIVA  Airway Management Planned: Oral ETT  Additional Equipment: None  Intra-op Plan:   Post-operative Plan: Extubation in OR  Informed Consent: I have reviewed the patients History and Physical, chart, labs and discussed the procedure including the risks, benefits and alternatives for the proposed anesthesia with the patient or authorized representative who has indicated his/her understanding and acceptance.     Dental advisory  given  Plan Discussed with: CRNA  Anesthesia Plan Comments: (PAT note written 10/24/2021 by Shonna Chock, PA-C. CMP and CBC done on 10/16/21 at [facility] )      Anesthesia Quick Evaluation

## 2021-10-24 NOTE — Progress Notes (Signed)
Anesthesia Chart Review: Carolyn Phillips  Case: [id number] Date/Time: 10/25/21 1515   Procedure: LAPAROSCOPIC CHOLECYSTECTOMY WITH INTRAOPERATIVE CHOLANGIOGRAM   Anesthesia type: General   Pre-op diagnosis: CHRONIC CALCULUS CHOLECYSTITIS   Location: [facility] OR ROOM 09 / [facility]   Surgeons: Donnie Mesa, MD       DISCUSSION: Patient is a 46 year old female scheduled for the above procedure.  History includes never smoker, post-operative N/V, Wolff-Parkinson-White syndrome (s/p ablation AVRT 07/04/14), PVCs (on b-blocker & flecainide 06/05/21), HTN, HLD, DM2, GERD, beta thalassemia trait, hysterectomy.  Preoperative cardiology input outlined by Fabian Sharp, [place] on 10/23/2021, "Patient was contacted 10/24/2021 in reference to pre-operative risk assessment for pending surgery as outlined below.  Linder Drexel Iha was last seen on 06/05/21 by Dr. Lovena Le.  Since that day, Lekita Mindi Akerson has done well. She has a history of WPW s/p ablation. She does not have a history of ischemic heart disease. She should continue flecainide and BB throughout the perioperative period. Recommend telemetry monitoring.    Therefore, based on ACC/AHA guidelines, the patient would be at acceptable risk for the planned procedure without further cardiovascular testing..."  She had a CMP and CBC on 10/16/21 through [facility]. See LABS below.  Anesthesia team to evaluate on the day of surgery.   VS: Ht 5' 2"  (1.575 m)    Wt 86.2 kg    BMI 34.75 kg/m  BP Readings from Last 3 Encounters:  09/05/21 90/60  06/05/21 106/70  02/28/21 120/90   Pulse Readings from Last 3 Encounters:  09/05/21 74  06/05/21 67  03/29/21 66     PROVIDERS: Velna Hatchet, MD is PCP  Cristopher Peru, MD is EP cardiologist Renato Shin, MD is endocrinologist   LABS: For lab day of surgery as indicated.  She did have labs on 10/16/21 at [facility] (can be viewed in [place] 17/23 Summary). Results  of 10/16/21 CMP and CBC included: Glucose 136, BUN 9, creatinine 0.7, sodium 136, potassium 3.6, calcium 9.1, albumin 3.8, AST 15, ALT 20, alkaline phosphatase 68, total bilirubin 0.4, WBC 8.75, hemoglobin 14.2, hematocrit 44.9, platelet count 378. Copy of labs printed for shadow chart.  A1c 7.1% 09/05/2021. (Dr. Loanne Drilling)    IMAGES: [place] Abd (limited RUQ) 10/18/21: IMPRESSION: - Gallstone is present with mild gallbladder wall thickening, trace pericholecystic fluid, and positive sonographic Murphy sign. Acute cholecystitis is a possibility. A nuclear study could be considered for further evaluation. - Probable hepatic steatosis.    EKG: 06/06/21: NSR   CV: Echo 09/11/18: Study Conclusions  - Left ventricle: The cavity size was normal. Wall thickness was    increased in a pattern of mild LVH. Systolic function was    vigorous. The estimated ejection fraction was in the range of 65%    to 70%. Wall motion was normal; there were no regional wall    motion abnormalities. Left ventricular diastolic function    parameters were normal.   Cardiac event monitor 05/31/14-06/29/14: Recurring WCT likely SVT ante-dromic/down accessory pathway - s/p EP study and catheter ablation of AV reentrant tachycardia 07/04/14   Past Medical History:  Diagnosis Date   Beta thalassemia trait    Complication of anesthesia    Diabetes mellitus without complication (HCC)    Dysrhythmia    WPW-ablation   GERD (gastroesophageal reflux disease)    Headache    Hyperlipemia    Hypertension    Obesity    PONV (postoperative nausea and vomiting)    Wolff-Parkinson-White syndrome  Past Surgical History:  Procedure Laterality Date   ABDOMINAL HYSTERECTOMY     BLADDER REPAIR     BREAST REDUCTION SURGERY     CHONDROPLASTY Left 09/13/2015   Procedure: CHONDROPLASTY;  Surgeon: Frederik Pear, MD;  Location: [place];  Service: Orthopedics;  Laterality: Left;   KNEE ARTHROSCOPY WITH  DRILLING/MICROFRACTURE Left 09/13/2015   Procedure: KNEE ARTHROSCOPY WITH DRILLING/MICROFRACTURE;  Surgeon: Frederik Pear, MD;  Location: [place];  Service: Orthopedics;  Laterality: Left;   REDUCTION MAMMAPLASTY Bilateral 2001   SUPRAVENTRICULAR TACHYCARDIA ABLATION N/A 07/04/2014   Procedure: WPW ABLATION;  Surgeon: Evans Lance, MD;  Location: [facility] CATH LAB;  Service: Cardiovascular;  Laterality: N/A;   TUBAL LIGATION      MEDICATIONS: No current facility-administered medications for this encounter.    acetaminophen (TYLENOL) 500 MG tablet   ALPRAZolam (XANAX) 0.25 MG tablet   Biotin 2500 MCG CAPS   Cholecalciferol (VITAMIN D3) 125 MCG (5000 UT) CAPS   cyclobenzaprine (FLEXERIL) 5 MG tablet   doxycycline (VIBRAMYCIN) 100 MG capsule   Eflornithine HCl (VANIQA) 13.9 % cream   empagliflozin (JARDIANCE) 25 MG TABS tablet   famotidine (PEPCID) 20 MG tablet   flecainide (TAMBOCOR) 50 MG tablet   fluticasone (FLONASE) 50 MCG/ACT nasal spray   hydrochlorothiazide (HYDRODIURIL) 25 MG tablet   ibuprofen (ADVIL) 200 MG tablet   levocetirizine (XYZAL) 5 MG tablet   meclizine (ANTIVERT) 25 MG tablet   methocarbamol (ROBAXIN) 500 MG tablet   montelukast (SINGULAIR) 10 MG tablet   Multiple Vitamins-Minerals (ZINC PO)   nebivolol (BYSTOLIC) 10 MG tablet   ondansetron (ZOFRAN-ODT) 4 MG disintegrating tablet   repaglinide (PRANDIN) 0.5 MG tablet   rosuvastatin (CRESTOR) 5 MG tablet   Semaglutide (RYBELSUS) 14 MG TABS   VITAMIN A PO   Blood Glucose Monitoring Suppl (FREESTYLE LITE) DEVI   clindamycin (CLEOCIN) 150 MG capsule   Continuous Blood Gluc Sensor (DEXCOM G6 SENSOR) MISC   COVID-19 At Home Antigen Test (CARESTART COVID-19 HOME TEST) KIT   famotidine (PEPCID) 20 MG tablet   glucose blood (FREESTYLE LITE) test strip   ibuprofen (ADVIL) 800 MG tablet   ibuprofen (ADVIL,MOTRIN) 800 MG tablet   Lancets (FREESTYLE) lancets   oxyCODONE (OXY IR/ROXICODONE) 5 MG immediate  release tablet   pantoprazole (PROTONIX) 40 MG tablet   Spacer/Aero-Holding Chambers (AEROCHAMBER PLUS) inhaler   sulfamethoxazole-trimethoprim (BACTRIM DS) 800-160 MG tablet    ciprofloxacin (CIPRO) IVPB 400 mg    Myra Gianotti, PA-C Surgical Short Stay/Anesthesiology [facility] Phone [phone] [facility] Phone [phone] 10/24/2021 12:42 PM

## 2021-10-25 ENCOUNTER — Encounter (HOSPITAL_COMMUNITY)
Admission: RE | Disposition: A | Discharge status: Home / Self Care | Payer: Self-pay | Source: Home / Self Care | Attending: Surgery

## 2021-10-25 ENCOUNTER — Other Ambulatory Visit: Payer: Self-pay

## 2021-10-25 ENCOUNTER — Encounter (HOSPITAL_COMMUNITY): Payer: Self-pay | Admitting: Surgery

## 2021-10-25 ENCOUNTER — Ambulatory Visit (HOSPITAL_COMMUNITY)
Admission: RE | Admit: 2021-10-25 | Discharge: 2021-10-25 | Disposition: A | Discharge status: Home / Self Care | Payer: No Typology Code available for payment source | Attending: Surgery | Admitting: Surgery

## 2021-10-25 ENCOUNTER — Ambulatory Visit (HOSPITAL_COMMUNITY): Payer: No Typology Code available for payment source

## 2021-10-25 ENCOUNTER — Ambulatory Visit (HOSPITAL_COMMUNITY): Payer: No Typology Code available for payment source | Admitting: Vascular Surgery

## 2021-10-25 DIAGNOSIS — Z7984 Long term (current) use of oral hypoglycemic drugs: Secondary | ICD-10-CM | POA: Insufficient documentation

## 2021-10-25 DIAGNOSIS — K811 Chronic cholecystitis: Secondary | ICD-10-CM | POA: Diagnosis not present

## 2021-10-25 DIAGNOSIS — G4730 Sleep apnea, unspecified: Secondary | ICD-10-CM | POA: Diagnosis not present

## 2021-10-25 DIAGNOSIS — K8020 Calculus of gallbladder without cholecystitis without obstruction: Secondary | ICD-10-CM | POA: Diagnosis present

## 2021-10-25 DIAGNOSIS — Z419 Encounter for procedure for purposes other than remedying health state, unspecified: Secondary | ICD-10-CM

## 2021-10-25 DIAGNOSIS — K219 Gastro-esophageal reflux disease without esophagitis: Secondary | ICD-10-CM | POA: Diagnosis not present

## 2021-10-25 DIAGNOSIS — E119 Type 2 diabetes mellitus without complications: Secondary | ICD-10-CM | POA: Diagnosis not present

## 2021-10-25 DIAGNOSIS — I10 Essential (primary) hypertension: Secondary | ICD-10-CM | POA: Diagnosis not present

## 2021-10-25 HISTORY — PX: CHOLECYSTECTOMY: SHX55

## 2021-10-25 LAB — GLUCOSE, CAPILLARY
Glucose-Capillary: 125 mg/dL — ABNORMAL HIGH (ref 70–99)
Glucose-Capillary: 147 mg/dL — ABNORMAL HIGH (ref 70–99)

## 2021-10-25 SURGERY — LAPAROSCOPIC CHOLECYSTECTOMY WITH INTRAOPERATIVE CHOLANGIOGRAM
Anesthesia: General | Site: Abdomen

## 2021-10-25 MED ORDER — FENTANYL CITRATE (PF) 100 MCG/2ML IJ SOLN
25.0000 ug | INTRAMUSCULAR | Status: DC | PRN
  Administered 2021-10-25: 50 ug via INTRAVENOUS

## 2021-10-25 MED ORDER — SODIUM CHLORIDE 0.9 % IV SOLN
INTRAVENOUS | Status: DC | PRN
  Administered 2021-10-25: 5 mL

## 2021-10-25 MED ORDER — SUGAMMADEX SODIUM 200 MG/2ML IV SOLN
INTRAVENOUS | Status: DC | PRN
Start: 2021-10-25 — End: 2021-10-25
  Administered 2021-10-25: 200 mg via INTRAVENOUS

## 2021-10-25 MED ORDER — SCOPOLAMINE 1 MG/3DAYS TD PT72
MEDICATED_PATCH | TRANSDERMAL | Status: AC
  Filled 2021-10-25: qty 1

## 2021-10-25 MED ORDER — MIDAZOLAM HCL 2 MG/2ML IJ SOLN
INTRAMUSCULAR | Status: AC
  Filled 2021-10-25: qty 2

## 2021-10-25 MED ORDER — ORAL CARE MOUTH RINSE: 15.0000 mL | Freq: Once | OROMUCOSAL | Status: AC

## 2021-10-25 MED ORDER — FENTANYL CITRATE (PF) 250 MCG/5ML IJ SOLN
INTRAMUSCULAR | Status: DC | PRN
  Administered 2021-10-25: 100 ug via INTRAVENOUS
  Administered 2021-10-25 (×3): 50 ug via INTRAVENOUS

## 2021-10-25 MED ORDER — DEXAMETHASONE SODIUM PHOSPHATE 10 MG/ML IJ SOLN
INTRAMUSCULAR | Status: DC | PRN
  Administered 2021-10-25: 5 mg via INTRAVENOUS

## 2021-10-25 MED ORDER — PROPOFOL 500 MG/50ML IV EMUL
INTRAVENOUS | Status: DC | PRN
  Administered 2021-10-25: 150 ug/kg/min via INTRAVENOUS

## 2021-10-25 MED ORDER — CHLORHEXIDINE GLUCONATE CLOTH 2 % EX PADS: 6.0000 | MEDICATED_PAD | Freq: Once | CUTANEOUS | Status: DC

## 2021-10-25 MED ORDER — 0.9 % SODIUM CHLORIDE (POUR BTL) OPTIME
TOPICAL | Status: DC | PRN
  Administered 2021-10-25: 1000 mL

## 2021-10-25 MED ORDER — PROMETHAZINE HCL 25 MG/ML IJ SOLN
INTRAMUSCULAR | Status: AC
  Filled 2021-10-25: qty 1

## 2021-10-25 MED ORDER — SODIUM CHLORIDE (PF) 0.9 % IJ SOLN
INTRAMUSCULAR | Status: AC
  Filled 2021-10-25: qty 10

## 2021-10-25 MED ORDER — SODIUM CHLORIDE 0.9 % IR SOLN
Status: DC | PRN
  Administered 2021-10-25: 1000 mL

## 2021-10-25 MED ORDER — FENTANYL CITRATE (PF) 100 MCG/2ML IJ SOLN
INTRAMUSCULAR | Status: AC
  Filled 2021-10-25: qty 2

## 2021-10-25 MED ORDER — BUPIVACAINE-EPINEPHRINE (PF) 0.25% -1:200000 IJ SOLN
INTRAMUSCULAR | Status: AC
  Filled 2021-10-25: qty 30

## 2021-10-25 MED ORDER — MIDAZOLAM HCL 5 MG/5ML IJ SOLN
INTRAMUSCULAR | Status: DC | PRN
  Administered 2021-10-25: 2 mg via INTRAVENOUS

## 2021-10-25 MED ORDER — CHLORHEXIDINE GLUCONATE 0.12 % MT SOLN: 15.0000 mL | Freq: Once | OROMUCOSAL | Status: AC

## 2021-10-25 MED ORDER — PROPOFOL 10 MG/ML IV BOLUS
INTRAVENOUS | Status: DC | PRN
Start: 2021-10-25 — End: 2021-10-25
  Administered 2021-10-25: 170 mg via INTRAVENOUS

## 2021-10-25 MED ORDER — OXYCODONE HCL 5 MG/5ML PO SOLN: 5.0000 mg | Freq: Once | ORAL | Status: DC | PRN

## 2021-10-25 MED ORDER — FENTANYL CITRATE (PF) 250 MCG/5ML IJ SOLN
INTRAMUSCULAR | Status: AC
  Filled 2021-10-25: qty 5

## 2021-10-25 MED ORDER — BUPIVACAINE-EPINEPHRINE 0.25% -1:200000 IJ SOLN
INTRAMUSCULAR | Status: DC | PRN
  Administered 2021-10-25: 10 mL

## 2021-10-25 MED ORDER — KETOROLAC TROMETHAMINE 30 MG/ML IJ SOLN
INTRAMUSCULAR | Status: DC | PRN
  Administered 2021-10-25: 30 mg via INTRAVENOUS

## 2021-10-25 MED ORDER — ACETAMINOPHEN 500 MG PO TABS
1000.0000 mg | ORAL_TABLET | ORAL | Status: AC
  Administered 2021-10-25: 1000 mg via ORAL
  Filled 2021-10-25: qty 2

## 2021-10-25 MED ORDER — ONDANSETRON HCL 4 MG/2ML IJ SOLN
INTRAMUSCULAR | Status: DC | PRN
  Administered 2021-10-25: 4 mg via INTRAVENOUS

## 2021-10-25 MED ORDER — SCOPOLAMINE 1 MG/3DAYS TD PT72
MEDICATED_PATCH | TRANSDERMAL | Status: DC | PRN
  Administered 2021-10-25: 1 via TRANSDERMAL

## 2021-10-25 MED ORDER — LACTATED RINGERS IV SOLN: INTRAVENOUS | Status: DC

## 2021-10-25 MED ORDER — ROCURONIUM BROMIDE 10 MG/ML (PF) SYRINGE
PREFILLED_SYRINGE | INTRAVENOUS | Status: DC | PRN
  Administered 2021-10-25: 60 mg via INTRAVENOUS

## 2021-10-25 MED ORDER — PROPOFOL 10 MG/ML IV BOLUS
INTRAVENOUS | Status: AC
  Filled 2021-10-25: qty 20

## 2021-10-25 MED ORDER — PROMETHAZINE HCL 25 MG/ML IJ SOLN
6.2500 mg | INTRAMUSCULAR | Status: DC | PRN
  Administered 2021-10-25: 6.25 mg via INTRAVENOUS

## 2021-10-25 MED ORDER — CHLORHEXIDINE GLUCONATE 0.12 % MT SOLN
OROMUCOSAL | Status: AC
  Administered 2021-10-25: 15 mL via OROMUCOSAL
  Filled 2021-10-25: qty 15

## 2021-10-25 MED ORDER — CEFAZOLIN SODIUM-DEXTROSE 2-3 GM-%(50ML) IV SOLR
INTRAVENOUS | Status: DC | PRN
  Administered 2021-10-25: 2 g via INTRAVENOUS

## 2021-10-25 MED ORDER — OXYCODONE HCL 5 MG PO TABS: 5.0000 mg | ORAL_TABLET | Freq: Once | ORAL | Status: DC | PRN

## 2021-10-25 MED ORDER — LIDOCAINE 2% (20 MG/ML) 5 ML SYRINGE
INTRAMUSCULAR | Status: DC | PRN
  Administered 2021-10-25: 100 mg via INTRAVENOUS

## 2021-10-25 MED ORDER — CEFAZOLIN SODIUM 1 G IJ SOLR
INTRAMUSCULAR | Status: AC
  Filled 2021-10-25: qty 20

## 2021-10-25 SURGICAL SUPPLY — 42 items
APPLIER CLIP ROT 10 11.4 M/L (STAPLE) ×2
BAG COUNTER SPONGE SURGICOUNT (BAG) ×2 IMPLANT
BENZOIN TINCTURE PRP APPL 2/3 (GAUZE/BANDAGES/DRESSINGS) ×2 IMPLANT
BLADE CLIPPER SURG (BLADE) IMPLANT
CANISTER SUCT 3000ML PPV (MISCELLANEOUS) ×2 IMPLANT
CHLORAPREP W/TINT 26 (MISCELLANEOUS) ×2 IMPLANT
CLIP APPLIE ROT 10 11.4 M/L (STAPLE) ×1 IMPLANT
COVER MAYO STAND STRL (DRAPES) ×2 IMPLANT
COVER SURGICAL LIGHT HANDLE (MISCELLANEOUS) ×2 IMPLANT
DRAPE C-ARM 42X120 X-RAY (DRAPES) ×2 IMPLANT
DRSG TEGADERM 2-3/8X2-3/4 SM (GAUZE/BANDAGES/DRESSINGS) ×6 IMPLANT
DRSG TEGADERM 4X4.75 (GAUZE/BANDAGES/DRESSINGS) ×2 IMPLANT
ELECT REM PT RETURN 9FT ADLT (ELECTROSURGICAL) ×2
ELECTRODE REM PT RTRN 9FT ADLT (ELECTROSURGICAL) ×1 IMPLANT
GAUZE SPONGE 2X2 8PLY STRL LF (GAUZE/BANDAGES/DRESSINGS) ×1 IMPLANT
GLOVE SURG ENC MOIS LTX SZ7 (GLOVE) ×2 IMPLANT
GLOVE SURG UNDER POLY LF SZ7.5 (GLOVE) ×2 IMPLANT
GOWN STRL REUS W/ TWL LRG LVL3 (GOWN DISPOSABLE) ×3 IMPLANT
GOWN STRL REUS W/TWL LRG LVL3 (GOWN DISPOSABLE) ×3
KIT BASIN OR (CUSTOM PROCEDURE TRAY) ×2 IMPLANT
KIT TURNOVER KIT B (KITS) ×2 IMPLANT
NS IRRIG 1000ML POUR BTL (IV SOLUTION) ×2 IMPLANT
PAD ARMBOARD 7.5X6 YLW CONV (MISCELLANEOUS) ×2 IMPLANT
POUCH RETRIEVAL ECOSAC 10 (ENDOMECHANICALS) IMPLANT
POUCH RETRIEVAL ECOSAC 10MM (ENDOMECHANICALS) ×1
SCISSORS LAP 5X35 DISP (ENDOMECHANICALS) ×2 IMPLANT
SET CHOLANGIOGRAPH 5 50 .035 (SET/KITS/TRAYS/PACK) ×2 IMPLANT
SET IRRIG TUBING LAPAROSCOPIC (IRRIGATION / IRRIGATOR) ×2 IMPLANT
SET TUBE SMOKE EVAC HIGH FLOW (TUBING) ×2 IMPLANT
SLEEVE ENDOPATH XCEL 5M (ENDOMECHANICALS) ×2 IMPLANT
SPECIMEN JAR SMALL (MISCELLANEOUS) ×2 IMPLANT
SPONGE GAUZE 2X2 STER 10/PKG (GAUZE/BANDAGES/DRESSINGS) ×1
STRIP CLOSURE SKIN 1/2X4 (GAUZE/BANDAGES/DRESSINGS) ×2 IMPLANT
SUT MNCRL AB 4-0 PS2 18 (SUTURE) ×2 IMPLANT
SUT VICRYL 0 UR6 27IN ABS (SUTURE) ×1 IMPLANT
TOWEL GREEN STERILE (TOWEL DISPOSABLE) ×2 IMPLANT
TOWEL GREEN STERILE FF (TOWEL DISPOSABLE) ×2 IMPLANT
TRAY LAPAROSCOPIC MC (CUSTOM PROCEDURE TRAY) ×2 IMPLANT
TROCAR XCEL BLUNT TIP 100MML (ENDOMECHANICALS) ×2 IMPLANT
TROCAR XCEL NON-BLD 11X100MML (ENDOMECHANICALS) ×2 IMPLANT
TROCAR XCEL NON-BLD 5MMX100MML (ENDOMECHANICALS) ×2 IMPLANT
WATER STERILE IRR 1000ML POUR (IV SOLUTION) ×2 IMPLANT

## 2021-10-25 NOTE — Discharge Instructions (Signed)
[facility] ______[facility] LAPAROSCOPIC SURGERY: POST OP INSTRUCTIONS Always review your discharge instruction sheet given to you by the facility where your surgery was performed. IF YOU HAVE DISABILITY OR FAMILY LEAVE FORMS, YOU MUST BRING THEM TO THE OFFICE FOR PROCESSING.   DO NOT GIVE THEM TO YOUR DOCTOR.  A prescription for pain medication may be given to you upon discharge.  Take your pain medication as prescribed, if needed.  If narcotic pain medicine is not needed, then you may take acetaminophen (Tylenol) or ibuprofen (Advil) as needed. Take your usually prescribed medications unless otherwise directed. If you need a refill on your pain medication, please contact your pharmacy.  They will contact our office to request authorization. Prescriptions will not be filled after 5pm or on week-ends. You should follow a light diet the first few days after arrival home, such as soup and crackers, etc.  Be sure to include lots of fluids daily. Most patients will experience some swelling and bruising in the area of the incisions.  Ice packs will help.  Swelling and bruising can take several days to resolve.  It is common to experience some constipation if taking pain medication after surgery.  Increasing fluid intake and taking a stool softener (such as Colace) will usually help or prevent this problem from occurring.  A mild laxative (Milk of Magnesia or Miralax) should be taken according to package instructions if there are no bowel movements after 48 hours. Unless discharge instructions indicate otherwise, you may remove your bandages 24-48 hours after surgery, and you may shower at that time.  You may have steri-strips (small skin tapes) in place directly over the incision.  These strips should be left on the skin for 7-10 days.  If your surgeon used skin glue on the incision, you may shower in 24 hours.  The glue will flake off over the next 2-3 weeks.  Any sutures or staples will be  removed at the office during your follow-up visit. ACTIVITIES:  You may resume regular (light) daily activities beginning the next day--such as daily self-care, walking, climbing stairs--gradually increasing activities as tolerated.  You may have sexual intercourse when it is comfortable.  Refrain from any heavy lifting or straining until approved by your doctor. You may drive when you are no longer taking prescription pain medication, you can comfortably wear a seatbelt, and you can safely maneuver your car and apply brakes. RETURN TO WORK:  __________________________________________________________ You should see your doctor in the office for a follow-up appointment approximately 2-3 weeks after your surgery.  Make sure that you call for this appointment within a day or two after you arrive home to insure a convenient appointment time. OTHER INSTRUCTIONS: __________________________________________________________________________________________________________________________ __________________________________________________________________________________________________________________________ WHEN TO CALL YOUR DOCTOR: Fever over 101.0 Inability to urinate Continued bleeding from incision. Increased pain, redness, or drainage from the incision. Increasing abdominal pain  The clinic staff is available to answer your questions during regular business hours.  Please don't hesitate to call and ask to speak to one of the nurses for clinical concerns.  If you have a medical emergency, go to the nearest emergency room or call 911.  A surgeon from [facility] is always on call at the hospital. [address], [address], [place], [place]  [address] ? [address] [address], [place], [place]   [address] [phone] ? [phone] ? FAX [phone] Web site: [email or web address]  

## 2021-10-25 NOTE — Op Note (Signed)
Laparoscopic Cholecystectomy with IOC Procedure Note  Indications: This is a 46 year old female who presents with a 1 month history of intermittent right upper quadrant and right flank abdominal pain.  Over the last week, her symptoms have become more severe and more frequent.  Now she has persistent discomfort.  She does have some associated nausea and one episode of vomiting.  No diarrhea.  She does have abdominal bloating.  She was examined in the office 2 days ago and was noted to have some right upper quadrant abdominal pain.  CBC and liver function test were within normal limits.  Ultrasound was obtained yesterday that showed gallstones with possible signs of early acute cholecystitis.  Pre-operative Diagnosis: Calculus of gallbladder with other cholecystitis, without mention of obstruction  Post-operative Diagnosis: Same  Surgeon: Wynona Luna   Assistants: Berenda Morale, RNFA  Anesthesia: General endotracheal anesthesia  ASA Class: 2  Procedure Details  The patient was seen again in the Holding Room. The risks, benefits, complications, treatment options, and expected outcomes were discussed with the patient. The possibilities of reaction to medication, pulmonary aspiration, perforation of viscus, bleeding, recurrent infection, finding a normal gallbladder, the need for additional procedures, failure to diagnose a condition, the possible need to convert to an open procedure, and creating a complication requiring transfusion or operation were discussed with the patient. The likelihood of improving the patient's symptoms with return to their baseline status is good.  The patient and/or family concurred with the proposed plan, giving informed consent. The site of surgery properly noted. The patient was taken to Operating Room, identified as Carolyn Phillips and the procedure verified as Laparoscopic Cholecystectomy with Intraoperative Cholangiogram. A Time Out was held and the above  information confirmed.  Prior to the induction of general anesthesia, antibiotic prophylaxis was administered. General endotracheal anesthesia was then administered and tolerated well. After the induction, the abdomen was prepped with Chloraprep and draped in the sterile fashion. The patient was positioned in the supine position.  Local anesthetic agent was injected into the skin below the umbilicus and an incision made. We dissected down to the abdominal fascia with blunt dissection.  The fascia was incised vertically and we entered the peritoneal cavity bluntly.  A pursestring suture of 0-Vicryl was placed around the fascial opening.  The Hasson cannula was inserted and secured with the stay suture.  Pneumoperitoneum was then created with CO2 and tolerated well without any adverse changes in the patient's vital signs. An 11-mm port was placed in the subxiphoid position.  Two 5-mm ports were placed in the right upper quadrant. All skin incisions were infiltrated with a local anesthetic agent before making the incision and placing the trocars.   We positioned the patient in reverse Trendelenburg, tilted slightly to the patient's left.  The gallbladder was identified, the fundus grasped and retracted cephalad. There were minimal adhesions to the gallbladder and the wall did not appear acutely inflamed.  The infundibulum was grasped and retracted laterally, exposing the peritoneum overlying the triangle of Calot. This was then divided and exposed in a blunt fashion. A critical view of the cystic duct and cystic artery was obtained.  The cystic duct was clearly identified and bluntly dissected circumferentially. The cystic duct was ligated with a clip distally.   An incision was made in the cystic duct and the [facility] cholangiogram catheter introduced. The catheter was secured using a clip. A cholangiogram was then obtained which showed good visualization of the distal and proximal biliary tree  with no sign of  filling defects or obstruction.  Contrast flowed easily into the duodenum. The catheter was then removed.   The cystic duct was then ligated with clips and divided. The cystic artery was identified, dissected free, ligated with clips and divided as well.   The gallbladder was dissected from the liver bed in retrograde fashion with the electrocautery. The gallbladder was removed and placed in an Eco sac. The liver bed was irrigated and inspected. Hemostasis was achieved with the electrocautery. Copious irrigation was utilized and was repeatedly aspirated until clear.  The gallbladder and Eco sac were then removed through the umbilical port site.  The pursestring suture was used to close the umbilical fascia.    We again inspected the right upper quadrant for hemostasis.  Pneumoperitoneum was released as we removed the trocars.  4-0 Monocryl was used to close the skin.   Benzoin, steri-strips, and clean dressings were applied. The patient was then extubated and brought to the recovery room in stable condition. Instrument, sponge, and needle counts were correct at closure and at the conclusion of the case.   Findings: Cholecystitis with Cholelithiasis  Estimated Blood Loss: Minimal         Drains: none         Specimens: Gallbladder           Complications: None; patient tolerated the procedure well.         Disposition: PACU - hemodynamically stable.         Condition: stable  Carolyn Phillips. Carolyn Skains, MD, [facility]  General Surgery   10/25/2021 12:50 PM

## 2021-10-25 NOTE — Anesthesia Procedure Notes (Signed)
Procedure Name: Intubation Date/Time: 10/25/2021 12:09 PM Performed by: Waynard Edwards, CRNA Pre-anesthesia Checklist: Patient identified, Emergency Drugs available, Suction available and Patient being monitored Patient Re-evaluated:Patient Re-evaluated prior to induction Oxygen Delivery Method: Circle system utilized Preoxygenation: Pre-oxygenation with 100% oxygen Induction Type: IV induction Ventilation: Mask ventilation without difficulty Laryngoscope Size: Miller and 2 Grade View: Grade I Tube type: Oral Tube size: 7.0 mm Number of attempts: 1 Airway Equipment and Method: Stylet Placement Confirmation: ETT inserted through vocal cords under direct vision, positive ETCO2 and breath sounds checked- equal and bilateral Secured at: 22 cm Tube secured with: Tape Dental Injury: Teeth and Oropharynx as per pre-operative assessment

## 2021-10-25 NOTE — Interval H&P Note (Signed)
History and Physical Interval Note:  10/25/2021 11:23 AM  Carolyn Phillips  has presented today for surgery, with the diagnosis of CHRONIC CALCULUS CHOLECYSTITIS.  The various methods of treatment have been discussed with the patient and family. After consideration of risks, benefits and other options for treatment, the patient has consented to  Procedure(s): LAPAROSCOPIC CHOLECYSTECTOMY WITH INTRAOPERATIVE CHOLANGIOGRAM (N/A) as a surgical intervention.  The patient's history has been reviewed, patient examined, no change in status, stable for surgery.  I have reviewed the patient's chart and labs.  Questions were answered to the patient's satisfaction.     Wynona Luna

## 2021-10-25 NOTE — Anesthesia Postprocedure Evaluation (Signed)
Anesthesia Post Note  Patient: Carolyn Phillips  Procedure(s) Performed: LAPAROSCOPIC CHOLECYSTECTOMY WITH INTRAOPERATIVE CHOLANGIOGRAM (Abdomen)     Patient location during evaluation: PACU Anesthesia Type: General Level of consciousness: awake and alert and oriented Pain management: pain level controlled Vital Signs Assessment: post-procedure vital signs reviewed and stable Respiratory status: spontaneous breathing, nonlabored ventilation and respiratory function stable Cardiovascular status: blood pressure returned to baseline Postop Assessment: no apparent nausea or vomiting Anesthetic complications: no   No notable events documented.  Last Vitals:  Vitals:   10/25/21 1422 10/25/21 1440  BP: (!) 145/93 131/88  Pulse: 61 77  Resp: 15 12  Temp:  36.7 C  SpO2: 100% 99%    Last Pain:  Vitals:   10/25/21 1440  TempSrc:   PainSc: 3                  Shanda Howells

## 2021-10-25 NOTE — Transfer of Care (Signed)
Immediate Anesthesia Transfer of Care Note  Patient: Carolyn Phillips  Procedure(s) Performed: LAPAROSCOPIC CHOLECYSTECTOMY WITH INTRAOPERATIVE CHOLANGIOGRAM (Abdomen)  Patient Location: PACU  Anesthesia Type:General  Level of Consciousness: awake, alert , oriented and patient cooperative  Airway & Oxygen Therapy: Patient Spontanous Breathing and Patient connected to nasal cannula oxygen  Post-op Assessment: Report given to RN, Post -op Vital signs reviewed and stable and Patient moving all extremities X 4  Post vital signs: Reviewed and stable  Last Vitals:  Vitals Value Taken Time  BP 130/90 10/25/21 1308  Temp    Pulse 91 10/25/21 1310  Resp 14 10/25/21 1310  SpO2 97 % 10/25/21 1310  Vitals shown include unvalidated device data.  Last Pain:  Vitals:   10/25/21 1135  TempSrc:   PainSc: 0-No pain         Complications: No notable events documented.

## 2021-10-26 ENCOUNTER — Encounter (HOSPITAL_COMMUNITY): Payer: Self-pay | Admitting: Surgery

## 2021-10-29 LAB — SURGICAL PATHOLOGY

## 2021-10-31 ENCOUNTER — Other Ambulatory Visit (HOSPITAL_COMMUNITY): Payer: Self-pay

## 2021-11-01 ENCOUNTER — Other Ambulatory Visit (HOSPITAL_COMMUNITY): Payer: Self-pay

## 2021-11-05 ENCOUNTER — Other Ambulatory Visit (HOSPITAL_COMMUNITY): Payer: Self-pay

## 2021-11-05 MED ORDER — ROSUVASTATIN CALCIUM 5 MG PO TABS
5.0000 mg | ORAL_TABLET | Freq: Every day | ORAL | 3 refills | Status: DC
  Filled 2021-11-05: qty 90, 90d supply, fill #0
  Filled 2022-07-07: qty 90, 90d supply, fill #1

## 2021-11-05 MED ORDER — HYDROCHLOROTHIAZIDE 25 MG PO TABS
25.0000 mg | ORAL_TABLET | Freq: Every day | ORAL | 3 refills | Status: DC
Start: 2021-11-05 — End: 2022-11-11
  Filled 2021-11-05 – 2022-05-07 (×2): qty 90, 90d supply, fill #0
  Filled 2022-06-10 – 2022-08-16 (×3): qty 90, 90d supply, fill #1

## 2021-12-24 ENCOUNTER — Other Ambulatory Visit (HOSPITAL_COMMUNITY): Payer: Self-pay

## 2022-01-01 ENCOUNTER — Other Ambulatory Visit: Payer: Self-pay

## 2022-01-01 ENCOUNTER — Emergency Department (HOSPITAL_COMMUNITY)
Admission: EM | Admit: 2022-01-01 | Discharge: 2022-01-02 | Disposition: A | Discharge status: Home / Self Care | Payer: No Typology Code available for payment source | Attending: Student | Admitting: Student

## 2022-01-01 ENCOUNTER — Encounter (HOSPITAL_COMMUNITY): Payer: Self-pay

## 2022-01-01 ENCOUNTER — Other Ambulatory Visit (HOSPITAL_COMMUNITY): Payer: Self-pay

## 2022-01-01 ENCOUNTER — Emergency Department (HOSPITAL_COMMUNITY): Payer: No Typology Code available for payment source

## 2022-01-01 DIAGNOSIS — R1032 Left lower quadrant pain: Secondary | ICD-10-CM | POA: Insufficient documentation

## 2022-01-01 DIAGNOSIS — I10 Essential (primary) hypertension: Secondary | ICD-10-CM | POA: Insufficient documentation

## 2022-01-01 DIAGNOSIS — K625 Hemorrhage of anus and rectum: Secondary | ICD-10-CM | POA: Insufficient documentation

## 2022-01-01 DIAGNOSIS — Z7984 Long term (current) use of oral hypoglycemic drugs: Secondary | ICD-10-CM | POA: Diagnosis not present

## 2022-01-01 DIAGNOSIS — R197 Diarrhea, unspecified: Secondary | ICD-10-CM | POA: Insufficient documentation

## 2022-01-01 DIAGNOSIS — Z79899 Other long term (current) drug therapy: Secondary | ICD-10-CM | POA: Insufficient documentation

## 2022-01-01 DIAGNOSIS — E119 Type 2 diabetes mellitus without complications: Secondary | ICD-10-CM | POA: Diagnosis not present

## 2022-01-01 LAB — COMPREHENSIVE METABOLIC PANEL
ALT: 20 U/L (ref 0–44)
AST: 17 U/L (ref 15–41)
Albumin: 3.8 g/dL (ref 3.5–5.0)
Alkaline Phosphatase: 64 U/L (ref 38–126)
Anion gap: 7 (ref 5–15)
BUN: 13 mg/dL (ref 6–20)
CO2: 27 mmol/L (ref 22–32)
Calcium: 9.7 mg/dL (ref 8.9–10.3)
Chloride: 104 mmol/L (ref 98–111)
Creatinine, Ser: 0.85 mg/dL (ref 0.44–1.00)
GFR, Estimated: >60 mL/min (ref >60)
Glucose, Bld: 123 mg/dL — ABNORMAL HIGH (ref 70–99)
Potassium: 3.8 mmol/L (ref 3.5–5.1)
Sodium: 138 mmol/L (ref 135–145)
Total Bilirubin: 0.3 mg/dL (ref 0.3–1.2)
Total Protein: 7.2 g/dL (ref 6.5–8.1)

## 2022-01-01 LAB — CBC WITH DIFFERENTIAL/PLATELET
Abs Immature Granulocytes: 0.02 10*3/uL (ref 0.00–0.07)
Basophils Absolute: 0 10*3/uL (ref 0.0–0.1)
Basophils Relative: 1 %
Eosinophils Absolute: 0.1 10*3/uL (ref 0.0–0.5)
Eosinophils Relative: 1 %
HCT: 42.5 % (ref 36.0–46.0)
Hemoglobin: 13.1 g/dL (ref 12.0–15.0)
Immature Granulocytes: 0 %
Lymphocytes Relative: 40 %
Lymphs Abs: 3 10*3/uL (ref 0.7–4.0)
MCH: 23.2 pg — ABNORMAL LOW (ref 26.0–34.0)
MCHC: 30.8 g/dL (ref 30.0–36.0)
MCV: 75.2 fL — ABNORMAL LOW (ref 80.0–100.0)
Monocytes Absolute: 0.4 10*3/uL (ref 0.1–1.0)
Monocytes Relative: 6 %
Neutro Abs: 4 10*3/uL (ref 1.7–7.7)
Neutrophils Relative %: 52 %
Platelets: 329 10*3/uL (ref 150–400)
RBC: 5.65 MIL/uL — ABNORMAL HIGH (ref 3.87–5.11)
RDW: 15.9 % — ABNORMAL HIGH (ref 11.5–15.5)
WBC: 7.7 10*3/uL (ref 4.0–10.5)
nRBC: 0 % (ref 0.0–0.2)

## 2022-01-01 LAB — URINALYSIS, ROUTINE W REFLEX MICROSCOPIC
Bilirubin Urine: NEGATIVE
Glucose, UA: >=500 mg/dL — AB
Hgb urine dipstick: NEGATIVE
Ketones, ur: NEGATIVE mg/dL
Leukocytes,Ua: NEGATIVE
Nitrite: NEGATIVE
Protein, ur: NEGATIVE mg/dL
Specific Gravity, Urine: 1.012 (ref 1.005–1.030)
pH: 5 (ref 5.0–8.0)

## 2022-01-01 LAB — LIPASE, BLOOD: Lipase: 29 U/L (ref 11–51)

## 2022-01-01 MED ORDER — CIPROFLOXACIN HCL 500 MG PO TABS
500.0000 mg | ORAL_TABLET | Freq: Two times a day (BID) | ORAL | 0 refills | Status: DC
Start: 2022-01-01 — End: 2023-02-06
  Filled 2022-01-01: qty 14, 7d supply, fill #0

## 2022-01-01 NOTE — ED Provider Triage Note (Signed)
Emergency Medicine Provider Triage Evaluation Note ? ?Carolyn Phillips , a 46 y.o. female was evaluated in triage.  Pt complains of rectal bleeding and abdominal pain.  Patient believes that she had a bad melon about 5 days ago and has had intermittent abdominal cramping since then.  A few days ago she did have some vomiting, but has not had any episodes since then.  This morning, when she used the restroom, she wiped with tissue paper and noticed blood on the tissue paper.  When she looked down she noticed that her rectum was slowly dripping blood into the bowl.  Patient went to urgent care this morning where she had a rectal exam done and was found to have no fissures, internal or external hemorrhoids.  However patient does have left lower quadrant pain and they treated empirically for suspected diverticulitis with ciprofloxacin or infectious diarrhea.  She was given return precautions and when to come back to the emergency department, and since then patient has 2 episodes of bloody diarrhea prompting her to come to the ED. ? ?Review of Systems  ?Positive:  ?Negative:  ? ?Physical Exam  ?BP 119/81 (BP Location: Left Arm)   Pulse 78   Temp 98.5 ?F (36.9 ?C) (Oral)   Resp 18   SpO2 97%  ?Gen:   Awake, no distress   ?Resp:  Normal effort  ?MSK:   Moves extremities without difficulty  ?Other:  Abdomen is soft, nondistended, tenderness to palpation in the left lower quadrant ? ?Medical Decision Making  ?Medically screening exam initiated at 5:44 PM.  Appropriate orders placed.  Carolyn Phillips was informed that the remainder of the evaluation will be completed by another provider, this initial triage assessment does not replace that evaluation, and the importance of remaining in the ED until their evaluation is complete. ? ? ?  ?Janell Quiet, [place] ?01/01/22 1807 ? ?

## 2022-01-01 NOTE — ED Triage Notes (Signed)
Pt reports rectal bleeding today in her stool and "it felt like vaginal bleeding." She went to Urgent care today for this, hgb was 13.4 and given prescription for Cipro and told to come to ER if symptoms got worse. She got home and had multiple episodes of non-bloody diarrhea, but did notice blood on the toilet paper when she wiped. ?

## 2022-01-01 NOTE — ED Provider Notes (Signed)
?[facility] ?Provider Note ? ? ?CSN: [id number] ?Arrival date & time: 01/01/22  1544 ? ?  ? ?History ? ?Chief Complaint  ?Patient presents with  ? Rectal Bleeding  ? ? ?Carolyn Phillips is a 46 y.o. female with past medical history of diabetes mellitus, hypertension, Wolff-Parkinson-White, hyperlipidemia, GERD.  Presents emergency department with complaint of left lower quadrant abdominal pain, diarrhea, and rectal bleeding.  Patient reports that she started having cramping sensation to the left lower quadrant on Saturday.  Reports that symptoms were intermittent since then.  Patient woke up today with marked worsening of her pain.  Pain does not radiate from left lower quadrant.  Patient reports that she started having diarrhea and blood in stool today as well.  She has seen blood with stool and also had rectal bleeding outside of bowel movements.  Patient denies any recent antibiotic use, or travel. ? ?Patient reports that she went to urgent care earlier today for similar symptoms.  She was started on Cipro due to concern for possible diverticulitis.  Patient states that after returning home she had worsening of rectal bleeding which prompted her to come to the emergency department. ? ?Denies any fever, chills, abdominal distention, melena, vomiting, fatigue, shortness of breath, chest pain, dysuria, hematuria, urinary urgency, vaginal pain, vaginal pain, vaginal discharge. ? ? ?Rectal Bleeding ?Associated symptoms: abdominal pain   ?Associated symptoms: no dizziness, no fever, no light-headedness and no vomiting   ? ?  ? ?Home Medications ?Prior to Admission medications   ?Medication Sig Start Date End Date Taking? Authorizing Provider  ?acetaminophen (TYLENOL) 500 MG tablet Take 1,000 mg by mouth every 6 (six) hours as needed for mild pain.    [provider]  ?ALPRAZolam Duanne Moron) 0.25 MG tablet Take 1 tablet (0.25 mg total) by mouth 2 (two) times daily as  needed for anxiety. 03/01/21     ?Biotin 2500 MCG CAPS Take 2,500 mcg by mouth daily.     [provider]  ?Blood Glucose Monitoring Suppl (FREESTYLE LITE) DEVI Use to check blood sugar 1 time per day. Dx Code: E11.9 11/01/16   Renato Shin, MD  ?Cholecalciferol (VITAMIN D3) 125 MCG (5000 UT) CAPS Take 5,000 Units by mouth daily.     [provider]  ?ciprofloxacin (CIPRO) 500 MG tablet Take 1 tablet (500 mg total) by mouth 2 (two) times daily for 7 days 01/01/22     ?clindamycin (CLEOCIN) 150 MG capsule Take 1 capsule (150 mg total) by mouth 4 (four) times daily. ?Patient not taking: Reported on 10/23/2021 08/07/21     ?Continuous Blood Gluc Sensor (DEXCOM G6 SENSOR) MISC Change every 10 days 09/05/21   Renato Shin, MD  ?COVID-19 At Home Antigen Test [facility] COVID-19 HOME TEST) KIT Use as directed 10/23/21   Jefm Bryant, RPH  ?cyclobenzaprine (FLEXERIL) 5 MG tablet Take 1 tablet (5 mg total) by mouth 3 (three) times daily as needed for muscle spasms. 07/25/19   Jaynee Eagles, PA-C  ?doxycycline (VIBRAMYCIN) 100 MG capsule Take 1 capsule (100 mg total) by mouth 2 (two) times daily with food and water ?Patient taking differently: Take 100 mg by mouth 2 (two) times daily as needed (acne). 08/07/21     ?Eflornithine HCl (VANIQA) 13.9 % cream Apply 1 application topically 2 (two) times daily as needed (acne).    [provider]  ?empagliflozin (JARDIANCE) 25 MG TABS tablet Take 25 mg by mouth daily. 08/25/19   Renato Shin, MD  ?  famotidine (PEPCID) 20 MG tablet Take 1 tablet (20 mg total) by mouth 2 (two) times daily as needed. 10/16/21     ?famotidine (PEPCID) 20 MG tablet Take 20 mg by mouth 2 (two) times daily as needed for heartburn or indigestion.    [provider]  ?flecainide (TAMBOCOR) 50 MG tablet Take 1 tablet (50 mg total) by mouth 2 (two) times daily. 02/15/21   Evans Lance, MD  ?fluticasone Asencion Islam) 50 MCG/ACT nasal spray Place 2 sprays into both nostrils  daily. ?Patient taking differently: Place 2 sprays into both nostrils daily as needed for allergies. 08/03/18   Shella Maxim, NP  ?glucose blood (FREESTYLE LITE) test strip Use to check blood sugar 1 time per day. Dx Code: E11.9 11/01/16   Renato Shin, MD  ?hydrochlorothiazide (HYDRODIURIL) 25 MG tablet Take 1 tablet (25 mg total) by mouth daily for blood pressure /swelling 05/01/21     ?hydrochlorothiazide (HYDRODIURIL) 25 MG tablet Take 1 tablet (25 mg total) by mouth daily for blood pressure/swelling 11/05/21     ?ibuprofen (ADVIL) 200 MG tablet Take 200 mg by mouth every 8 (eight) hours as needed for moderate pain.    [provider]  ?ibuprofen (ADVIL) 800 MG tablet Take 1 tablet (800 mg total) by mouth 4 (four) times daily as needed for pain 08/07/21     ?ibuprofen (ADVIL,MOTRIN) 800 MG tablet Take 1 tablet (800 mg total) by mouth 3 (three) times daily. 11/15/18   Raylene Everts, MD  ?Lancets (FREESTYLE) lancets Use to check blood sugar 1 time per day. Dx Code: E11.9 11/01/16   Renato Shin, MD  ?levocetirizine (XYZAL) 5 MG tablet Take 1 tablet (5 mg total) by mouth at bedtime. 06/12/21     ?meclizine (ANTIVERT) 25 MG tablet TAKE 1 TABLET ( 25MG) BY MOUTH THREE TIME DAILY AS NEEDED FOR DIZZINESS    [provider]  ?methocarbamol (ROBAXIN) 500 MG tablet TAKE 1 TABLET (500MG) BY MOUTH THREE TIME DAILY AS NEEDED FOR MUSCLE SPASM    [provider]  ?montelukast (SINGULAIR) 10 MG tablet Take 1 tablet (10 mg total) by mouth daily. 04/13/21     ?Multiple Vitamins-Minerals (ZINC PO) Take 1 tablet by mouth daily.    [provider]  ?nebivolol (BYSTOLIC) 10 MG tablet Take 1 tablet (10 mg total) by mouth at bedtime. 04/13/21     ?ondansetron (ZOFRAN-ODT) 4 MG disintegrating tablet Take 1 tablet (4 mg total) by mouth every 8 (eight) hours as needed for nausea for up to 7 days 10/19/21     ?oxyCODONE (OXY IR/ROXICODONE) 5 MG immediate release tablet Take 1 tablet (5 mg total) by mouth every  6 (six) hours as needed for pain 10/19/21     ?pantoprazole (PROTONIX) 40 MG tablet Take 1 tablet (40 mg total) by mouth 2 (two) times daily for 30 days, THEN 1 tablet (40 mg total) daily. ?Patient not taking: Reported on 10/23/2021 10/16/21 12/15/21    ?repaglinide (PRANDIN) 0.5 MG tablet Take 1 tablet (0.5 mg total) by mouth 2 (two) times daily before a meal with breakfast and with supper 09/05/21 09/05/22  Renato Shin, MD  ?rosuvastatin (CRESTOR) 5 MG tablet Take 5 mg by mouth daily.    [provider]  ?rosuvastatin (CRESTOR) 5 MG tablet Take 1 tablet (5 mg total) by mouth daily. 11/05/21     ?Semaglutide (RYBELSUS) 14 MG TABS Take 1 tablet by mouth daily. 02/28/21   Renato Shin, MD  ?Spacer/Aero-Holding Chambers (AEROCHAMBER PLUS) inhaler  Use as instructed 10/17/17   Melynda Ripple, MD  ?sulfamethoxazole-trimethoprim (BACTRIM DS) 800-160 MG tablet Take 1 tablet by mouth 2 (two) times daily for 7 days. ?Patient not taking: Reported on 10/23/2021 08/01/21     ?VITAMIN A PO Take 1 tablet by mouth daily.    [provider]  ?   ? ?Allergies    ?Penicillins, Metoprolol, Nitrofurantoin macrocrystal, and Other   ? ?Review of Systems   ?Review of Systems  ?Constitutional:  Negative for chills, fatigue and fever.  ?Eyes:  Negative for visual disturbance.  ?Respiratory:  Negative for shortness of breath.   ?Cardiovascular:  Negative for chest pain.  ?Gastrointestinal:  Positive for abdominal pain, blood in stool, diarrhea, hematochezia and nausea. Negative for abdominal distention, anal bleeding, constipation, rectal pain and vomiting.  ?Genitourinary:  Negative for difficulty urinating, dysuria, flank pain, frequency, genital sores, hematuria, pelvic pain, urgency, vaginal bleeding, vaginal discharge and vaginal pain.  ?Musculoskeletal:  Negative for back pain and neck pain.  ?Skin:  Negative for color change and rash.  ?Neurological:  Negative for dizziness, syncope, light-headedness and headaches.   ?Psychiatric/Behavioral:  Negative for confusion.   ? ?Physical Exam ?Updated Vital Signs ?BP 128/87 (BP Location: Right Arm)   Pulse 67   Temp 98.2 ?F (36.8 ?C) (Oral)   Resp 17   SpO2 100%  ?Physical Exam ?Vitals and nursing

## 2022-01-02 LAB — POC OCCULT BLOOD, ED: Fecal Occult Bld: NEGATIVE

## 2022-01-02 MED ORDER — IOHEXOL 300 MG/ML  SOLN
140.0000 mL | Freq: Once | INTRAMUSCULAR | Status: AC | PRN
  Administered 2022-01-02: 140 mL via INTRAVENOUS

## 2022-01-02 NOTE — Discharge Instructions (Addendum)
You came to the ED today to be evaluated for your rectal bleeding, abdominal pain, and nausea.  The CT scan of your abdomen pelvis showed no signs of diverticulitis or other abnormalities within your abdomen/pelvis.  Due to your reports of rectal bleeding please follow-up closely with your gastroenterologist. ? ?Get help right away if you have: ?New or increased rectal bleeding. ?Black or dark red stools. ?Vomit with blood or something that looks like coffee grounds. ?A fainting episode. ?Severe pain in your rectum. ? ?

## 2022-01-02 NOTE — ED Notes (Signed)
DC instructions reviewed with pt. PT verbalized understanding. Pt DC °

## 2022-01-15 ENCOUNTER — Other Ambulatory Visit (HOSPITAL_COMMUNITY): Payer: Self-pay

## 2022-01-15 MED ORDER — SUTAB 1479-225-188 MG PO TABS
ORAL_TABLET | ORAL | 0 refills | Status: DC
  Filled 2022-01-15: qty 24, 1d supply, fill #0

## 2022-01-15 MED ORDER — HYDROCORTISONE ACETATE 25 MG RE SUPP
25.0000 mg | Freq: Every day | RECTAL | 1 refills | Status: DC
  Filled 2022-01-15: qty 14, 14d supply, fill #0

## 2022-01-16 ENCOUNTER — Other Ambulatory Visit (HOSPITAL_COMMUNITY): Payer: Self-pay

## 2022-01-16 MED ORDER — JARDIANCE 25 MG PO TABS
25.0000 mg | ORAL_TABLET | Freq: Every morning | ORAL | 2 refills | Status: DC
  Filled 2022-01-16: qty 90, 90d supply, fill #0
  Filled 2022-05-24 – 2022-06-10 (×2): qty 90, 90d supply, fill #1
  Filled 2022-10-02 – 2022-10-04 (×2): qty 90, 90d supply, fill #2

## 2022-01-16 MED ORDER — JARDIANCE 25 MG PO TABS
25.0000 mg | ORAL_TABLET | Freq: Every morning | ORAL | 3 refills | Status: DC
  Filled 2022-01-16 – 2023-01-02 (×2): qty 90, 90d supply, fill #0

## 2022-01-18 ENCOUNTER — Other Ambulatory Visit (HOSPITAL_COMMUNITY): Payer: Self-pay

## 2022-01-18 LAB — HM COLONOSCOPY

## 2022-01-18 MED ORDER — HYOSCYAMINE SULFATE SL 0.125 MG SL SUBL
0.1250 mg | SUBLINGUAL_TABLET | Freq: Two times a day (BID) | SUBLINGUAL | 1 refills | Status: DC | PRN
  Filled 2022-01-18: qty 60, 30d supply, fill #0

## 2022-03-04 ENCOUNTER — Other Ambulatory Visit: Payer: Self-pay | Admitting: Internal Medicine

## 2022-03-04 ENCOUNTER — Other Ambulatory Visit (HOSPITAL_COMMUNITY): Payer: Self-pay

## 2022-03-04 MED ORDER — REPAGLINIDE 0.5 MG PO TABS
0.5000 mg | ORAL_TABLET | Freq: Two times a day (BID) | ORAL | 3 refills | Status: DC
  Filled 2022-03-04: qty 180, 90d supply, fill #0

## 2022-03-04 MED ORDER — ALPRAZOLAM 0.25 MG PO TABS
0.2500 mg | ORAL_TABLET | Freq: Two times a day (BID) | ORAL | 1 refills | Status: DC
  Filled 2022-03-04: qty 60, 30d supply, fill #0
  Filled 2022-08-16: qty 60, 30d supply, fill #1

## 2022-03-05 ENCOUNTER — Other Ambulatory Visit (HOSPITAL_COMMUNITY): Payer: Self-pay

## 2022-03-05 MED ORDER — FLECAINIDE ACETATE 50 MG PO TABS
50.0000 mg | ORAL_TABLET | Freq: Two times a day (BID) | ORAL | 3 refills | Status: DC
  Filled 2022-03-05: qty 60, 30d supply, fill #0
  Filled 2022-04-25 – 2022-05-07 (×2): qty 60, 30d supply, fill #1
  Filled 2022-06-10 – 2022-06-26 (×2): qty 60, 30d supply, fill #2
  Filled 2022-08-16: qty 60, 30d supply, fill #3

## 2022-03-07 ENCOUNTER — Ambulatory Visit: Payer: No Typology Code available for payment source | Admitting: Endocrinology

## 2022-03-27 ENCOUNTER — Other Ambulatory Visit (HOSPITAL_COMMUNITY): Payer: Self-pay

## 2022-04-12 ENCOUNTER — Other Ambulatory Visit (HOSPITAL_COMMUNITY): Payer: Self-pay

## 2022-04-23 ENCOUNTER — Other Ambulatory Visit (HOSPITAL_BASED_OUTPATIENT_CLINIC_OR_DEPARTMENT_OTHER): Payer: Self-pay | Admitting: Internal Medicine

## 2022-04-23 ENCOUNTER — Ambulatory Visit (HOSPITAL_BASED_OUTPATIENT_CLINIC_OR_DEPARTMENT_OTHER)
Admission: RE | Admit: 2022-04-23 | Discharge: 2022-04-23 | Disposition: A | Discharge status: Home / Self Care | Payer: No Typology Code available for payment source | Source: Ambulatory Visit | Attending: Internal Medicine | Admitting: Internal Medicine

## 2022-04-23 DIAGNOSIS — M79662 Pain in left lower leg: Secondary | ICD-10-CM | POA: Insufficient documentation

## 2022-04-23 DIAGNOSIS — M7989 Other specified soft tissue disorders: Secondary | ICD-10-CM | POA: Diagnosis present

## 2022-04-25 ENCOUNTER — Other Ambulatory Visit (HOSPITAL_COMMUNITY): Payer: Self-pay

## 2022-05-03 ENCOUNTER — Other Ambulatory Visit (HOSPITAL_COMMUNITY): Payer: Self-pay

## 2022-05-07 ENCOUNTER — Other Ambulatory Visit (HOSPITAL_COMMUNITY): Payer: Self-pay

## 2022-05-08 ENCOUNTER — Other Ambulatory Visit (HOSPITAL_COMMUNITY): Payer: Self-pay

## 2022-05-08 MED ORDER — FREESTYLE LIBRE 3 SENSOR MISC
1.0000 | 6 refills | Status: DC
  Filled 2022-05-08: qty 2, 28d supply, fill #0
  Filled 2022-06-06: qty 2, 28d supply, fill #1
  Filled 2022-07-07: qty 2, 28d supply, fill #2
  Filled 2022-08-16 (×2): qty 2, 28d supply, fill #3
  Filled 2022-09-10 – 2022-09-13 (×2): qty 2, 28d supply, fill #4
  Filled 2022-11-06: qty 2, 28d supply, fill #5
  Filled 2022-12-04: qty 2, 28d supply, fill #6

## 2022-05-24 ENCOUNTER — Other Ambulatory Visit (HOSPITAL_COMMUNITY): Payer: Self-pay

## 2022-06-05 ENCOUNTER — Other Ambulatory Visit (HOSPITAL_COMMUNITY): Payer: Self-pay

## 2022-06-07 ENCOUNTER — Other Ambulatory Visit (HOSPITAL_COMMUNITY): Payer: Self-pay

## 2022-06-10 ENCOUNTER — Other Ambulatory Visit (HOSPITAL_COMMUNITY): Payer: Self-pay

## 2022-06-11 ENCOUNTER — Other Ambulatory Visit (HOSPITAL_COMMUNITY): Payer: Self-pay

## 2022-06-11 MED ORDER — NEBIVOLOL HCL 10 MG PO TABS
10.0000 mg | ORAL_TABLET | Freq: Every day | ORAL | 2 refills | Status: DC
  Filled 2022-06-11 – 2022-06-26 (×2): qty 90, 90d supply, fill #0
  Filled 2022-09-19: qty 90, 90d supply, fill #1
  Filled 2023-01-02: qty 90, 90d supply, fill #2

## 2022-06-19 ENCOUNTER — Other Ambulatory Visit (HOSPITAL_COMMUNITY): Payer: Self-pay

## 2022-06-26 ENCOUNTER — Other Ambulatory Visit (HOSPITAL_COMMUNITY): Payer: Self-pay

## 2022-07-08 ENCOUNTER — Other Ambulatory Visit (HOSPITAL_COMMUNITY): Payer: Self-pay

## 2022-07-22 ENCOUNTER — Other Ambulatory Visit (HOSPITAL_COMMUNITY): Payer: Self-pay

## 2022-07-22 MED ORDER — MONTELUKAST SODIUM 10 MG PO TABS
10.0000 mg | ORAL_TABLET | Freq: Every day | ORAL | 2 refills | Status: DC
  Filled 2022-07-22 – 2022-08-16 (×2): qty 90, 90d supply, fill #0
  Filled 2022-11-20: qty 90, 90d supply, fill #1
  Filled 2023-03-13: qty 90, 90d supply, fill #2

## 2022-07-31 ENCOUNTER — Other Ambulatory Visit (HOSPITAL_COMMUNITY): Payer: Self-pay

## 2022-08-16 ENCOUNTER — Other Ambulatory Visit (HOSPITAL_COMMUNITY): Payer: Self-pay

## 2022-08-16 MED ORDER — LEVOCETIRIZINE DIHYDROCHLORIDE 5 MG PO TABS
5.0000 mg | ORAL_TABLET | Freq: Every day | ORAL | 4 refills | Status: DC
  Filled 2022-08-16 (×2): qty 90, 90d supply, fill #0
  Filled 2022-11-10 – 2022-11-20 (×2): qty 90, 90d supply, fill #1
  Filled 2023-03-13: qty 90, 90d supply, fill #2
  Filled 2023-05-19 – 2023-05-26 (×2): qty 90, 90d supply, fill #3

## 2022-08-17 ENCOUNTER — Other Ambulatory Visit (HOSPITAL_COMMUNITY): Payer: Self-pay

## 2022-08-19 ENCOUNTER — Other Ambulatory Visit (HOSPITAL_COMMUNITY): Payer: Self-pay

## 2022-09-10 ENCOUNTER — Other Ambulatory Visit (HOSPITAL_COMMUNITY): Payer: Self-pay

## 2022-09-10 MED ORDER — SHINGRIX 50 MCG/0.5ML IM SUSR
0.5000 mL | Freq: Once | INTRAMUSCULAR | 1 refills | Status: AC
  Filled 2022-09-10: qty 0.5, 1d supply, fill #0

## 2022-09-11 ENCOUNTER — Other Ambulatory Visit: Payer: Self-pay | Admitting: Internal Medicine

## 2022-09-11 ENCOUNTER — Other Ambulatory Visit (HOSPITAL_COMMUNITY): Payer: Self-pay

## 2022-09-11 DIAGNOSIS — L729 Follicular cyst of the skin and subcutaneous tissue, unspecified: Secondary | ICD-10-CM

## 2022-09-13 ENCOUNTER — Other Ambulatory Visit (HOSPITAL_COMMUNITY): Payer: Self-pay

## 2022-09-18 ENCOUNTER — Other Ambulatory Visit (HOSPITAL_COMMUNITY): Payer: Self-pay

## 2022-09-18 MED ORDER — OSELTAMIVIR PHOSPHATE 75 MG PO CAPS
75.0000 mg | ORAL_CAPSULE | Freq: Two times a day (BID) | ORAL | 0 refills | Status: DC
  Filled 2022-09-18: qty 10, 5d supply, fill #0

## 2022-09-19 ENCOUNTER — Other Ambulatory Visit (HOSPITAL_COMMUNITY): Payer: Self-pay

## 2022-09-20 ENCOUNTER — Other Ambulatory Visit (HOSPITAL_COMMUNITY): Payer: Self-pay

## 2022-09-20 MED ORDER — CLINDAMYCIN HCL 300 MG PO CAPS
300.0000 mg | ORAL_CAPSULE | Freq: Three times a day (TID) | ORAL | 0 refills | Status: DC
  Filled 2022-09-20: qty 30, 10d supply, fill #0

## 2022-09-24 ENCOUNTER — Other Ambulatory Visit (HOSPITAL_COMMUNITY): Payer: Self-pay

## 2022-09-26 ENCOUNTER — Ambulatory Visit
Admission: RE | Admit: 2022-09-26 | Discharge: 2022-09-26 | Disposition: A | Discharge status: Home / Self Care | Payer: No Typology Code available for payment source | Source: Ambulatory Visit | Attending: Internal Medicine | Admitting: Internal Medicine

## 2022-09-26 ENCOUNTER — Other Ambulatory Visit: Payer: Self-pay | Admitting: Internal Medicine

## 2022-09-26 DIAGNOSIS — L729 Follicular cyst of the skin and subcutaneous tissue, unspecified: Secondary | ICD-10-CM

## 2022-10-04 ENCOUNTER — Other Ambulatory Visit (HOSPITAL_COMMUNITY): Payer: Self-pay

## 2022-10-06 ENCOUNTER — Other Ambulatory Visit: Payer: Self-pay | Admitting: Internal Medicine

## 2022-10-07 ENCOUNTER — Other Ambulatory Visit (HOSPITAL_COMMUNITY): Payer: Self-pay

## 2022-10-07 MED ORDER — FLECAINIDE ACETATE 50 MG PO TABS
50.0000 mg | ORAL_TABLET | Freq: Two times a day (BID) | ORAL | 0 refills | Status: DC
  Filled 2022-10-07: qty 60, 30d supply, fill #0

## 2022-10-10 ENCOUNTER — Other Ambulatory Visit (HOSPITAL_COMMUNITY): Payer: Self-pay

## 2022-10-16 ENCOUNTER — Other Ambulatory Visit (HOSPITAL_COMMUNITY): Payer: Self-pay

## 2022-11-07 ENCOUNTER — Other Ambulatory Visit: Payer: Self-pay

## 2022-11-10 ENCOUNTER — Other Ambulatory Visit (HOSPITAL_COMMUNITY): Payer: Self-pay

## 2022-11-10 ENCOUNTER — Other Ambulatory Visit: Payer: Self-pay | Admitting: Internal Medicine

## 2022-11-11 ENCOUNTER — Other Ambulatory Visit (HOSPITAL_COMMUNITY): Payer: Self-pay

## 2022-11-11 MED ORDER — HYDROCHLOROTHIAZIDE 25 MG PO TABS
ORAL_TABLET | ORAL | 3 refills | Status: DC
  Filled 2022-11-11: qty 90, 90d supply, fill #0
  Filled 2023-02-05: qty 90, 90d supply, fill #1
  Filled 2023-03-13 – 2023-05-19 (×2): qty 90, 90d supply, fill #2
  Filled 2023-09-22: qty 90, 90d supply, fill #3

## 2022-11-12 ENCOUNTER — Telehealth: Payer: Self-pay | Admitting: Internal Medicine

## 2022-11-12 ENCOUNTER — Other Ambulatory Visit: Payer: Self-pay

## 2022-11-12 ENCOUNTER — Other Ambulatory Visit (HOSPITAL_COMMUNITY): Payer: Self-pay

## 2022-11-12 MED ORDER — FLECAINIDE ACETATE 50 MG PO TABS
50.0000 mg | ORAL_TABLET | Freq: Two times a day (BID) | ORAL | 3 refills | Status: DC
  Filled 2022-11-12: qty 60, 30d supply, fill #0
  Filled 2023-01-02: qty 60, 30d supply, fill #1
  Filled 2023-02-05: qty 60, 30d supply, fill #2
  Filled 2023-03-13: qty 60, 30d supply, fill #3

## 2022-11-12 NOTE — Telephone Encounter (Signed)
*  STAT* If patient is at the pharmacy, call can be transferred to refill team.   1. Which medications need to be refilled? (please list name of each medication and dose if known) flecainide (TAMBOCOR) 50 MG tablet   2. Which pharmacy/location (including street and city if local pharmacy) is medication to be sent to?  [place]   3. Do they need a 30 day or 90 day supply? 90    Patient has appt oon 5/9 .

## 2022-11-12 NOTE — Telephone Encounter (Signed)
Pt's medication was sent to pt's pharmacy as requested. Confirmation received.  °

## 2022-11-14 ENCOUNTER — Other Ambulatory Visit (HOSPITAL_COMMUNITY): Payer: Self-pay

## 2022-11-19 ENCOUNTER — Other Ambulatory Visit (HOSPITAL_COMMUNITY): Payer: Self-pay

## 2022-11-20 ENCOUNTER — Other Ambulatory Visit (HOSPITAL_COMMUNITY): Payer: Self-pay

## 2022-11-20 ENCOUNTER — Other Ambulatory Visit: Payer: Self-pay

## 2022-11-20 MED ORDER — ROSUVASTATIN CALCIUM 5 MG PO TABS
5.0000 mg | ORAL_TABLET | Freq: Every day | ORAL | 3 refills | Status: DC
  Filled 2022-11-20: qty 90, 90d supply, fill #0
  Filled 2023-02-05: qty 90, 90d supply, fill #1
  Filled 2023-10-19: qty 90, 90d supply, fill #2

## 2022-11-25 ENCOUNTER — Other Ambulatory Visit (HOSPITAL_COMMUNITY): Payer: Self-pay

## 2022-11-25 MED ORDER — RYBELSUS 14 MG PO TABS
14.0000 mg | ORAL_TABLET | Freq: Every day | ORAL | 1 refills | Status: DC
  Filled 2022-11-25 – 2023-01-02 (×2): qty 30, 30d supply, fill #0

## 2022-12-04 ENCOUNTER — Other Ambulatory Visit (HOSPITAL_COMMUNITY): Payer: Self-pay

## 2022-12-04 DIAGNOSIS — E669 Obesity, unspecified: Secondary | ICD-10-CM | POA: Diagnosis not present

## 2022-12-04 DIAGNOSIS — E1169 Type 2 diabetes mellitus with other specified complication: Secondary | ICD-10-CM | POA: Diagnosis not present

## 2022-12-04 DIAGNOSIS — I10 Essential (primary) hypertension: Secondary | ICD-10-CM | POA: Diagnosis not present

## 2022-12-04 DIAGNOSIS — I2510 Atherosclerotic heart disease of native coronary artery without angina pectoris: Secondary | ICD-10-CM | POA: Diagnosis not present

## 2022-12-04 MED ORDER — SOLIQUA 100-33 UNT-MCG/ML ~~LOC~~ SOPN
15.0000 [IU] | PEN_INJECTOR | Freq: Every day | SUBCUTANEOUS | 5 refills | Status: DC
  Filled 2022-12-04: qty 15, 90d supply, fill #0
  Filled 2023-02-05 – 2023-03-13 (×2): qty 15, 90d supply, fill #1
  Filled 2023-05-19 – 2023-05-26 (×2): qty 15, 90d supply, fill #2

## 2022-12-04 MED ORDER — INSULIN PEN NEEDLE 32G X 4 MM MISC
3 refills | Status: DC
  Filled 2022-12-04: qty 100, 90d supply, fill #0
  Filled 2023-08-27: qty 100, 90d supply, fill #1

## 2022-12-05 ENCOUNTER — Other Ambulatory Visit (HOSPITAL_COMMUNITY): Payer: Self-pay

## 2022-12-05 ENCOUNTER — Other Ambulatory Visit: Payer: Self-pay

## 2022-12-05 MED ORDER — ALPRAZOLAM 0.25 MG PO TABS
0.2500 mg | ORAL_TABLET | Freq: Two times a day (BID) | ORAL | 0 refills | Status: DC
  Filled 2022-12-05: qty 30, 15d supply, fill #0

## 2022-12-05 MED ORDER — FAMOTIDINE 20 MG PO TABS
20.0000 mg | ORAL_TABLET | Freq: Two times a day (BID) | ORAL | 1 refills | Status: DC | PRN
  Filled 2022-12-05: qty 60, 30d supply, fill #0
  Filled 2023-04-07 (×2): qty 60, 30d supply, fill #1

## 2022-12-09 ENCOUNTER — Other Ambulatory Visit (HOSPITAL_COMMUNITY): Payer: Self-pay

## 2022-12-10 ENCOUNTER — Other Ambulatory Visit (HOSPITAL_COMMUNITY): Payer: Self-pay

## 2022-12-10 DIAGNOSIS — Z1152 Encounter for screening for COVID-19: Secondary | ICD-10-CM | POA: Diagnosis not present

## 2022-12-10 DIAGNOSIS — R509 Fever, unspecified: Secondary | ICD-10-CM | POA: Diagnosis not present

## 2022-12-10 DIAGNOSIS — J029 Acute pharyngitis, unspecified: Secondary | ICD-10-CM | POA: Diagnosis not present

## 2022-12-10 DIAGNOSIS — J302 Other seasonal allergic rhinitis: Secondary | ICD-10-CM | POA: Diagnosis not present

## 2022-12-10 DIAGNOSIS — E1169 Type 2 diabetes mellitus with other specified complication: Secondary | ICD-10-CM | POA: Diagnosis not present

## 2022-12-10 MED ORDER — CLINDAMYCIN HCL 300 MG PO CAPS
300.0000 mg | ORAL_CAPSULE | Freq: Two times a day (BID) | ORAL | 0 refills | Status: DC
  Filled 2022-12-10: qty 14, 7d supply, fill #0

## 2022-12-11 ENCOUNTER — Other Ambulatory Visit (HOSPITAL_COMMUNITY): Payer: Self-pay

## 2022-12-18 DIAGNOSIS — N3941 Urge incontinence: Secondary | ICD-10-CM | POA: Insufficient documentation

## 2023-01-02 ENCOUNTER — Other Ambulatory Visit: Payer: Self-pay

## 2023-01-02 ENCOUNTER — Other Ambulatory Visit (HOSPITAL_COMMUNITY): Payer: Self-pay

## 2023-01-03 ENCOUNTER — Other Ambulatory Visit (HOSPITAL_COMMUNITY): Payer: Self-pay

## 2023-01-06 ENCOUNTER — Other Ambulatory Visit (HOSPITAL_COMMUNITY): Payer: Self-pay

## 2023-01-06 MED ORDER — FREESTYLE LIBRE 3 SENSOR MISC
5 refills | Status: DC
  Filled 2023-01-06: qty 2, 28d supply, fill #0
  Filled 2023-02-05: qty 2, 28d supply, fill #1
  Filled 2023-03-13: qty 2, 28d supply, fill #2
  Filled 2023-04-07 (×2): qty 2, 28d supply, fill #3
  Filled 2023-04-22: qty 1, 14d supply, fill #4
  Filled 2023-06-25: qty 1, 14d supply, fill #5
  Filled 2023-09-11 – 2023-09-26 (×2): qty 1, 14d supply, fill #6
  Filled 2023-10-24: qty 1, 14d supply, fill #7

## 2023-01-06 MED ORDER — FREESTYLE LIBRE 3 SENSOR MISC
1.0000 | 5 refills | Status: AC
  Filled 2023-05-19: qty 2, 28d supply, fill #0
  Filled 2023-07-10 – 2023-08-08 (×2): qty 2, 28d supply, fill #1
  Filled 2023-10-24: qty 2, 28d supply, fill #2
  Filled 2023-11-14 – 2023-11-17 (×2): qty 2, 28d supply, fill #3

## 2023-01-07 ENCOUNTER — Other Ambulatory Visit (HOSPITAL_COMMUNITY): Payer: Self-pay

## 2023-01-07 MED ORDER — ALPRAZOLAM 0.25 MG PO TABS
0.2500 mg | ORAL_TABLET | Freq: Two times a day (BID) | ORAL | 0 refills | Status: DC | PRN
  Filled 2023-01-07: qty 30, 15d supply, fill #0

## 2023-02-05 ENCOUNTER — Other Ambulatory Visit (HOSPITAL_COMMUNITY): Payer: Self-pay

## 2023-02-06 ENCOUNTER — Encounter: Payer: Self-pay | Admitting: Internal Medicine

## 2023-02-06 ENCOUNTER — Other Ambulatory Visit: Payer: Self-pay

## 2023-02-06 ENCOUNTER — Other Ambulatory Visit (HOSPITAL_COMMUNITY): Payer: Self-pay

## 2023-02-06 ENCOUNTER — Ambulatory Visit: Payer: 59 | Attending: Internal Medicine | Admitting: Internal Medicine

## 2023-02-06 VITALS — BP 118/74 | HR 65 | Ht 62.0 in | Wt 192.2 lb

## 2023-02-06 DIAGNOSIS — I10 Essential (primary) hypertension: Secondary | ICD-10-CM

## 2023-02-06 DIAGNOSIS — I493 Ventricular premature depolarization: Secondary | ICD-10-CM | POA: Diagnosis not present

## 2023-02-06 DIAGNOSIS — I456 Pre-excitation syndrome: Secondary | ICD-10-CM

## 2023-02-06 MED ORDER — JARDIANCE 25 MG PO TABS
25.0000 mg | ORAL_TABLET | Freq: Every morning | ORAL | 3 refills | Status: DC
  Filled 2023-03-13 – 2023-04-07 (×3): qty 90, 90d supply, fill #0
  Filled 2023-05-19 – 2023-07-10 (×2): qty 90, 90d supply, fill #1

## 2023-02-06 NOTE — Patient Instructions (Signed)

## 2023-02-06 NOTE — Progress Notes (Signed)
HPI Carolyn Phillips returns today for followup. She is a pleasant 47 yo woman with a remote h/o WPW s/p ablation. She developed palpitations and was seen by me and started monitoring her heart with her apple watch. She has had documented PVC's. No chest pain or sob. No syncope. She has started flecainide and her symptoms are improved.  She had been losing weight but had gained 5 lbs. Since her last visit. She has been under more stress caring for her mother who has dementia. Allergies  Allergen Reactions   Penicillins Hives and Swelling    Has patient had a PCN reaction causing immediate rash, facial/tongue/throat swelling, SOB or lightheadedness with hypotension: Yes Has patient had a PCN reaction causing severe rash involving mucus membranes or skin necrosis: No Has patient had a PCN reaction that required hospitalization No Has patient had a PCN reaction occurring within the last 10 years: No If all of the above answers are "NO", then may proceed with Cephalosporin use.   Eye swelling   Metoprolol     "LOL" Drugs cause hair to fall out   Nitrofurantoin Macrocrystal Other (See Comments)    Pt unsure of reaction    Other Nausea And Vomiting    General Anesthesia      Current Outpatient Medications  Medication Sig Dispense Refill   acetaminophen (TYLENOL) 500 MG tablet Take 1,000 mg by mouth every 6 (six) hours as needed for mild pain.     ALPRAZolam (XANAX) 0.25 MG tablet Take 1 tablet (0.25 mg total) by mouth 2 (two) times daily as needed for anxiety. 60 tablet 3   ALPRAZolam (XANAX) 0.25 MG tablet Take 1 tablet (0.25 mg total) by mouth 2 (two) times daily as needed for anxiety 60 tablet 1   ALPRAZolam (XANAX) 0.25 MG tablet Take 1 tablet (0.25 mg total) by mouth 2 (two) times daily as needed for anxiety. 30 tablet 0   ALPRAZolam (XANAX) 0.25 MG tablet Take 1 tablet (0.25 mg total) by mouth 2 (two) times daily as needed for anxiety 30 tablet 0   Biotin 2500 MCG CAPS Take 2,500  mcg by mouth daily.      Blood Glucose Monitoring Suppl (FREESTYLE LITE) DEVI Use to check blood sugar 1 time per day. Dx Code: E11.9 1 each 2   Cholecalciferol (VITAMIN D3) 125 MCG (5000 UT) CAPS Take 5,000 Units by mouth daily.      ciprofloxacin (CIPRO) 500 MG tablet Take 1 tablet (500 mg total) by mouth 2 (two) times daily for 7 days 14 tablet 0   clindamycin (CLEOCIN) 150 MG capsule Take 1 capsule (150 mg total) by mouth 4 (four) times daily. (Patient not taking: Reported on 10/23/2021) 40 capsule 0   clindamycin (CLEOCIN) 300 MG capsule Take 1 capsule (300 mg total) by mouth 2 (two) times daily with food for 7 days 14 capsule 0   Continuous Blood Gluc Sensor (DEXCOM G6 SENSOR) MISC Change every 10 days 9 each 3   Continuous Blood Gluc Sensor (FREESTYLE LIBRE 3 SENSOR) MISC Change sensor topically every 14 (fourteen) days to monitor blood glucose continuously 2 each 5   Continuous Glucose Sensor (FREESTYLE LIBRE 3 SENSOR) MISC Change sensor every 14 days to monitor blood glucose continuously 2 each 5   COVID-19 At Home Antigen Test (CARESTART COVID-19 HOME TEST) KIT Use as directed 4 each 0   cyclobenzaprine (FLEXERIL) 5 MG tablet Take 1 tablet (5 mg total) by mouth 3 (three) times daily  as needed for muscle spasms. (Patient not taking: Reported on 01/01/2022) 60 tablet 0   doxycycline (VIBRAMYCIN) 100 MG capsule Take 1 capsule (100 mg total) by mouth 2 (two) times daily with food and water (Patient taking differently: Take 100 mg by mouth 2 (two) times daily as needed (acne).) 60 capsule 0   Eflornithine HCl (VANIQA) 13.9 % cream Apply 1 application topically 2 (two) times daily as needed (acne).     empagliflozin (JARDIANCE) 25 MG TABS tablet Take 25 mg by mouth daily. 90 tablet 3   empagliflozin (JARDIANCE) 25 MG TABS tablet Take 1 tablet (25 mg total) by mouth in the morning. 90 tablet 2   empagliflozin (JARDIANCE) 25 MG TABS tablet Take 1 tablet (25 mg total) by mouth in the morning. 90 tablet  3   famotidine (PEPCID) 20 MG tablet Take 20 mg by mouth 2 (two) times daily as needed for heartburn or indigestion.     famotidine (PEPCID) 20 MG tablet Take 1 tablet (20 mg total) by mouth 2 (two) times daily as needed for reflux 60 tablet 1   flecainide (TAMBOCOR) 50 MG tablet Take 1 tablet (50 mg total) by mouth 2 (two) times daily. 60 tablet 3   fluticasone (FLONASE) 50 MCG/ACT nasal spray Place 2 sprays into both nostrils daily. (Patient taking differently: Place 2 sprays into both nostrils daily as needed for allergies.) 16 g 0   glucose blood (FREESTYLE LITE) test strip Use to check blood sugar 1 time per day. Dx Code: E11.9 100 each 2   hydrochlorothiazide (HYDRODIURIL) 25 MG tablet Take 1 tablet (25 mg total) by mouth daily for blood pressure /swelling (Patient not taking: Reported on 01/01/2022) 90 tablet 3   hydrochlorothiazide (HYDRODIURIL) 25 MG tablet TAKE 1 TABLET BY MOUTH ONCE A DAY FOR BLOOD PRESSURE/SWELLING. 90 tablet 3   hydrocortisone (ANUSOL-HC) 25 MG suppository Place 1 suppository (25 mg total) rectally at bedtime for 14 days. 14 suppository 1   Hyoscyamine Sulfate SL 0.125 MG SUBL Place 1 tablet ( 0.125 mg) under the tongue and allow to dissolve 2 (two) times daily as needed. 60 tablet 1   ibuprofen (ADVIL) 200 MG tablet Take 200 mg by mouth every 8 (eight) hours as needed for moderate pain.     ibuprofen (ADVIL) 800 MG tablet Take 1 tablet (800 mg total) by mouth 4 (four) times daily as needed for pain (Patient not taking: Reported on 01/01/2022) 16 tablet 0   ibuprofen (ADVIL,MOTRIN) 800 MG tablet Take 1 tablet (800 mg total) by mouth 3 (three) times daily. (Patient not taking: Reported on 01/01/2022) 21 tablet 0   Insulin Glargine-Lixisenatide (SOLIQUA) 100-33 UNT-MCG/ML SOPN Inject 15 Units into the skin daily. 15 mL 5   Insulin Pen Needle 32G X 4 MM MISC Use as directed once daily with Soliqua 100 each 3   Lancets (FREESTYLE) lancets Use to check blood sugar 1 time per day. Dx  Code: E11.9 100 each 2   levocetirizine (XYZAL) 5 MG tablet Take 1 tablet (5 mg total) by mouth at bedtime. 90 tablet 4   meclizine (ANTIVERT) 25 MG tablet TAKE 1 TABLET ( 25MG ) BY MOUTH THREE TIME DAILY AS NEEDED FOR DIZZINESS     methocarbamol (ROBAXIN) 500 MG tablet TAKE 1 TABLET (500MG ) BY MOUTH THREE TIME DAILY AS NEEDED FOR MUSCLE SPASM (Patient not taking: Reported on 01/01/2022)     montelukast (SINGULAIR) 10 MG tablet Take 1 tablet (10 mg total) by mouth daily. 90 tablet 2  Multiple Vitamins-Minerals (ZINC PO) Take 1 tablet by mouth daily.     nebivolol (BYSTOLIC) 10 MG tablet Take 1 tablet (10 mg total) by mouth at bedtime. 90 tablet 2   ondansetron (ZOFRAN-ODT) 4 MG disintegrating tablet Take 1 tablet (4 mg total) by mouth every 8 (eight) hours as needed for nausea for up to 7 days (Patient not taking: Reported on 01/01/2022) 20 tablet 0   oseltamivir (TAMIFLU) 75 MG capsule Take 1 capsule (75 mg total) by mouth 2 (two) times daily for 5 days 10 capsule 0   oxyCODONE (OXY IR/ROXICODONE) 5 MG immediate release tablet Take 1 tablet (5 mg total) by mouth every 6 (six) hours as needed for pain 20 tablet 0   pantoprazole (PROTONIX) 40 MG tablet Take 1 tablet (40 mg total) by mouth 2 (two) times daily for 30 days, THEN 1 tablet (40 mg total) daily. (Patient not taking: Reported on 10/23/2021) 60 tablet 2   repaglinide (PRANDIN) 0.5 MG tablet Take 1 tablet (0.5 mg total) by mouth 2 (two) times daily before a meal with breakfast and with supper 180 tablet 3   repaglinide (PRANDIN) 0.5 MG tablet Take 1 tablet (0.5 mg total) by mouth 2 (two) times daily 15 to 30 minutes before meals 180 tablet 3   rosuvastatin (CRESTOR) 5 MG tablet Take 1 tablet (5 mg total) by mouth daily. 90 tablet 3   Semaglutide (RYBELSUS) 14 MG TABS Take 1 tablet by mouth daily. 90 tablet 3   Semaglutide (RYBELSUS) 14 MG TABS Take 1 tablet (14 mg total) by mouth daily at least 30 minutes before first food, beverage or other oral  medicine of the day 90 tablet 1   Sodium Sulfate-Mag Sulfate-KCl (SUTAB) [id number] MG TABS Use as directed 24 tablet 0   Spacer/Aero-Holding Chambers (AEROCHAMBER PLUS) inhaler Use as instructed 1 each 2   sulfamethoxazole-trimethoprim (BACTRIM DS) 800-160 MG tablet Take 1 tablet by mouth 2 (two) times daily for 7 days. (Patient not taking: Reported on 10/23/2021) 14 tablet 0   tiZANidine (ZANAFLEX) 2 MG tablet Take 2 mg by mouth every 6 (six) hours as needed for muscle spasms.     No current facility-administered medications for this visit.   Facility-Administered Medications Ordered in Other Visits  Medication Dose Route Frequency Provider Last Rate Last Admin   ciprofloxacin (CIPRO) IVPB 400 mg  400 mg Intravenous Once Manus Rudd, MD         Past Medical History:  Diagnosis Date   Beta thalassemia trait    Complication of anesthesia    Diabetes mellitus without complication (HCC)    Dysrhythmia    WPW-ablation   GERD (gastroesophageal reflux disease)    Headache    Hyperlipemia    Hypertension    Obesity    PONV (postoperative nausea and vomiting)    Wolff-Parkinson-White syndrome     ROS:   All systems reviewed and negative except as noted in the HPI.   Past Surgical History:  Procedure Laterality Date   ABDOMINAL HYSTERECTOMY     BLADDER REPAIR     BREAST REDUCTION SURGERY     CHOLECYSTECTOMY N/A 10/25/2021   Procedure: LAPAROSCOPIC CHOLECYSTECTOMY WITH INTRAOPERATIVE CHOLANGIOGRAM;  Surgeon: Manus Rudd, MD;  Location: [facility];  Service: General;  Laterality: N/A;   CHONDROPLASTY Left 09/13/2015   Procedure: CHONDROPLASTY;  Surgeon: Gean Birchwood, MD;  Location: [facility];  Service: Orthopedics;  Laterality: Left;   KNEE ARTHROSCOPY WITH DRILLING/MICROFRACTURE Left 09/13/2015   Procedure: KNEE  ARTHROSCOPY WITH DRILLING/MICROFRACTURE;  Surgeon: Gean Birchwood, MD;  Location: [facility];  Service: Orthopedics;  Laterality: Left;    REDUCTION MAMMAPLASTY Bilateral 2001   SUPRAVENTRICULAR TACHYCARDIA ABLATION N/A 07/04/2014   Procedure: WPW ABLATION;  Surgeon: Marinus Maw, MD;  Location: [facility] CATH LAB;  Service: Cardiovascular;  Laterality: N/A;   TUBAL LIGATION       Family History  Problem Relation Age of Onset   Valvular heart disease Mother        Aortic valve replacement   Diabetes Mother    Heart attack Maternal Grandmother        32s   Stroke Maternal Grandmother    Diabetes Sister    Breast cancer Sister        Half Sister   Diabetes Brother    Breast cancer Paternal Aunt    Breast cancer Cousin 39     Social History   Socioeconomic History   Marital status: Legally Separated    Spouse name: Not on file   Number of children: 2   Years of education: College   Highest education level: Not on file  Occupational History   Occupation: works at [employer], student  Tobacco Use   Smoking status: Never   Smokeless tobacco: Never   Tobacco comments:    "only smoked for one year al ong time ago "  Vaping Use   Vaping Use: Never used  Substance and Sexual Activity   Alcohol use: Yes    Alcohol/week: 0.0 standard drinks of alcohol    Comment: occasional   Drug use: No   Sexual activity: Not on file  Other Topics Concern   Not on file  Social History Narrative   Pt lives with mother, children and husband.   Caffeine use: small coffee per day   1 mountain dew per day   Social Determinants of Health   Financial Resource Strain: Not on file  Food Insecurity: Not on file  Transportation Needs: Not on file  Physical Activity: Not on file  Stress: Not on file  Social Connections: Not on file  Intimate Partner Violence: Not on file     There were no vitals taken for this visit.  Physical Exam:  Well appearing NAD HEENT: Unremarkable Neck:  No JVD, no thyromegally Lymphatics:  No adenopathy Back:  No CVA tenderness Lungs:  Clear HEART:  Regular rate rhythm, no murmurs, no rubs, no  clicks Abd:  soft, positive bowel sounds, no organomegally, no rebound, no guarding Ext:  2 plus pulses, no edema, no cyanosis, no clubbing Skin:  No rashes no nodules Neuro:  CN II through XII intact, motor grossly intact  EKG - nsr with no PVC's and no pre-excitation   Assess/Plan:   1. PVC's - her symptoms appear to be improved. She will continue her beta blocker and low dose flecainide. 2. HTN - her bp today is good. She thinks it has improved since losing weight. At times it may be a little too good.  3. Obesity - she has been losing weight but gained some back. She is encouraged to lose 4. WPW - she is s/p ablation and has no pre-excitation and no recurrent sustained symptoms.    Carolyn Gowda Gaylia Kassel,MD

## 2023-02-10 ENCOUNTER — Other Ambulatory Visit (HOSPITAL_COMMUNITY): Payer: Self-pay

## 2023-02-13 ENCOUNTER — Other Ambulatory Visit (HOSPITAL_COMMUNITY): Payer: Self-pay

## 2023-02-14 ENCOUNTER — Other Ambulatory Visit (HOSPITAL_COMMUNITY): Payer: Self-pay

## 2023-02-14 MED ORDER — ALPRAZOLAM 0.25 MG PO TABS
0.2500 mg | ORAL_TABLET | Freq: Two times a day (BID) | ORAL | 0 refills | Status: DC | PRN
  Filled 2023-02-14: qty 30, 15d supply, fill #0

## 2023-03-13 ENCOUNTER — Other Ambulatory Visit (HOSPITAL_COMMUNITY): Payer: Self-pay

## 2023-03-13 ENCOUNTER — Other Ambulatory Visit: Payer: Self-pay

## 2023-03-14 ENCOUNTER — Other Ambulatory Visit: Payer: Self-pay

## 2023-03-14 ENCOUNTER — Other Ambulatory Visit (HOSPITAL_COMMUNITY): Payer: Self-pay

## 2023-03-14 MED ORDER — ALPRAZOLAM 0.25 MG PO TABS
0.2500 mg | ORAL_TABLET | Freq: Two times a day (BID) | ORAL | 0 refills | Status: DC | PRN
  Filled 2023-03-14: qty 30, 15d supply, fill #0

## 2023-03-26 DIAGNOSIS — E669 Obesity, unspecified: Secondary | ICD-10-CM | POA: Diagnosis not present

## 2023-03-26 DIAGNOSIS — I2510 Atherosclerotic heart disease of native coronary artery without angina pectoris: Secondary | ICD-10-CM | POA: Diagnosis not present

## 2023-03-26 DIAGNOSIS — I10 Essential (primary) hypertension: Secondary | ICD-10-CM | POA: Diagnosis not present

## 2023-03-26 DIAGNOSIS — E1169 Type 2 diabetes mellitus with other specified complication: Secondary | ICD-10-CM | POA: Diagnosis not present

## 2023-03-27 ENCOUNTER — Other Ambulatory Visit (HOSPITAL_COMMUNITY): Payer: Self-pay

## 2023-04-07 ENCOUNTER — Other Ambulatory Visit: Payer: Self-pay

## 2023-04-07 ENCOUNTER — Other Ambulatory Visit: Payer: Self-pay | Admitting: Internal Medicine

## 2023-04-07 ENCOUNTER — Other Ambulatory Visit (HOSPITAL_COMMUNITY): Payer: Self-pay

## 2023-04-07 DIAGNOSIS — I456 Pre-excitation syndrome: Secondary | ICD-10-CM

## 2023-04-07 DIAGNOSIS — I493 Ventricular premature depolarization: Secondary | ICD-10-CM

## 2023-04-07 MED ORDER — FLECAINIDE ACETATE 50 MG PO TABS
50.0000 mg | ORAL_TABLET | Freq: Two times a day (BID) | ORAL | 1 refills | Status: DC
Start: 2023-04-07 — End: 2023-12-21
  Filled 2023-04-07: qty 180, 90d supply, fill #0
  Filled 2023-09-03: qty 180, 90d supply, fill #1

## 2023-04-07 MED ORDER — NEBIVOLOL HCL 10 MG PO TABS
10.0000 mg | ORAL_TABLET | Freq: Every day | ORAL | 2 refills | Status: DC
  Filled 2023-04-07: qty 90, 90d supply, fill #0
  Filled 2024-01-02: qty 90, 90d supply, fill #1
  Filled 2024-04-06: qty 90, 90d supply, fill #2

## 2023-04-08 ENCOUNTER — Other Ambulatory Visit (HOSPITAL_COMMUNITY): Payer: Self-pay

## 2023-04-08 MED ORDER — ALPRAZOLAM 0.25 MG PO TABS
0.2500 mg | ORAL_TABLET | Freq: Two times a day (BID) | ORAL | 0 refills | Status: DC
  Filled 2023-04-22: qty 30, 15d supply, fill #0

## 2023-04-11 ENCOUNTER — Other Ambulatory Visit (HOSPITAL_COMMUNITY): Payer: Self-pay

## 2023-04-22 ENCOUNTER — Other Ambulatory Visit (HOSPITAL_COMMUNITY): Payer: Self-pay

## 2023-04-22 DIAGNOSIS — R0981 Nasal congestion: Secondary | ICD-10-CM | POA: Diagnosis not present

## 2023-04-22 DIAGNOSIS — R058 Other specified cough: Secondary | ICD-10-CM | POA: Diagnosis not present

## 2023-04-22 DIAGNOSIS — E1169 Type 2 diabetes mellitus with other specified complication: Secondary | ICD-10-CM | POA: Diagnosis not present

## 2023-04-22 DIAGNOSIS — Z1152 Encounter for screening for COVID-19: Secondary | ICD-10-CM | POA: Diagnosis not present

## 2023-04-22 DIAGNOSIS — R5383 Other fatigue: Secondary | ICD-10-CM | POA: Diagnosis not present

## 2023-04-22 DIAGNOSIS — J302 Other seasonal allergic rhinitis: Secondary | ICD-10-CM | POA: Diagnosis not present

## 2023-04-22 DIAGNOSIS — J209 Acute bronchitis, unspecified: Secondary | ICD-10-CM | POA: Diagnosis not present

## 2023-04-22 MED ORDER — DOXYCYCLINE HYCLATE 100 MG PO TABS
100.0000 mg | ORAL_TABLET | Freq: Two times a day (BID) | ORAL | 0 refills | Status: DC
  Filled 2023-04-22: qty 20, 10d supply, fill #0

## 2023-04-23 ENCOUNTER — Other Ambulatory Visit (HOSPITAL_COMMUNITY): Payer: Self-pay

## 2023-04-24 ENCOUNTER — Other Ambulatory Visit (HOSPITAL_COMMUNITY): Payer: Self-pay

## 2023-04-24 MED ORDER — ALPRAZOLAM 0.25 MG PO TABS
0.2500 mg | ORAL_TABLET | Freq: Every day | ORAL | 0 refills | Status: DC | PRN
  Filled 2023-04-24: qty 30, 30d supply, fill #0
  Filled 2023-05-19: qty 60, 30d supply, fill #0

## 2023-05-14 DIAGNOSIS — I2510 Atherosclerotic heart disease of native coronary artery without angina pectoris: Secondary | ICD-10-CM | POA: Diagnosis not present

## 2023-05-14 DIAGNOSIS — I10 Essential (primary) hypertension: Secondary | ICD-10-CM | POA: Diagnosis not present

## 2023-05-14 DIAGNOSIS — E1169 Type 2 diabetes mellitus with other specified complication: Secondary | ICD-10-CM | POA: Diagnosis not present

## 2023-05-14 DIAGNOSIS — E669 Obesity, unspecified: Secondary | ICD-10-CM | POA: Diagnosis not present

## 2023-05-20 ENCOUNTER — Other Ambulatory Visit: Payer: Self-pay

## 2023-05-20 ENCOUNTER — Other Ambulatory Visit (HOSPITAL_COMMUNITY): Payer: Self-pay

## 2023-05-26 ENCOUNTER — Other Ambulatory Visit: Payer: Self-pay

## 2023-06-05 DIAGNOSIS — I2510 Atherosclerotic heart disease of native coronary artery without angina pectoris: Secondary | ICD-10-CM | POA: Diagnosis not present

## 2023-06-05 DIAGNOSIS — Z794 Long term (current) use of insulin: Secondary | ICD-10-CM | POA: Diagnosis not present

## 2023-06-05 DIAGNOSIS — I10 Essential (primary) hypertension: Secondary | ICD-10-CM | POA: Diagnosis not present

## 2023-06-05 DIAGNOSIS — E1169 Type 2 diabetes mellitus with other specified complication: Secondary | ICD-10-CM | POA: Diagnosis not present

## 2023-06-06 ENCOUNTER — Other Ambulatory Visit (HOSPITAL_COMMUNITY): Payer: Self-pay

## 2023-06-06 ENCOUNTER — Other Ambulatory Visit: Payer: Self-pay

## 2023-06-06 MED ORDER — MOUNJARO 2.5 MG/0.5ML ~~LOC~~ SOAJ
2.5000 mg | SUBCUTANEOUS | 5 refills | Status: DC
Start: 2023-06-05 — End: 2024-01-30
  Filled 2023-06-06 – 2023-06-19 (×3): qty 2, 28d supply, fill #0
  Filled 2023-07-10 – 2023-07-14 (×2): qty 2, 28d supply, fill #1
  Filled 2023-08-09: qty 2, 28d supply, fill #2
  Filled 2023-09-22: qty 2, 28d supply, fill #3
  Filled 2023-10-19: qty 2, 28d supply, fill #4
  Filled 2024-01-02: qty 2, 28d supply, fill #5

## 2023-06-06 MED ORDER — TOUJEO MAX SOLOSTAR 300 UNIT/ML ~~LOC~~ SOPN
36.0000 [IU] | PEN_INJECTOR | Freq: Every day | SUBCUTANEOUS | 5 refills | Status: DC
  Filled 2023-06-06: qty 9, 75d supply, fill #0
  Filled 2023-07-10 – 2023-10-19 (×2): qty 9, 75d supply, fill #1
  Filled 2024-04-06: qty 9, 75d supply, fill #2

## 2023-06-09 ENCOUNTER — Other Ambulatory Visit (HOSPITAL_COMMUNITY): Payer: Self-pay

## 2023-06-18 ENCOUNTER — Other Ambulatory Visit (HOSPITAL_COMMUNITY): Payer: Self-pay

## 2023-06-18 MED ORDER — FAMOTIDINE 20 MG PO TABS
20.0000 mg | ORAL_TABLET | Freq: Two times a day (BID) | ORAL | 3 refills | Status: DC
  Filled 2023-06-18: qty 60, 30d supply, fill #0
  Filled 2023-09-11 – 2023-09-26 (×2): qty 60, 30d supply, fill #1
  Filled 2023-12-05: qty 60, 30d supply, fill #2
  Filled 2024-01-05: qty 60, 30d supply, fill #3

## 2023-06-19 ENCOUNTER — Other Ambulatory Visit (HOSPITAL_COMMUNITY): Payer: Self-pay

## 2023-06-19 ENCOUNTER — Other Ambulatory Visit: Payer: Self-pay

## 2023-06-20 ENCOUNTER — Encounter: Payer: Self-pay | Admitting: Pharmacist

## 2023-06-20 ENCOUNTER — Other Ambulatory Visit (HOSPITAL_COMMUNITY): Payer: Self-pay

## 2023-06-28 ENCOUNTER — Other Ambulatory Visit (HOSPITAL_COMMUNITY): Payer: Self-pay

## 2023-06-30 ENCOUNTER — Other Ambulatory Visit (HOSPITAL_COMMUNITY): Payer: Self-pay

## 2023-06-30 MED ORDER — MONTELUKAST SODIUM 10 MG PO TABS
10.0000 mg | ORAL_TABLET | Freq: Every day | ORAL | 2 refills | Status: DC
  Filled 2023-06-30 – 2023-07-10 (×2): qty 90, 90d supply, fill #0
  Filled 2023-08-27 – 2023-08-29 (×2): qty 90, 90d supply, fill #1
  Filled 2024-01-05: qty 90, 90d supply, fill #2

## 2023-07-10 ENCOUNTER — Other Ambulatory Visit (HOSPITAL_COMMUNITY): Payer: Self-pay

## 2023-07-11 ENCOUNTER — Other Ambulatory Visit (HOSPITAL_COMMUNITY): Payer: Self-pay

## 2023-07-11 ENCOUNTER — Other Ambulatory Visit: Payer: Self-pay

## 2023-07-14 ENCOUNTER — Other Ambulatory Visit (HOSPITAL_COMMUNITY): Payer: Self-pay

## 2023-07-14 ENCOUNTER — Other Ambulatory Visit: Payer: Self-pay

## 2023-07-15 ENCOUNTER — Other Ambulatory Visit (HOSPITAL_COMMUNITY): Payer: Self-pay

## 2023-07-15 MED ORDER — TOUJEO MAX SOLOSTAR 300 UNIT/ML ~~LOC~~ SOPN
36.0000 [IU] | PEN_INJECTOR | Freq: Every day | SUBCUTANEOUS | 3 refills | Status: DC
  Filled 2023-07-15: qty 9, 75d supply, fill #0
  Filled 2023-08-27: qty 9, 38d supply, fill #0
  Filled 2024-02-03: qty 9, 38d supply, fill #1
  Filled 2024-06-11: qty 9, 38d supply, fill #2

## 2023-07-15 MED ORDER — FREESTYLE LIBRE 3 PLUS SENSOR MISC
1.0000 | 3 refills | Status: AC
  Filled 2023-07-15: qty 2, 30d supply, fill #0
  Filled 2023-07-30 – 2023-08-27 (×3): qty 2, 30d supply, fill #1
  Filled 2023-11-09 – 2023-12-09 (×4): qty 2, 30d supply, fill #2
  Filled 2024-01-05: qty 2, 30d supply, fill #3
  Filled 2024-02-03: qty 2, 30d supply, fill #4
  Filled 2024-03-01: qty 2, 30d supply, fill #5
  Filled 2024-03-23 – 2024-03-29 (×2): qty 2, 30d supply, fill #6
  Filled 2024-04-16 – 2024-04-22 (×2): qty 2, 30d supply, fill #7
  Filled 2024-05-17: qty 2, 30d supply, fill #8
  Filled 2024-06-11: qty 2, 30d supply, fill #9
  Filled 2024-07-09: qty 2, 30d supply, fill #10

## 2023-07-30 ENCOUNTER — Other Ambulatory Visit (HOSPITAL_COMMUNITY): Payer: Self-pay

## 2023-07-30 MED ORDER — ALPRAZOLAM 0.25 MG PO TABS
0.2500 mg | ORAL_TABLET | Freq: Two times a day (BID) | ORAL | 3 refills | Status: DC | PRN
Start: 2023-07-30 — End: 2024-01-30
  Filled 2023-07-30: qty 60, 30d supply, fill #0
  Filled 2023-09-22: qty 60, 30d supply, fill #1
  Filled 2023-12-21: qty 60, 30d supply, fill #2

## 2023-08-05 ENCOUNTER — Other Ambulatory Visit (HOSPITAL_COMMUNITY): Payer: Self-pay

## 2023-08-08 ENCOUNTER — Other Ambulatory Visit (HOSPITAL_COMMUNITY): Payer: Self-pay

## 2023-08-11 ENCOUNTER — Other Ambulatory Visit (HOSPITAL_COMMUNITY): Payer: Self-pay

## 2023-08-11 DIAGNOSIS — J029 Acute pharyngitis, unspecified: Secondary | ICD-10-CM | POA: Diagnosis not present

## 2023-08-11 DIAGNOSIS — R509 Fever, unspecified: Secondary | ICD-10-CM | POA: Diagnosis not present

## 2023-08-11 DIAGNOSIS — J0100 Acute maxillary sinusitis, unspecified: Secondary | ICD-10-CM | POA: Diagnosis not present

## 2023-08-11 DIAGNOSIS — Z1152 Encounter for screening for COVID-19: Secondary | ICD-10-CM | POA: Diagnosis not present

## 2023-08-11 DIAGNOSIS — R058 Other specified cough: Secondary | ICD-10-CM | POA: Diagnosis not present

## 2023-08-11 DIAGNOSIS — R5383 Other fatigue: Secondary | ICD-10-CM | POA: Diagnosis not present

## 2023-08-11 MED ORDER — DOXYCYCLINE HYCLATE 100 MG PO TABS
100.0000 mg | ORAL_TABLET | Freq: Two times a day (BID) | ORAL | 0 refills | Status: DC
  Filled 2023-08-11: qty 20, 10d supply, fill #0

## 2023-08-27 ENCOUNTER — Other Ambulatory Visit: Payer: Self-pay

## 2023-08-27 ENCOUNTER — Other Ambulatory Visit (HOSPITAL_COMMUNITY): Payer: Self-pay

## 2023-08-29 ENCOUNTER — Other Ambulatory Visit (HOSPITAL_COMMUNITY): Payer: Self-pay

## 2023-08-29 ENCOUNTER — Other Ambulatory Visit: Payer: Self-pay

## 2023-09-02 DIAGNOSIS — I10 Essential (primary) hypertension: Secondary | ICD-10-CM | POA: Diagnosis not present

## 2023-09-02 DIAGNOSIS — J0391 Acute recurrent tonsillitis, unspecified: Secondary | ICD-10-CM | POA: Diagnosis not present

## 2023-09-02 DIAGNOSIS — I2510 Atherosclerotic heart disease of native coronary artery without angina pectoris: Secondary | ICD-10-CM | POA: Diagnosis not present

## 2023-09-02 DIAGNOSIS — E1169 Type 2 diabetes mellitus with other specified complication: Secondary | ICD-10-CM | POA: Diagnosis not present

## 2023-09-02 DIAGNOSIS — J0100 Acute maxillary sinusitis, unspecified: Secondary | ICD-10-CM | POA: Diagnosis not present

## 2023-09-02 DIAGNOSIS — Z794 Long term (current) use of insulin: Secondary | ICD-10-CM | POA: Diagnosis not present

## 2023-09-08 ENCOUNTER — Other Ambulatory Visit (HOSPITAL_COMMUNITY): Payer: Self-pay

## 2023-09-08 MED ORDER — AZITHROMYCIN 250 MG PO TABS
ORAL_TABLET | ORAL | 0 refills | Status: DC
  Filled 2023-09-08: qty 6, 5d supply, fill #0

## 2023-09-13 DIAGNOSIS — R071 Chest pain on breathing: Secondary | ICD-10-CM | POA: Diagnosis not present

## 2023-09-13 DIAGNOSIS — R6883 Chills (without fever): Secondary | ICD-10-CM | POA: Diagnosis not present

## 2023-09-13 DIAGNOSIS — M791 Myalgia, unspecified site: Secondary | ICD-10-CM | POA: Diagnosis not present

## 2023-09-13 DIAGNOSIS — I10 Essential (primary) hypertension: Secondary | ICD-10-CM | POA: Diagnosis not present

## 2023-09-13 DIAGNOSIS — K760 Fatty (change of) liver, not elsewhere classified: Secondary | ICD-10-CM | POA: Diagnosis not present

## 2023-09-13 DIAGNOSIS — N3001 Acute cystitis with hematuria: Secondary | ICD-10-CM | POA: Diagnosis not present

## 2023-09-13 DIAGNOSIS — R0602 Shortness of breath: Secondary | ICD-10-CM | POA: Diagnosis not present

## 2023-09-13 DIAGNOSIS — R10814 Left lower quadrant abdominal tenderness: Secondary | ICD-10-CM | POA: Diagnosis not present

## 2023-09-13 DIAGNOSIS — Z9049 Acquired absence of other specified parts of digestive tract: Secondary | ICD-10-CM | POA: Diagnosis not present

## 2023-09-13 DIAGNOSIS — R110 Nausea: Secondary | ICD-10-CM | POA: Diagnosis not present

## 2023-09-13 DIAGNOSIS — N3000 Acute cystitis without hematuria: Secondary | ICD-10-CM | POA: Diagnosis not present

## 2023-09-16 DIAGNOSIS — J0191 Acute recurrent sinusitis, unspecified: Secondary | ICD-10-CM | POA: Diagnosis not present

## 2023-09-16 DIAGNOSIS — J0391 Acute recurrent tonsillitis, unspecified: Secondary | ICD-10-CM | POA: Diagnosis not present

## 2023-09-18 DIAGNOSIS — R1030 Lower abdominal pain, unspecified: Secondary | ICD-10-CM | POA: Diagnosis not present

## 2023-09-18 DIAGNOSIS — N390 Urinary tract infection, site not specified: Secondary | ICD-10-CM | POA: Diagnosis not present

## 2023-09-18 DIAGNOSIS — K5900 Constipation, unspecified: Secondary | ICD-10-CM | POA: Diagnosis not present

## 2023-09-18 DIAGNOSIS — R319 Hematuria, unspecified: Secondary | ICD-10-CM | POA: Diagnosis not present

## 2023-09-18 DIAGNOSIS — D72829 Elevated white blood cell count, unspecified: Secondary | ICD-10-CM | POA: Diagnosis not present

## 2023-09-18 DIAGNOSIS — R300 Dysuria: Secondary | ICD-10-CM | POA: Diagnosis not present

## 2023-09-22 ENCOUNTER — Other Ambulatory Visit: Payer: Self-pay

## 2023-09-23 ENCOUNTER — Other Ambulatory Visit (HOSPITAL_COMMUNITY): Payer: Self-pay

## 2023-09-26 ENCOUNTER — Other Ambulatory Visit (HOSPITAL_COMMUNITY): Payer: Self-pay

## 2023-09-26 ENCOUNTER — Other Ambulatory Visit (HOSPITAL_COMMUNITY): Payer: Self-pay | Admitting: Adult Health

## 2023-09-26 ENCOUNTER — Ambulatory Visit (HOSPITAL_COMMUNITY)
Admission: RE | Admit: 2023-09-26 | Discharge: 2023-09-26 | Disposition: A | Discharge status: Home / Self Care | Payer: 59 | Source: Ambulatory Visit | Attending: Adult Health | Admitting: Adult Health

## 2023-09-26 DIAGNOSIS — B999 Unspecified infectious disease: Secondary | ICD-10-CM | POA: Diagnosis not present

## 2023-09-26 DIAGNOSIS — Z7689 Persons encountering health services in other specified circumstances: Secondary | ICD-10-CM | POA: Diagnosis not present

## 2023-09-26 DIAGNOSIS — E119 Type 2 diabetes mellitus without complications: Secondary | ICD-10-CM

## 2023-09-26 DIAGNOSIS — R140 Abdominal distension (gaseous): Secondary | ICD-10-CM

## 2023-09-26 DIAGNOSIS — L089 Local infection of the skin and subcutaneous tissue, unspecified: Secondary | ICD-10-CM | POA: Diagnosis not present

## 2023-09-26 DIAGNOSIS — R109 Unspecified abdominal pain: Secondary | ICD-10-CM

## 2023-09-26 DIAGNOSIS — Z9049 Acquired absence of other specified parts of digestive tract: Secondary | ICD-10-CM | POA: Diagnosis not present

## 2023-09-26 DIAGNOSIS — M7918 Myalgia, other site: Secondary | ICD-10-CM | POA: Diagnosis not present

## 2023-09-26 DIAGNOSIS — D72825 Bandemia: Secondary | ICD-10-CM | POA: Diagnosis not present

## 2023-09-26 DIAGNOSIS — R509 Fever, unspecified: Secondary | ICD-10-CM | POA: Diagnosis not present

## 2023-09-26 DIAGNOSIS — J329 Chronic sinusitis, unspecified: Secondary | ICD-10-CM | POA: Diagnosis not present

## 2023-09-26 DIAGNOSIS — Z90710 Acquired absence of both cervix and uterus: Secondary | ICD-10-CM | POA: Diagnosis not present

## 2023-09-26 DIAGNOSIS — Z1152 Encounter for screening for COVID-19: Secondary | ICD-10-CM | POA: Diagnosis not present

## 2023-09-26 MED ORDER — IOHEXOL 300 MG/ML  SOLN
100.0000 mL | Freq: Once | INTRAMUSCULAR | Status: AC | PRN
  Administered 2023-09-26: 100 mL via INTRAVENOUS

## 2023-09-29 DIAGNOSIS — I10 Essential (primary) hypertension: Secondary | ICD-10-CM | POA: Diagnosis not present

## 2023-10-02 ENCOUNTER — Encounter: Payer: Self-pay | Admitting: Urology

## 2023-10-02 ENCOUNTER — Other Ambulatory Visit (HOSPITAL_COMMUNITY): Payer: Self-pay

## 2023-10-02 ENCOUNTER — Ambulatory Visit: Payer: 59 | Admitting: Urology

## 2023-10-02 VITALS — BP 117/73 | HR 68 | Ht 63.0 in | Wt 189.0 lb

## 2023-10-02 DIAGNOSIS — N3281 Overactive bladder: Secondary | ICD-10-CM | POA: Diagnosis not present

## 2023-10-02 DIAGNOSIS — Z87440 Personal history of urinary (tract) infections: Secondary | ICD-10-CM

## 2023-10-02 DIAGNOSIS — N390 Urinary tract infection, site not specified: Secondary | ICD-10-CM

## 2023-10-02 DIAGNOSIS — R300 Dysuria: Secondary | ICD-10-CM | POA: Diagnosis not present

## 2023-10-02 LAB — URINALYSIS, ROUTINE W REFLEX MICROSCOPIC
Bilirubin, UA: NEGATIVE
Ketones, UA: NEGATIVE
Leukocytes,UA: NEGATIVE
Nitrite, UA: NEGATIVE
Protein,UA: NEGATIVE
RBC, UA: NEGATIVE
Specific Gravity, UA: 1.01 (ref 1.005–1.030)
Urobilinogen, Ur: 0.2 mg/dL (ref 0.2–1.0)
pH, UA: 5.5 (ref 5.0–7.5)

## 2023-10-02 MED ORDER — SULFAMETHOXAZOLE-TRIMETHOPRIM 400-80 MG PO TABS
1.0000 | ORAL_TABLET | Freq: Every evening | ORAL | 1 refills | Status: DC
  Filled 2023-10-02: qty 60, 60d supply, fill #0

## 2023-10-02 MED ORDER — SOLIFENACIN SUCCINATE 10 MG PO TABS
10.0000 mg | ORAL_TABLET | Freq: Every day | ORAL | 5 refills | Status: DC
  Filled 2023-10-02: qty 30, 30d supply, fill #0

## 2023-10-02 NOTE — Progress Notes (Addendum)
 Assessment: 1. Recurrent UTI   2. OAB (overactive bladder)    S/p prior hysterectomy and bladder repair On jardiance   Plan: Today had a long discussion with the patient regarding her urologic issues. I have recommend that she discuss with her primary care an alternative to Jardiance  which could certainly be contributing to her urinary frequency urgency and recurrent UTIs.  Patient elects new medical therapy for OAB Rx: Solifenacin  10 mg (patient will begin by taking 1/2 tablet daily) Will begin low-dose Bactrim  suppression  Patient will follow-up in 6 to 8 weeks for recheck with female exam and cystoscopy  Chief Complaint: recurrent uti  History of Present Illness:  Carolyn Phillips is a 48 y.o. female who is seen in consultation from Larnell Hamilton, MD for evaluation of RECURRENT UTI. Past medical history is remarkable for WPW ablation 2015, hypertension, hyperlipidemia, diabetes. Prior GU history of remarkable for abdominal hysterectomy and bladder repair in 2010.  She is going to obtain her operative note. Patient has had multiple intermittent episodes compatible with simple cystitis and has been treated with multiple courses of antibiotics over the last 6 months.  These are without culture documentation.  At baseline she also has a lot of frequency urgency and occasional urge incontinence that has been progressing over the last few years. Presented 09/13/2023 with flank discomfort and significant dysuria and frequency.  Was noted to have significant microscopic hematuria and pyuria.  Urine culture grew mixed flora.  Patient was treated empirically with Omnicef  300 twice daily for 7 days. She has normal renal function.  She denies any recent fevers.  UA today is entirely clear  CT A/P 09/26/2023--no acute findings. Adrenals/Urinary Tract: Adrenal glands are within normal limits. Kidneys demonstrate a normal enhancement pattern bilaterally. No renal calculi or  obstructive changes are seen. Normal excretion is noted on delayed images. Bladder is decompressed.   Reproductive: Status post hysterectomy. No adnexal masses.   Past Medical History:  Past Medical History:  Diagnosis Date   Beta thalassemia trait    Complication of anesthesia    Diabetes mellitus without complication (HCC)    Dysrhythmia    WPW-ablation   GERD (gastroesophageal reflux disease)    Headache    Hyperlipemia    Hypertension    Obesity    PONV (postoperative nausea and vomiting)    Wolff-Parkinson-White syndrome     Past Surgical History:  Past Surgical History:  Procedure Laterality Date   ABDOMINAL HYSTERECTOMY     BLADDER REPAIR     BREAST REDUCTION SURGERY     CHOLECYSTECTOMY N/A 10/25/2021   Procedure: LAPAROSCOPIC CHOLECYSTECTOMY WITH INTRAOPERATIVE CHOLANGIOGRAM;  Surgeon: Belinda Cough, MD;  Location: [facility] OR;  Service: General;  Laterality: N/A;   CHONDROPLASTY Left 09/13/2015   Procedure: CHONDROPLASTY;  Surgeon: Dempsey Sensor, MD;  Location: [facility];  Service: Orthopedics;  Laterality: Left;   KNEE ARTHROSCOPY WITH DRILLING/MICROFRACTURE Left 09/13/2015   Procedure: KNEE ARTHROSCOPY WITH DRILLING/MICROFRACTURE;  Surgeon: Dempsey Sensor, MD;  Location: [facility];  Service: Orthopedics;  Laterality: Left;   REDUCTION MAMMAPLASTY Bilateral 2001   SUPRAVENTRICULAR TACHYCARDIA ABLATION N/A 07/04/2014   Procedure: WPW ABLATION;  Surgeon: Danelle LELON Birmingham, MD;  Location[facility] CATH LAB;  Service: Cardiovascular;  Laterality: N/A;   TUBAL LIGATION      Allergies:  Allergies  Allergen Reactions   Penicillins Hives and Swelling    Has patient had a PCN reaction causing immediate rash, facial/tongue/throat swelling, SOB or lightheadedness with hypotension: Yes  Has patient had a PCN reaction causing severe rash involving mucus membranes or skin necrosis: No Has patient had a PCN reaction that required hospitalization No Has patient had  a PCN reaction occurring within the last 10 years: No If all of the above answers are NO, then may proceed with Cephalosporin use.   Eye swelling   Metoprolol     LOL Drugs cause hair to fall out   Nitrofurantoin Macrocrystal Other (See Comments)    Pt unsure of reaction    Other Nausea And Vomiting    General Anesthesia     Family History:  Family History  Problem Relation Age of Onset   Valvular heart disease Mother        Aortic valve replacement   Diabetes Mother    Heart attack Maternal Grandmother        19s   Stroke Maternal Grandmother    Diabetes Sister    Breast cancer Sister        Half Sister   Diabetes Brother    Breast cancer Paternal Aunt    Breast cancer Cousin 58    Social History:  Social History   Tobacco Use   Smoking status: Never   Smokeless tobacco: Never   Tobacco comments:    only smoked for one year al ong time ago   Vaping Use   Vaping status: Never Used  Substance Use Topics   Alcohol  use: Yes    Alcohol /week: 0.0 standard drinks of alcohol     Comment: occasional   Drug use: No    Review of symptoms:  Constitutional:  Negative for unexplained weight loss, night sweats, fever, chills ENT:  Negative for nose bleeds, sinus pain, painful swallowing CV:  Negative for chest pain, shortness of breath, exercise intolerance, palpitations, loss of consciousness Resp:  Negative for cough, wheezing, shortness of breath GI:  Negative for nausea, vomiting, diarrhea, bloody stools GU:  Positives noted in HPI; otherwise negative for gross hematuria, dysuria, urinary incontinence Neuro:  Negative for seizures, poor balance, limb weakness, slurred speech Psych:  Negative for lack of energy, depression, anxiety Endocrine:  Negative for polydipsia, polyuria, symptoms of hypoglycemia (dizziness, hunger, sweating) Hematologic:  Negative for anemia, purpura, petechia, prolonged or excessive bleeding, use of anticoagulants  Allergic:  Negative for  difficulty breathing or choking as a result of exposure to anything; no shellfish allergy; no allergic response (rash/itch) to materials, foods  Physical exam: BP 117/73   Pulse 68   Ht 5' 3 (1.6 m)   Wt 189 lb (85.7 kg)   BMI 33.48 kg/m  GENERAL APPEARANCE:  Well appearing, well developed, well nourished, NAD   Results: Ua clear

## 2023-10-08 ENCOUNTER — Other Ambulatory Visit (HOSPITAL_COMMUNITY): Payer: Self-pay

## 2023-10-08 DIAGNOSIS — R768 Other specified abnormal immunological findings in serum: Secondary | ICD-10-CM | POA: Diagnosis not present

## 2023-10-08 DIAGNOSIS — B999 Unspecified infectious disease: Secondary | ICD-10-CM | POA: Diagnosis not present

## 2023-10-08 DIAGNOSIS — M7918 Myalgia, other site: Secondary | ICD-10-CM | POA: Diagnosis not present

## 2023-10-08 DIAGNOSIS — J351 Hypertrophy of tonsils: Secondary | ICD-10-CM | POA: Diagnosis not present

## 2023-10-08 DIAGNOSIS — J329 Chronic sinusitis, unspecified: Secondary | ICD-10-CM | POA: Diagnosis not present

## 2023-10-08 DIAGNOSIS — R051 Acute cough: Secondary | ICD-10-CM | POA: Diagnosis not present

## 2023-10-08 DIAGNOSIS — R7982 Elevated C-reactive protein (CRP): Secondary | ICD-10-CM | POA: Diagnosis not present

## 2023-10-08 DIAGNOSIS — D72825 Bandemia: Secondary | ICD-10-CM | POA: Diagnosis not present

## 2023-10-08 DIAGNOSIS — R109 Unspecified abdominal pain: Secondary | ICD-10-CM | POA: Diagnosis not present

## 2023-10-08 MED ORDER — PREDNISONE 10 MG (21) PO TBPK
ORAL_TABLET | ORAL | 0 refills | Status: DC
  Filled 2023-10-08: qty 21, 6d supply, fill #0

## 2023-10-19 ENCOUNTER — Other Ambulatory Visit (HOSPITAL_COMMUNITY): Payer: Self-pay

## 2023-10-19 ENCOUNTER — Other Ambulatory Visit: Payer: Self-pay

## 2023-10-20 ENCOUNTER — Encounter (HOSPITAL_COMMUNITY): Payer: Self-pay

## 2023-10-20 ENCOUNTER — Other Ambulatory Visit (HOSPITAL_COMMUNITY): Payer: Self-pay

## 2023-10-20 MED ORDER — LEVOCETIRIZINE DIHYDROCHLORIDE 5 MG PO TABS
5.0000 mg | ORAL_TABLET | Freq: Every day | ORAL | 4 refills | Status: DC
  Filled 2023-10-20: qty 90, 90d supply, fill #0
  Filled 2024-04-06: qty 90, 90d supply, fill #1
  Filled 2024-07-09: qty 90, 90d supply, fill #2
  Filled 2024-09-03: qty 90, 90d supply, fill #3

## 2023-10-20 MED ORDER — ALPRAZOLAM 0.25 MG PO TABS
0.2500 mg | ORAL_TABLET | Freq: Two times a day (BID) | ORAL | 0 refills | Status: DC | PRN
  Filled 2023-10-20 – 2023-11-09 (×2): qty 60, 30d supply, fill #0

## 2023-10-24 ENCOUNTER — Other Ambulatory Visit (HOSPITAL_COMMUNITY): Payer: Self-pay

## 2023-11-06 DIAGNOSIS — Z794 Long term (current) use of insulin: Secondary | ICD-10-CM | POA: Diagnosis not present

## 2023-11-06 DIAGNOSIS — I10 Essential (primary) hypertension: Secondary | ICD-10-CM | POA: Diagnosis not present

## 2023-11-06 DIAGNOSIS — E1169 Type 2 diabetes mellitus with other specified complication: Secondary | ICD-10-CM | POA: Diagnosis not present

## 2023-11-06 DIAGNOSIS — I2510 Atherosclerotic heart disease of native coronary artery without angina pectoris: Secondary | ICD-10-CM | POA: Diagnosis not present

## 2023-11-06 DIAGNOSIS — E669 Obesity, unspecified: Secondary | ICD-10-CM | POA: Diagnosis not present

## 2023-11-07 DIAGNOSIS — M79642 Pain in left hand: Secondary | ICD-10-CM | POA: Diagnosis not present

## 2023-11-07 DIAGNOSIS — M79641 Pain in right hand: Secondary | ICD-10-CM | POA: Diagnosis not present

## 2023-11-07 DIAGNOSIS — E669 Obesity, unspecified: Secondary | ICD-10-CM | POA: Diagnosis not present

## 2023-11-07 DIAGNOSIS — Z6834 Body mass index (BMI) 34.0-34.9, adult: Secondary | ICD-10-CM | POA: Diagnosis not present

## 2023-11-07 DIAGNOSIS — M79672 Pain in left foot: Secondary | ICD-10-CM | POA: Diagnosis not present

## 2023-11-07 DIAGNOSIS — M79671 Pain in right foot: Secondary | ICD-10-CM | POA: Diagnosis not present

## 2023-11-09 ENCOUNTER — Other Ambulatory Visit: Payer: Self-pay

## 2023-11-10 ENCOUNTER — Other Ambulatory Visit (HOSPITAL_COMMUNITY): Payer: Self-pay

## 2023-11-12 ENCOUNTER — Other Ambulatory Visit (HOSPITAL_COMMUNITY): Payer: Self-pay

## 2023-11-13 ENCOUNTER — Other Ambulatory Visit: Payer: 59 | Admitting: Urology

## 2023-11-14 ENCOUNTER — Ambulatory Visit: Payer: 59 | Admitting: Urology

## 2023-11-14 ENCOUNTER — Other Ambulatory Visit (HOSPITAL_COMMUNITY): Payer: Self-pay

## 2023-11-14 ENCOUNTER — Encounter: Payer: Self-pay | Admitting: Urology

## 2023-11-14 VITALS — BP 132/86 | HR 75

## 2023-11-14 DIAGNOSIS — R8271 Bacteriuria: Secondary | ICD-10-CM

## 2023-11-14 DIAGNOSIS — N390 Urinary tract infection, site not specified: Secondary | ICD-10-CM | POA: Diagnosis not present

## 2023-11-14 DIAGNOSIS — Z87440 Personal history of urinary (tract) infections: Secondary | ICD-10-CM

## 2023-11-14 DIAGNOSIS — R8281 Pyuria: Secondary | ICD-10-CM

## 2023-11-14 DIAGNOSIS — R82998 Other abnormal findings in urine: Secondary | ICD-10-CM

## 2023-11-14 DIAGNOSIS — N3281 Overactive bladder: Secondary | ICD-10-CM

## 2023-11-14 LAB — URINALYSIS, ROUTINE W REFLEX MICROSCOPIC
Bilirubin, UA: NEGATIVE
Glucose, UA: NEGATIVE
Ketones, UA: NEGATIVE
Nitrite, UA: POSITIVE — AB
Protein,UA: NEGATIVE
Specific Gravity, UA: 1.02 (ref 1.005–1.030)
Urobilinogen, Ur: 0.2 mg/dL (ref 0.2–1.0)
pH, UA: 6 (ref 5.0–7.5)

## 2023-11-14 LAB — MICROSCOPIC EXAMINATION
RBC, Urine: NONE SEEN /[HPF] (ref 0–2)
WBC, UA: >30 /[HPF] — ABNORMAL HIGH (ref 0–5)

## 2023-11-14 MED ORDER — CEFDINIR 300 MG PO CAPS
300.0000 mg | ORAL_CAPSULE | Freq: Two times a day (BID) | ORAL | 0 refills | Status: DC
  Filled 2023-11-14: qty 10, 5d supply, fill #0

## 2023-11-14 NOTE — Addendum Note (Signed)
Addended by: Carolin Coy on: 11/14/2023 10:12 AM   Modules accepted: Orders

## 2023-11-14 NOTE — Progress Notes (Signed)
Assessment: 1. Recurrent UTI   2. OAB (overactive bladder)     Plan: Urine for culture Treat empirically with Omnicef twice daily for 5 days Reschedule cystoscopy  Chief Complaint: Recurrent UTI  HPI: Carolyn Phillips is a 48 y.o. female who presents for continued evaluation of recurrent UTI.  Please see my note 10-2023 at the time of initial visit for detailed history.  Patient presents today for planned cystoscopy.  However, urinalysis today reveals obvious UTI.  Urine is nitrate positive with marked pyuria and bacteriuria.  Will send for culture and begin treatment empirically with Omnicef.  Reschedule cystoscopy.  Continue low-dose Bactrim following Omnicef.   Portions of the above documentation were copied from a prior visit for review purposes only.  Allergies: Allergies  Allergen Reactions   Penicillins Hives and Swelling    Has patient had a PCN reaction causing immediate rash, facial/tongue/throat swelling, SOB or lightheadedness with hypotension: Yes Has patient had a PCN reaction causing severe rash involving mucus membranes or skin necrosis: No Has patient had a PCN reaction that required hospitalization No Has patient had a PCN reaction occurring within the last 10 years: No If all of the above answers are "NO", then may proceed with Cephalosporin use.   Eye swelling   Metoprolol     "LOL" Drugs cause hair to fall out   Nitrofurantoin Macrocrystal Other (See Comments)    Pt unsure of reaction    Other Nausea And Vomiting    General Anesthesia     PMH: Past Medical History:  Diagnosis Date   Beta thalassemia trait    Complication of anesthesia    Diabetes mellitus without complication (HCC)    Dysrhythmia    WPW-ablation   GERD (gastroesophageal reflux disease)    Headache    Hyperlipemia    Hypertension    Obesity    PONV (postoperative nausea and vomiting)    Wolff-Parkinson-White syndrome     PSH: Past Surgical History:  Procedure  Laterality Date   ABDOMINAL HYSTERECTOMY     BLADDER REPAIR     BREAST REDUCTION SURGERY     CHOLECYSTECTOMY N/A 10/25/2021   Procedure: LAPAROSCOPIC CHOLECYSTECTOMY WITH INTRAOPERATIVE CHOLANGIOGRAM;  Surgeon: Manus Rudd, MD;  Location: [facility] OR;  Service: General;  Laterality: N/A;   CHONDROPLASTY Left 09/13/2015   Procedure: CHONDROPLASTY;  Surgeon: Gean Birchwood, MD;  Location: [facility];  Service: Orthopedics;  Laterality: Left;   KNEE ARTHROSCOPY WITH DRILLING/MICROFRACTURE Left 09/13/2015   Procedure: KNEE ARTHROSCOPY WITH DRILLING/MICROFRACTURE;  Surgeon: Gean Birchwood, MD;  Location: [facility];  Service: Orthopedics;  Laterality: Left;   REDUCTION MAMMAPLASTY Bilateral 2001   SUPRAVENTRICULAR TACHYCARDIA ABLATION N/A 07/04/2014   Procedure: WPW ABLATION;  Surgeon: Marinus Maw, MD;  Location: [facility] CATH LAB;  Service: Cardiovascular;  Laterality: N/A;   TUBAL LIGATION      SH: Social History   Tobacco Use   Smoking status: Never   Smokeless tobacco: Never   Tobacco comments:    "only smoked for one year al ong time ago "  Vaping Use   Vaping status: Never Used  Substance Use Topics   Alcohol use: Yes    Alcohol/week: 0.0 standard drinks of alcohol    Comment: occasional   Drug use: No    ROS: Constitutional:  Negative for fever, chills, weight loss CV: Negative for chest pain, previous MI, hypertension Respiratory:  Negative for shortness of breath, wheezing, sleep apnea, frequent cough GI:  Negative for  nausea, vomiting, bloody stool, GERD  PE:   GENERAL APPEARANCE:  Well appearing, well developed, well nourished, NAD    Results: Urinalysis is nitrate positive with significant pyuria and bacteriuria

## 2023-11-17 LAB — URINE CULTURE

## 2023-11-18 ENCOUNTER — Ambulatory Visit (INDEPENDENT_AMBULATORY_CARE_PROVIDER_SITE_OTHER): Payer: 59 | Admitting: Urology

## 2023-11-18 ENCOUNTER — Encounter: Payer: Self-pay | Admitting: Urology

## 2023-11-18 ENCOUNTER — Other Ambulatory Visit (HOSPITAL_COMMUNITY): Payer: Self-pay

## 2023-11-18 VITALS — BP 136/85 | HR 72

## 2023-11-18 DIAGNOSIS — Z87442 Personal history of urinary calculi: Secondary | ICD-10-CM

## 2023-11-18 DIAGNOSIS — N390 Urinary tract infection, site not specified: Secondary | ICD-10-CM

## 2023-11-18 DIAGNOSIS — Z09 Encounter for follow-up examination after completed treatment for conditions other than malignant neoplasm: Secondary | ICD-10-CM

## 2023-11-18 MED ORDER — ESTRADIOL 0.1 MG/GM VA CREA
TOPICAL_CREAM | VAGINAL | 12 refills | Status: AC
  Filled 2023-11-18: qty 42.5, 90d supply, fill #0
  Filled 2024-04-06: qty 42.5, 90d supply, fill #1

## 2023-11-18 MED ORDER — CEPHALEXIN 250 MG PO CAPS
250.0000 mg | ORAL_CAPSULE | Freq: Every evening | ORAL | 30 refills | Status: AC
  Filled 2023-11-18: qty 30, 30d supply, fill #0
  Filled 2023-12-21: qty 30, 30d supply, fill #1
  Filled 2024-01-30: qty 30, 30d supply, fill #2
  Filled 2024-03-03: qty 30, 30d supply, fill #3
  Filled 2024-03-23 – 2024-03-29 (×2): qty 30, 30d supply, fill #4
  Filled 2024-05-17: qty 30, 30d supply, fill #5
  Filled 2024-06-11: qty 30, 30d supply, fill #6
  Filled 2024-07-09: qty 30, 30d supply, fill #7
  Filled 2024-08-13 (×2): qty 30, 30d supply, fill #8
  Filled 2024-09-03 – 2024-09-27 (×2): qty 30, 30d supply, fill #9
  Filled 2024-11-01: qty 30, 30d supply, fill #0

## 2023-11-18 NOTE — Progress Notes (Signed)
 Assessment: 1. Recurrent UTI     Plan: Following the cystoscopy today, I sat down and had a long discussion with the patient regarding her recurrent UTIs.  It should be noted that she is also going through menopause at present with significant hot flashes.  Will DC vesicare.  Could consider b-agonist in future if necessary  UTI prevention measures reviewed in detail today.  Briefly patient was instructed to stay well-hydrated, to not prolongedly hold her urine. I have also recommended-- Cranberry supplementation Urinary probiotic with lactobacillus Vaginal estrogen cream Will also change low-dose suppression to keflex 250qhs Follow-up 3 to 4 months for recheck  Chief Complaint: Chief Complaint  Patient presents with   Recurrent UTI    HPI: Carolyn Phillips is a 48 y.o. female who presents for continued evaluation of recurrent UTI. Please see my note 10/02/2023 at the time of initial visit for detailed history. Last week when the patient presented for planned cystoscopy for urine was infected.  She grew E. coli resistant to Bactrim which she was taken as a low-dose preventative. Note is also made that I received her outside operative report from her prior bladder surgery.  In 2011 she underwent an open Burch procedure for genuine stress urinary incontinence in [place].  Patient was started last month on Vesicare.  She reports that it is helpful in terms of cutting down on frequency and urgency.  However, she has noted that she has to strain to void and has an interrupted stream since taking it.     Portions of the above documentation were copied from a prior visit for review purposes only.  Allergies: Allergies  Allergen Reactions   Penicillins Hives and Swelling    Has patient had a PCN reaction causing immediate rash, facial/tongue/throat swelling, SOB or lightheadedness with hypotension: Yes Has patient had a PCN reaction causing severe rash involving mucus  membranes or skin necrosis: No Has patient had a PCN reaction that required hospitalization No Has patient had a PCN reaction occurring within the last 10 years: No If all of the above answers are "NO", then may proceed with Cephalosporin use.   Eye swelling   Metoprolol     "LOL" Drugs cause hair to fall out   Nitrofurantoin Macrocrystal Other (See Comments)    Pt unsure of reaction    Other Nausea And Vomiting    General Anesthesia     PMH: Past Medical History:  Diagnosis Date   Beta thalassemia trait    Complication of anesthesia    Diabetes mellitus without complication (HCC)    Dysrhythmia    WPW-ablation   GERD (gastroesophageal reflux disease)    Headache    Hyperlipemia    Hypertension    Obesity    PONV (postoperative nausea and vomiting)    Wolff-Parkinson-White syndrome     PSH: Past Surgical History:  Procedure Laterality Date   ABDOMINAL HYSTERECTOMY     BLADDER REPAIR     BREAST REDUCTION SURGERY     CHOLECYSTECTOMY N/A 10/25/2021   Procedure: LAPAROSCOPIC CHOLECYSTECTOMY WITH INTRAOPERATIVE CHOLANGIOGRAM;  Surgeon: Manus Rudd, MD;  Location: [facility] OR;  Service: General;  Laterality: N/A;   CHONDROPLASTY Left 09/13/2015   Procedure: CHONDROPLASTY;  Surgeon: Gean Birchwood, MD;  Location: [facility];  Service: Orthopedics;  Laterality: Left;   KNEE ARTHROSCOPY WITH DRILLING/MICROFRACTURE Left 09/13/2015   Procedure: KNEE ARTHROSCOPY WITH DRILLING/MICROFRACTURE;  Surgeon: Gean Birchwood, MD;  Location:  SURGERY CENTER;  Service: Orthopedics;  Laterality: Left;  REDUCTION MAMMAPLASTY Bilateral 2001   SUPRAVENTRICULAR TACHYCARDIA ABLATION N/A 07/04/2014   Procedure: WPW ABLATION;  Surgeon: Marinus Maw, MD;  Location[facility] LAB;  Service: Cardiovascular;  Laterality: N/A;   TUBAL LIGATION      SH: Social History   Tobacco Use   Smoking status: Never   Smokeless tobacco: Never   Tobacco comments:    "only smoked for one year al  ong time ago "  Vaping Use   Vaping status: Never Used  Substance Use Topics   Alcohol use: Yes    Alcohol/week: 0.0 standard drinks of alcohol    Comment: occasional   Drug use: No    ROS: Constitutional:  Negative for fever, chills, weight loss CV: Negative for chest pain, previous MI, hypertension Respiratory:  Negative for shortness of breath, wheezing, sleep apnea, frequent cough GI:  Negative for nausea, vomiting, bloody stool, GERD  PE: BP 136/85   Pulse 72  GENERAL APPEARANCE:  Well appearing, well developed, well nourished, NAD    Results: UA clear  Procedure: Cystoscopy ureteroscopy  Indication: Recurrent UTI; prior history of bladder suspension (Burch procedure  Description of procedure: The patient was brought to the procedure room where she was correctly identified.  The procedure was again reviewed with her and informed consent was obtained. The patient was positioned in the modified dorsolithotomy position and her external genitalia were prepped in usual sterile fashion.  Preprocedural timeout was performed.  Flexible cystoscopy was performed.  The urethra appeared normal.  The bladder was entered and visualized systematically.  There were no focal mucosal lesions appreciated no evidence of suture or other foreign body.  The ureteral openings appeared normal.  After removing the scope bimanual examination reveals good pelvic support.  No mass or tenderness.

## 2023-11-19 ENCOUNTER — Other Ambulatory Visit: Payer: 59 | Admitting: Urology

## 2023-11-20 DIAGNOSIS — N390 Urinary tract infection, site not specified: Secondary | ICD-10-CM | POA: Diagnosis not present

## 2023-11-20 LAB — URINALYSIS, ROUTINE W REFLEX MICROSCOPIC
Bilirubin, UA: NEGATIVE
Glucose, UA: NEGATIVE
Ketones, UA: NEGATIVE
Leukocytes,UA: NEGATIVE
Nitrite, UA: NEGATIVE
Protein,UA: NEGATIVE
RBC, UA: NEGATIVE
Specific Gravity, UA: 1.02 (ref 1.005–1.030)
Urobilinogen, Ur: 0.2 mg/dL (ref 0.2–1.0)
pH, UA: 6 (ref 5.0–7.5)

## 2023-11-26 DIAGNOSIS — E1169 Type 2 diabetes mellitus with other specified complication: Secondary | ICD-10-CM | POA: Diagnosis not present

## 2023-11-26 DIAGNOSIS — I2510 Atherosclerotic heart disease of native coronary artery without angina pectoris: Secondary | ICD-10-CM | POA: Diagnosis not present

## 2023-11-26 DIAGNOSIS — R7989 Other specified abnormal findings of blood chemistry: Secondary | ICD-10-CM | POA: Diagnosis not present

## 2023-11-26 DIAGNOSIS — I10 Essential (primary) hypertension: Secondary | ICD-10-CM | POA: Diagnosis not present

## 2023-11-26 DIAGNOSIS — Z1389 Encounter for screening for other disorder: Secondary | ICD-10-CM | POA: Diagnosis not present

## 2023-11-26 DIAGNOSIS — M810 Age-related osteoporosis without current pathological fracture: Secondary | ICD-10-CM | POA: Diagnosis not present

## 2023-11-30 ENCOUNTER — Other Ambulatory Visit: Payer: Self-pay

## 2023-11-30 ENCOUNTER — Encounter (HOSPITAL_COMMUNITY): Payer: Self-pay

## 2023-11-30 ENCOUNTER — Emergency Department (HOSPITAL_COMMUNITY)
Admission: EM | Admit: 2023-11-30 | Discharge: 2023-11-30 | Disposition: A | Discharge status: Home / Self Care | Attending: Emergency Medicine | Admitting: Emergency Medicine

## 2023-11-30 ENCOUNTER — Emergency Department (HOSPITAL_COMMUNITY)

## 2023-11-30 DIAGNOSIS — R112 Nausea with vomiting, unspecified: Secondary | ICD-10-CM | POA: Insufficient documentation

## 2023-11-30 DIAGNOSIS — R109 Unspecified abdominal pain: Secondary | ICD-10-CM | POA: Diagnosis not present

## 2023-11-30 DIAGNOSIS — R509 Fever, unspecified: Secondary | ICD-10-CM | POA: Insufficient documentation

## 2023-11-30 DIAGNOSIS — I10 Essential (primary) hypertension: Secondary | ICD-10-CM | POA: Insufficient documentation

## 2023-11-30 DIAGNOSIS — R197 Diarrhea, unspecified: Secondary | ICD-10-CM | POA: Diagnosis not present

## 2023-11-30 DIAGNOSIS — E119 Type 2 diabetes mellitus without complications: Secondary | ICD-10-CM | POA: Diagnosis not present

## 2023-11-30 DIAGNOSIS — E860 Dehydration: Secondary | ICD-10-CM | POA: Diagnosis not present

## 2023-11-30 DIAGNOSIS — Z794 Long term (current) use of insulin: Secondary | ICD-10-CM | POA: Diagnosis not present

## 2023-11-30 LAB — URINALYSIS, ROUTINE W REFLEX MICROSCOPIC
Bilirubin Urine: NEGATIVE
Glucose, UA: 50 mg/dL — AB
Ketones, ur: NEGATIVE mg/dL
Leukocytes,Ua: NEGATIVE
Nitrite: NEGATIVE
Protein, ur: 100 mg/dL — AB
Specific Gravity, Urine: 1.028 (ref 1.005–1.030)
pH: 5 (ref 5.0–8.0)

## 2023-11-30 LAB — CBC
HCT: 44.1 % (ref 36.0–46.0)
Hemoglobin: 13.9 g/dL (ref 12.0–15.0)
MCH: 23.3 pg — ABNORMAL LOW (ref 26.0–34.0)
MCHC: 31.5 g/dL (ref 30.0–36.0)
MCV: 74 fL — ABNORMAL LOW (ref 80.0–100.0)
Platelets: 332 10*3/uL (ref 150–400)
RBC: 5.96 MIL/uL — ABNORMAL HIGH (ref 3.87–5.11)
RDW: 16.1 % — ABNORMAL HIGH (ref 11.5–15.5)
WBC: 8.5 10*3/uL (ref 4.0–10.5)
nRBC: 0 % (ref 0.0–0.2)

## 2023-11-30 LAB — COMPREHENSIVE METABOLIC PANEL
ALT: 21 U/L (ref 0–44)
AST: 22 U/L (ref 15–41)
Albumin: 3.6 g/dL (ref 3.5–5.0)
Alkaline Phosphatase: 62 U/L (ref 38–126)
Anion gap: 12 (ref 5–15)
BUN: 13 mg/dL (ref 6–20)
CO2: 23 mmol/L (ref 22–32)
Calcium: 9.1 mg/dL (ref 8.9–10.3)
Chloride: 102 mmol/L (ref 98–111)
Creatinine, Ser: 0.85 mg/dL (ref 0.44–1.00)
GFR, Estimated: >60 mL/min (ref >60)
Glucose, Bld: 243 mg/dL — ABNORMAL HIGH (ref 70–99)
Potassium: 3.4 mmol/L — ABNORMAL LOW (ref 3.5–5.1)
Sodium: 137 mmol/L (ref 135–145)
Total Bilirubin: 0.9 mg/dL (ref 0.0–1.2)
Total Protein: 7.2 g/dL (ref 6.5–8.1)

## 2023-11-30 LAB — RESP PANEL BY RT-PCR (RSV, FLU A&B, COVID)  RVPGX2
Influenza A by PCR: NEGATIVE
Influenza B by PCR: NEGATIVE
Resp Syncytial Virus by PCR: NEGATIVE
SARS Coronavirus 2 by RT PCR: NEGATIVE

## 2023-11-30 LAB — MAGNESIUM: Magnesium: 1.6 mg/dL — ABNORMAL LOW (ref 1.7–2.4)

## 2023-11-30 LAB — LIPASE, BLOOD: Lipase: 84 U/L — ABNORMAL HIGH (ref 11–51)

## 2023-11-30 LAB — C DIFFICILE QUICK SCREEN W PCR REFLEX
C Diff antigen: NEGATIVE
C Diff interpretation: NOT DETECTED
C Diff toxin: NEGATIVE

## 2023-11-30 MED ORDER — SODIUM CHLORIDE 0.9 % IV BOLUS
1000.0000 mL | Freq: Once | INTRAVENOUS | Status: AC
  Administered 2023-11-30: 1000 mL via INTRAVENOUS

## 2023-11-30 MED ORDER — POTASSIUM CHLORIDE 10 MEQ/100ML IV SOLN
10.0000 meq | Freq: Once | INTRAVENOUS | Status: DC
  Filled 2023-11-30: qty 100

## 2023-11-30 MED ORDER — MAGNESIUM SULFATE 2 GM/50ML IV SOLN
2.0000 g | Freq: Once | INTRAVENOUS | Status: AC
  Administered 2023-11-30: 2 g via INTRAVENOUS
  Filled 2023-11-30: qty 50

## 2023-11-30 MED ORDER — METOCLOPRAMIDE HCL 5 MG/ML IJ SOLN
10.0000 mg | Freq: Once | INTRAMUSCULAR | Status: AC
  Administered 2023-11-30: 10 mg via INTRAVENOUS
  Filled 2023-11-30: qty 2

## 2023-11-30 MED ORDER — POTASSIUM CHLORIDE CRYS ER 20 MEQ PO TBCR
40.0000 meq | EXTENDED_RELEASE_TABLET | Freq: Once | ORAL | Status: AC
  Administered 2023-11-30: 40 meq via ORAL
  Filled 2023-11-30: qty 2

## 2023-11-30 MED ORDER — ONDANSETRON 4 MG PO TBDP
4.0000 mg | ORAL_TABLET | Freq: Three times a day (TID) | ORAL | 0 refills | Status: AC | PRN
  Filled 2023-11-30: qty 15, 5d supply, fill #0

## 2023-11-30 MED ORDER — ONDANSETRON HCL 4 MG/2ML IJ SOLN
4.0000 mg | Freq: Once | INTRAMUSCULAR | Status: AC
  Administered 2023-11-30: 4 mg via INTRAVENOUS
  Filled 2023-11-30: qty 2

## 2023-11-30 MED ORDER — DIPHENHYDRAMINE HCL 50 MG/ML IJ SOLN
12.5000 mg | Freq: Once | INTRAMUSCULAR | Status: AC
  Administered 2023-11-30: 12.5 mg via INTRAVENOUS
  Filled 2023-11-30: qty 1

## 2023-11-30 MED ORDER — MORPHINE SULFATE (PF) 4 MG/ML IV SOLN
4.0000 mg | Freq: Once | INTRAVENOUS | Status: AC
  Administered 2023-11-30: 4 mg via INTRAVENOUS
  Filled 2023-11-30: qty 1

## 2023-11-30 MED ORDER — IOHEXOL 350 MG/ML SOLN
75.0000 mL | Freq: Once | INTRAVENOUS | Status: AC | PRN
Start: 2023-11-30 — End: 2023-11-30
  Administered 2023-11-30: 75 mL via INTRAVENOUS

## 2023-11-30 NOTE — ED Triage Notes (Signed)
 Pt c.o generalized abd pain, n/v/d since yesterday afternoon. Pt took 1 zofran this morning with some relief but she started vomiting again a few hours later. Pt states she is on chronic abx for chronic UTIs but she did not take it last night. Pt did not take any of her morning meds due to her vomiting. Denies chills/fever

## 2023-11-30 NOTE — Discharge Instructions (Addendum)
 You were seen for your viral bug (gastroenteritis) in the emergency department. You ct scan and c diff tests were reassuring.   At home, please take the Zofran for your nausea and vomiting. Please be sure to stay well-hydrated.  Follow-up with your primary doctor in 2-3 days regarding your visit.  Return immediately to the emergency department if you experience any of the following: fainting, abdominal pain, high fevers, or any other concerning symptoms.  Thank you for visiting our Emergency Department. It was a pleasure taking care of you today.

## 2023-11-30 NOTE — ED Provider Notes (Signed)
 [place] EMERGENCY DEPARTMENT AT [facility] Provider Note   CSN: [id number] Arrival date & time: 11/30/23  1155     History  Chief Complaint  Patient presents with   Abdominal Pain   Emesis   Diarrhea    Carolyn Phillips is a 48 y.o. female.  Pt is a 48 yo female with pmhx significant for WPW, HTN, DM, GERD, obesity, and chronic UTIs on abx suppression.  Pt developed n/v/d yesterday.  She tried zofran this am which helped for a few minutes, but then she started vomiting again.  She said she was up all night due to her GI sx.  She may have had a fever at home.       Home Medications Prior to Admission medications   Medication Sig Start Date End Date Taking? Authorizing Provider  acetaminophen (TYLENOL) 500 MG tablet Take 1,000 mg by mouth every 6 (six) hours as needed for mild pain.    [provider]  ALPRAZolam Prudy Feeler) 0.25 MG tablet Take 1 tablet (0.25 mg total) by mouth 2 (two) times daily as needed for anxiety. 03/01/21     ALPRAZolam (XANAX) 0.25 MG tablet Take 1 tablet (0.25 mg total) by mouth 2 (two) times daily as needed for anxiety 03/04/22     ALPRAZolam (XANAX) 0.25 MG tablet Take 1 tablet (0.25 mg total) by mouth 2 (two) times daily as needed for anxiety. 12/05/22     ALPRAZolam (XANAX) 0.25 MG tablet Take 1 tablet (0.25 mg total) by mouth 2 (two) times daily between meals as needed. 03/14/23     ALPRAZolam (XANAX) 0.25 MG tablet Take 1 tablet (0.25 mg total) by mouth 2 (two) times daily as needed for anxiety for 30 days. 04/08/23     ALPRAZolam (XANAX) 0.25 MG tablet Take 1 tablet (0.25 mg total) by mouth twice daily as needed for anxiety. 04/24/23     ALPRAZolam (XANAX) 0.25 MG tablet Take 1 tablet (0.25 mg total) by mouth 2 (two) times daily as needed for anxiety. 07/30/23     ALPRAZolam (XANAX) 0.25 MG tablet Take 1 tablet (0.25 mg total) by mouth 2 (two) times daily as needed for anxiety 10/20/23     Biotin 2500 MCG CAPS Take 2,500 mcg by mouth  daily.     [provider]  cefdinir (OMNICEF) 300 MG capsule Take 1 capsule (300 mg total) by mouth 2 (two) times daily. 11/14/23   Joline Maxcy, MD  cephALEXin (KEFLEX) 250 MG capsule Take 1 capsule (250 mg total) by mouth at bedtime. 11/18/23   Joline Maxcy, MD  Cholecalciferol (VITAMIN D3) 125 MCG (5000 UT) CAPS Take 5,000 Units by mouth daily.     [provider]  Continuous Glucose Sensor (FREESTYLE LIBRE 3 PLUS SENSOR) MISC Apply one sensor every 15 days to continuously monitor blood glucose. 07/14/23   Alysia Penna, MD  Continuous Glucose Sensor (FREESTYLE LIBRE 3 SENSOR) MISC Change sensor topically every 14 (fourteen) days to monitor blood glucose continuously 01/06/23   Alysia Penna, MD  Continuous Glucose Sensor (FREESTYLE LIBRE 3 SENSOR) MISC Change sensor every 14 days to monitor blood glucose continuously 01/05/23   Alysia Penna, MD  cyclobenzaprine (FLEXERIL) 5 MG tablet Take 1 tablet (5 mg total) by mouth 3 (three) times daily as needed for muscle spasms. 07/25/19   Wallis Bamberg, PA-C  Eflornithine HCl (VANIQA) 13.9 % cream Apply 1 application topically 2 (two) times daily as needed (acne).    [provider]  empagliflozin (JARDIANCE) 25 MG TABS tablet Take 1 tablet (25 mg total) by mouth in the morning. 02/06/23     estradiol (ESTRACE) 0.1 MG/GM vaginal cream Apply 3 times weekly as directed using a pea-sized amount on fingertip. 11/18/23   Joline Maxcy, MD  famotidine (PEPCID) 20 MG tablet Take 20 mg by mouth 2 (two) times daily as needed for heartburn or indigestion.    [provider]  famotidine (PEPCID) 20 MG tablet Take 1 tablet (20 mg total) by mouth 2 (two) times daily as needed for reflux. 06/18/23     flecainide (TAMBOCOR) 50 MG tablet Take 1 tablet (50 mg total) by mouth 2 (two) times daily. 04/07/23   Marinus Maw, MD  fluticasone (FLONASE) 50 MCG/ACT nasal spray Place 2 sprays into both nostrils daily. Patient taking  differently: Place 2 sprays into both nostrils daily as needed for allergies. 08/03/18   Zachery Dauer, NP  glucose blood (FREESTYLE LITE) test strip Use to check blood sugar 1 time per day. Dx Code: E11.9 11/01/16   Romero Belling, MD  hydrochlorothiazide (HYDRODIURIL) 25 MG tablet Take 1 tablet (25 mg total) by mouth daily for blood pressure /swelling 05/01/21     hydrochlorothiazide (HYDRODIURIL) 25 MG tablet TAKE 1 TABLET BY MOUTH ONCE A DAY FOR BLOOD PRESSURE/SWELLING. 11/10/22     hydrocortisone (ANUSOL-HC) 25 MG suppository Place 1 suppository (25 mg total) rectally at bedtime for 14 days. 01/15/22     Hyoscyamine Sulfate SL 0.125 MG SUBL Place 1 tablet ( 0.125 mg) under the tongue and allow to dissolve 2 (two) times daily as needed. 01/18/22     ibuprofen (ADVIL) 800 MG tablet Take 1 tablet (800 mg total) by mouth 4 (four) times daily as needed for pain 08/07/21     ibuprofen (ADVIL,MOTRIN) 800 MG tablet Take 1 tablet (800 mg total) by mouth 3 (three) times daily. 11/15/18   Eustace Moore, MD  insulin glargine, 2 Unit Dial, (TOUJEO MAX SOLOSTAR) 300 UNIT/ML Solostar Pen Inject 36 Units into the skin daily and increase as directed in clinic 06/05/23     insulin glargine, 2 Unit Dial, (TOUJEO MAX SOLOSTAR) 300 UNIT/ML Solostar Pen Inject 36 Units (up to 70 units) into the skin daily. Increase as directed in clinic. 07/15/23     Insulin Pen Needle 32G X 4 MM MISC Use as directed once daily with [facility] 12/04/22     Lancets (FREESTYLE) lancets Use to check blood sugar 1 time per day. Dx Code: E11.9 11/01/16   Romero Belling, MD  levocetirizine (XYZAL) 5 MG tablet TAKE 1 TABLET BY MOUTH AT BEDTIME 10/19/23     meclizine (ANTIVERT) 25 MG tablet TAKE 1 TABLET ( 25MG ) BY MOUTH THREE TIME DAILY AS NEEDED FOR DIZZINESS    [provider]  methocarbamol (ROBAXIN) 500 MG tablet     [provider]  montelukast (SINGULAIR) 10 MG tablet Take 1 tablet (10 mg total) by mouth daily. 06/30/23     Multiple  Vitamins-Minerals (ZINC PO) Take 1 tablet by mouth daily.    [provider]  nebivolol (BYSTOLIC) 10 MG tablet Take 1 tablet (10 mg total) by mouth at bedtime. 04/07/23     ondansetron (ZOFRAN-ODT) 4 MG disintegrating tablet Take 1 tablet (4 mg total) by mouth every 8 (eight) hours as needed for nausea for up to 7 days 10/19/21     oxyCODONE (OXY IR/ROXICODONE) 5 MG immediate release tablet Take 1 tablet (5 mg total) by mouth every  6 (six) hours as needed for pain 10/19/21     pantoprazole (PROTONIX) 40 MG tablet Take 1 tablet (40 mg total) by mouth 2 (two) times daily for 30 days, THEN 1 tablet (40 mg total) daily. Patient not taking: Reported on 10/23/2021 10/16/21 08/11/22    predniSONE (STERAPRED UNI-PAK 21 TAB) 10 MG (21) TBPK tablet Take as directed daily with food for 6 days 10/08/23     rosuvastatin (CRESTOR) 5 MG tablet Take 1 tablet (5 mg total) by mouth daily. 11/20/22     Semaglutide (RYBELSUS) 14 MG TABS Take 1 tablet by mouth daily. 02/28/21   Romero Belling, MD  Sodium Sulfate-Mag Sulfate-KCl (SUTAB) [phone] MG TABS Use as directed 01/15/22   Kathi Der, MD  solifenacin (VESICARE) 10 MG tablet Take 1 tablet (10 mg total) by mouth daily. 10/02/23   Joline Maxcy, MD  Spacer/Aero-Holding Chambers (AEROCHAMBER PLUS) inhaler Use as instructed 10/17/17   Domenick Gong, MD  sulfamethoxazole-trimethoprim (BACTRIM) 400-80 MG tablet Take 1 tablet by mouth at bedtime. 10/02/23   Joline Maxcy, MD  tirzepatide [facility]) 2.5 MG/0.5ML Pen Inject 2.5 mg into the skin once a week. 06/05/23         Allergies    Penicillins, Metoprolol, Nitrofurantoin macrocrystal, and Other    Review of Systems   Review of Systems  Gastrointestinal:  Positive for diarrhea, nausea and vomiting.  All other systems reviewed and are negative.   Physical Exam Updated Vital Signs BP 133/70   Pulse 90   Temp 99.1 F (37.3 C)   Resp (!) 23   Ht 5\' 3"  (1.6 m)   Wt 85.7 kg   SpO2 100%   BMI 33.48  kg/m  Physical Exam Vitals and nursing note reviewed.  Constitutional:      Appearance: She is well-developed.  HENT:     Head: Normocephalic and atraumatic.     Mouth/Throat:     Mouth: Mucous membranes are dry.     Pharynx: Oropharynx is clear.  Eyes:     Extraocular Movements: Extraocular movements intact.     Pupils: Pupils are equal, round, and reactive to light.  Cardiovascular:     Rate and Rhythm: Normal rate and regular rhythm.     Heart sounds: Normal heart sounds.  Pulmonary:     Effort: Pulmonary effort is normal.     Breath sounds: Normal breath sounds.  Abdominal:     General: Abdomen is flat. Bowel sounds are normal.     Palpations: Abdomen is soft.     Tenderness: There is generalized abdominal tenderness.  Skin:    General: Skin is warm.     Capillary Refill: Capillary refill takes less than 2 seconds.  Neurological:     General: No focal deficit present.     Mental Status: She is alert and oriented to person, place, and time.  Psychiatric:        Mood and Affect: Mood normal.        Behavior: Behavior normal.     ED Results / Procedures / Treatments   Labs (all labs ordered are listed, but only abnormal results are displayed) Labs Reviewed  LIPASE, BLOOD - Abnormal; Notable for the following components:      Result Value   Lipase 84 (*)    All other components within normal limits  COMPREHENSIVE METABOLIC PANEL - Abnormal; Notable for the following components:   Potassium 3.4 (*)    Glucose, Bld 243 (*)    All  other components within normal limits  CBC - Abnormal; Notable for the following components:   RBC 5.96 (*)    MCV 74.0 (*)    MCH 23.3 (*)    RDW 16.1 (*)    All other components within normal limits  URINALYSIS, ROUTINE W REFLEX MICROSCOPIC - Abnormal; Notable for the following components:   APPearance CLOUDY (*)    Glucose, UA 50 (*)    Hgb urine dipstick SMALL (*)    Protein, ur 100 (*)    Bacteria, UA MANY (*)    All other  components within normal limits  MAGNESIUM - Abnormal; Notable for the following components:   Magnesium 1.6 (*)    All other components within normal limits  RESP PANEL BY RT-PCR (RSV, FLU A&B, COVID)  [id number]  C DIFFICILE QUICK SCREEN W PCR REFLEX    GASTROINTESTINAL PANEL BY PCR, STOOL (REPLACES STOOL CULTURE)    EKG None  Radiology No results found.  Procedures Procedures    Medications Ordered in ED Medications  magnesium sulfate IVPB 2 g 50 mL (has no administration in time range)  potassium chloride 10 mEq in 100 mL IVPB (has no administration in time range)  sodium chloride 0.9 % bolus 1,000 mL (0 mLs Intravenous Stopped 11/30/23 1459)  ondansetron (ZOFRAN) injection 4 mg (4 mg Intravenous Given 11/30/23 1253)  morphine (PF) 4 MG/ML injection 4 mg (4 mg Intravenous Given 11/30/23 1317)    ED Course/ Medical Decision Making/ A&P Clinical Course as of 11/30/23 1604  Sun Nov 30, 2023  1603 Assumed care from Dr Particia Nearing. 48 yo F with frequent UTIs on supressive therapy. Was having diarrhea. Did have abdominal ttp on exam. Awaiting CT abdomen pelvis. If normal and passes PO challenge can be dc'd. Has contaminated urine but not having any urinary symptoms so was not planning on treating.  [RP]    Clinical Course User Index [RP] Rondel Baton, MD                                 Medical Decision Making Amount and/or Complexity of Data Reviewed Labs: ordered. Radiology: ordered.  Risk Prescription drug management.   This patient presents to the ED for concern of n/v/d, this involves an extensive number of treatment options, and is a complaint that carries with it a high risk of complications and morbidity.  The differential diagnosis includes covid/flu/rsv; c diff, norovirus, other gi bacteria/virus, colitis   Co morbidities that complicate the patient evaluation  WPW, HTN, DM, GERD, obesity, and chronic UTIs on abx suppression   Additional history  obtained:  Additional history obtained from epic chart review  Lab Tests:  I Ordered, and personally interpreted labs.  The pertinent results include:  cmp nl other than glucose elevated at 243, ua +glucose, protein +contaminated, so no uti; lip elevated at 84, cbc nl   Imaging Studies ordered:  I ordered imaging studies including ct abd/pelvis  Pending at shift change   Cardiac Monitoring:  The patient was maintained on a cardiac monitor.  I personally viewed and interpreted the cardiac monitored which showed an underlying rhythm of: nsr   Medicines ordered and prescription drug management:  I ordered medication including ivfs/k and mg  for sx  Reevaluation of the patient after these medicines showed that the patient improved I have reviewed the patients home medicines and have made adjustments as needed   Test Considered:  ct   Critical Interventions:  ivfs   Problem List / ED Course:  N/v/d:  pt given ivfs and meds.  Ct pending at shift change.   Reevaluation:  After the interventions noted above, I reevaluated the patient and found that they have :improved   Social Determinants of Health:  Lives at hoome   Dispostion:  After consideration of the diagnostic results and the patients response to treatment, I feel that the patent would benefit from discharge with outpatient f/u.          Final Clinical Impression(s) / ED Diagnoses Final diagnoses:  Dehydration  Nausea vomiting and diarrhea    Rx / DC Orders ED Discharge Orders     None         Jacalyn Lefevre, MD 11/30/23 [phone]

## 2023-11-30 NOTE — ED Notes (Signed)
 Patient states she don't think she can eat or drink she took a sip and feels like she have to have a bowel movement.

## 2023-11-30 NOTE — ED Provider Notes (Signed)
  Physical Exam  BP 133/70   Pulse 90   Temp 99.1 F (37.3 C)   Resp (!) 23   Ht 5\' 3"  (1.6 m)   Wt 85.7 kg   SpO2 100%   BMI 33.48 kg/m   Physical Exam  Procedures  Procedures  ED Course / MDM   Clinical Course as of 11/30/23 1604  Sun Nov 30, 2023  1603 Assumed care from Dr Particia Nearing. 48 yo F with frequent UTIs on supressive therapy. Was having diarrhea. Did have abdominal ttp on exam. Awaiting CT abdomen pelvis. If normal and passes PO challenge can be dc'd. Has contaminated urine but not having any urinary symptoms so was not planning on treating.  [RP]    Clinical Course User Index [RP] Rondel Baton, MD   Medical Decision Making Amount and/or Complexity of Data Reviewed Labs: ordered. Radiology: ordered.  Risk Prescription drug management.   ***

## 2023-12-01 ENCOUNTER — Other Ambulatory Visit (HOSPITAL_COMMUNITY): Payer: Self-pay

## 2023-12-02 ENCOUNTER — Other Ambulatory Visit (HOSPITAL_COMMUNITY): Payer: Self-pay

## 2023-12-02 LAB — GASTROINTESTINAL PANEL BY PCR, STOOL (REPLACES STOOL CULTURE)

## 2023-12-05 ENCOUNTER — Other Ambulatory Visit (HOSPITAL_COMMUNITY): Payer: Self-pay

## 2023-12-07 ENCOUNTER — Other Ambulatory Visit: Payer: Self-pay | Admitting: Medical Genetics

## 2023-12-07 ENCOUNTER — Encounter (INDEPENDENT_AMBULATORY_CARE_PROVIDER_SITE_OTHER): Payer: Self-pay

## 2023-12-10 ENCOUNTER — Ambulatory Visit: Admitting: Physician Assistant

## 2023-12-10 ENCOUNTER — Other Ambulatory Visit (HOSPITAL_COMMUNITY): Payer: Self-pay

## 2023-12-10 ENCOUNTER — Other Ambulatory Visit (INDEPENDENT_AMBULATORY_CARE_PROVIDER_SITE_OTHER)

## 2023-12-10 ENCOUNTER — Encounter: Payer: Self-pay | Admitting: Physician Assistant

## 2023-12-10 VITALS — BP 122/80 | HR 76 | Ht 62.0 in | Wt 195.6 lb

## 2023-12-10 DIAGNOSIS — K760 Fatty (change of) liver, not elsewhere classified: Secondary | ICD-10-CM | POA: Diagnosis not present

## 2023-12-10 DIAGNOSIS — F419 Anxiety disorder, unspecified: Secondary | ICD-10-CM | POA: Diagnosis not present

## 2023-12-10 DIAGNOSIS — K580 Irritable bowel syndrome with diarrhea: Secondary | ICD-10-CM

## 2023-12-10 DIAGNOSIS — K219 Gastro-esophageal reflux disease without esophagitis: Secondary | ICD-10-CM

## 2023-12-10 DIAGNOSIS — F1090 Alcohol use, unspecified, uncomplicated: Secondary | ICD-10-CM | POA: Diagnosis not present

## 2023-12-10 DIAGNOSIS — R140 Abdominal distension (gaseous): Secondary | ICD-10-CM

## 2023-12-10 DIAGNOSIS — I2510 Atherosclerotic heart disease of native coronary artery without angina pectoris: Secondary | ICD-10-CM | POA: Diagnosis not present

## 2023-12-10 DIAGNOSIS — Z860100 Personal history of colon polyps, unspecified: Secondary | ICD-10-CM

## 2023-12-10 DIAGNOSIS — R1084 Generalized abdominal pain: Secondary | ICD-10-CM

## 2023-12-10 DIAGNOSIS — E669 Obesity, unspecified: Secondary | ICD-10-CM | POA: Diagnosis not present

## 2023-12-10 DIAGNOSIS — R109 Unspecified abdominal pain: Secondary | ICD-10-CM

## 2023-12-10 DIAGNOSIS — Z0000 Encounter for general adult medical examination without abnormal findings: Secondary | ICD-10-CM | POA: Diagnosis not present

## 2023-12-10 DIAGNOSIS — R630 Anorexia: Secondary | ICD-10-CM

## 2023-12-10 DIAGNOSIS — Z1339 Encounter for screening examination for other mental health and behavioral disorders: Secondary | ICD-10-CM | POA: Diagnosis not present

## 2023-12-10 DIAGNOSIS — I10 Essential (primary) hypertension: Secondary | ICD-10-CM | POA: Diagnosis not present

## 2023-12-10 DIAGNOSIS — E1169 Type 2 diabetes mellitus with other specified complication: Secondary | ICD-10-CM | POA: Diagnosis not present

## 2023-12-10 DIAGNOSIS — Z794 Long term (current) use of insulin: Secondary | ICD-10-CM | POA: Diagnosis not present

## 2023-12-10 DIAGNOSIS — Z1331 Encounter for screening for depression: Secondary | ICD-10-CM | POA: Diagnosis not present

## 2023-12-10 DIAGNOSIS — I456 Pre-excitation syndrome: Secondary | ICD-10-CM | POA: Diagnosis not present

## 2023-12-10 DIAGNOSIS — Z860101 Personal history of adenomatous and serrated colon polyps: Secondary | ICD-10-CM

## 2023-12-10 DIAGNOSIS — M1990 Unspecified osteoarthritis, unspecified site: Secondary | ICD-10-CM | POA: Diagnosis not present

## 2023-12-10 DIAGNOSIS — I493 Ventricular premature depolarization: Secondary | ICD-10-CM | POA: Diagnosis not present

## 2023-12-10 LAB — CBC WITH DIFFERENTIAL/PLATELET
Basophils Absolute: 0.1 10*3/uL (ref 0.0–0.1)
Basophils Relative: 0.8 % (ref 0.0–3.0)
Eosinophils Absolute: 0.1 10*3/uL (ref 0.0–0.7)
Eosinophils Relative: 1.6 % (ref 0.0–5.0)
HCT: 39.5 % (ref 36.0–46.0)
Hemoglobin: 12.7 g/dL (ref 12.0–15.0)
Lymphocytes Relative: 40.3 % (ref 12.0–46.0)
Lymphs Abs: 2.7 10*3/uL (ref 0.7–4.0)
MCHC: 32.2 g/dL (ref 30.0–36.0)
MCV: 73 fl — ABNORMAL LOW (ref 78.0–100.0)
Monocytes Absolute: 0.4 10*3/uL (ref 0.1–1.0)
Monocytes Relative: 5.6 % (ref 3.0–12.0)
Neutro Abs: 3.5 10*3/uL (ref 1.4–7.7)
Neutrophils Relative %: 51.7 % (ref 43.0–77.0)
Platelets: 391 10*3/uL (ref 150.0–400.0)
RBC: 5.42 Mil/uL — ABNORMAL HIGH (ref 3.87–5.11)
RDW: 15.8 % — ABNORMAL HIGH (ref 11.5–15.5)
WBC: 6.8 10*3/uL (ref 4.0–10.5)

## 2023-12-10 LAB — COMPREHENSIVE METABOLIC PANEL
ALT: 37 U/L — ABNORMAL HIGH (ref 0–35)
AST: 28 U/L (ref 0–37)
Albumin: 4.2 g/dL (ref 3.5–5.2)
Alkaline Phosphatase: 77 U/L (ref 39–117)
BUN: 8 mg/dL (ref 6–23)
CO2: 31 meq/L (ref 19–32)
Calcium: 9.6 mg/dL (ref 8.4–10.5)
Chloride: 97 meq/L (ref 96–112)
Creatinine, Ser: 0.8 mg/dL (ref 0.40–1.20)
GFR: 86.54 mL/min (ref >60.00)
Glucose, Bld: 317 mg/dL — ABNORMAL HIGH (ref 70–99)
Potassium: 3.4 meq/L — ABNORMAL LOW (ref 3.5–5.1)
Sodium: 135 meq/L (ref 135–145)
Total Bilirubin: 0.5 mg/dL (ref 0.2–1.2)
Total Protein: 7.2 g/dL (ref 6.0–8.3)

## 2023-12-10 LAB — SEDIMENTATION RATE: Sed Rate: 60 mm/h — ABNORMAL HIGH (ref 0–20)

## 2023-12-10 LAB — HIGH SENSITIVITY CRP: CRP, High Sensitivity: 2.33 mg/L (ref 0.000–5.000)

## 2023-12-10 MED ORDER — HYOSCYAMINE SULFATE 0.125 MG PO TABS
0.1250 mg | ORAL_TABLET | Freq: Four times a day (QID) | ORAL | 0 refills | Status: DC | PRN
  Filled 2023-12-10: qty 30, 8d supply, fill #0

## 2023-12-10 MED ORDER — PANTOPRAZOLE SODIUM 20 MG PO TBEC
20.0000 mg | DELAYED_RELEASE_TABLET | Freq: Every day | ORAL | 3 refills | Status: DC
  Filled 2023-12-10: qty 90, 90d supply, fill #0

## 2023-12-10 MED ORDER — TOUJEO MAX SOLOSTAR 300 UNIT/ML ~~LOC~~ SOPN
46.0000 [IU] | PEN_INJECTOR | Freq: Every day | SUBCUTANEOUS | 11 refills | Status: DC
  Filled 2023-12-10: qty 6, 25d supply, fill #0
  Filled 2024-01-12: qty 6, 25d supply, fill #1
  Filled 2024-05-17: qty 6, 25d supply, fill #2

## 2023-12-10 MED ORDER — RIFAXIMIN 550 MG PO TABS
550.0000 mg | ORAL_TABLET | Freq: Three times a day (TID) | ORAL | 0 refills | Status: AC
  Filled 2023-12-10 – 2023-12-19 (×3): qty 42, 14d supply, fill #0

## 2023-12-10 NOTE — Progress Notes (Addendum)
 12/10/2023 Carolyn Phillips [id number] 07-09-1976  Referring provider: Alysia Penna, MD Primary GI doctor: Dr. Doy Hutching  ASSESSMENT AND PLAN:   GERD, bloating, decreased appetite, fatigue, AB pain Began Dec with UTI that presents with bloating and AB pain, has had multiple ABX since June with total of 8 ABX before that, has been treated since Dec with bactrim, cephalaxin, and keflex, had cystoscopy that was unremarkable.  Has been on mounjaro for 9 months, did not take it at all last week.  She has severe AB cramping mid Jan with urgency S/p norovirus 11/30/23 with CT that was unremarkable, negative Cdiff  -Suspect possible from [facility], discuss with PCP trial -Suspect SIBO with antibiotics, trial of Xifaxan may need repeat dose, will on ABX which makes it less effective but patient would like to do trial -Suspect post infectious IBS and possible post infectious gastroparesis, given information about diet -Consider GES with DM if symptoms continue - increase PPI to BID -Consider EGD/Colon next OV pending reports from [facility] and response to medications  Screening colonoscopy At [facility] 2023 with polyps Will get records and recall  Hepatic steatosis  seen on CT abdomen pelvis in the ER in March Check LFTs, avoid ETOH, has not had elevation  I have reviewed the clinic note as outlined by Carolyn Mulling, PA and agree with the assessment, plan and medical decision making. [name] presents to the office today for evaluation of GERD, decreased appetite and bloating.  It is noteworthy that she has been on multiple antibiotics for UTI and has also had an enteric infection with norovirus.  Noted that she is taking Mounjaro that could be contributing to side effects.  I agree with the differential diagnosis as outlined above.  Can trial a course of Xifaxan for possible SIBO and adjust PPI.  Consider endoscopic evaluation if symptoms or not improving.  Agree with checking liver  enzymes in the setting of hepatic steatosis.  Carolyn Beach, MD    Patient Care Team: Alysia Penna, MD as PCP - General (Internal Medicine) Marinus Maw, MD as PCP - Cardiology (Cardiology)  HISTORY OF PRESENT ILLNESS: 48 y.o. female with a past medical history of [employer], hypertension, OSA, type 2 diabetes, urinary incontinence, dyslipidemia and GERD and others listed below presents for evaluation of GERD, decreased appetite and bloating.  Status post cholecystectomy and hysterectomy and repair of urinary bladder.  Recent ER visit 11/30/2023 due to diarrhea and dehydration patient had had multiple antibiotics for UTI C. difficile negative lipase slightly increased likely from vomiting liver function unremarkable potassium and magnesium are low GI pathogen positive for norovirus CT abdomen pelvis with contrast on 11/30/2023 showed fluid-filled stomach small bowel without evidence of obstruction or inflammation possibly affecting gastroenteritis no other abnormalities stable mild hepatic steatosis  Discussed the use of AI scribe software for clinical note transcription with the patient, who gave verbal consent to proceed.  History of Present Illness   The patient presents with severe fatigue and gastrointestinal symptoms.  She experiences severe abdominal pain, bloating, and distention, which sometimes radiates to her back. Her bowel habits have changed, with more urgent and loose stools, and she experiences significant abdominal cramping. A colonoscopy in April 2023 found precancerous tubular adenomatous polyps. She continues to have upper abdominal discomfort, heartburn, and indigestion, describing her stools as 'baby poop yellow'.  She experienced a norovirus infection, initially misdiagnosed as gastroenteritis, which caused watery loose stools for 12.5 hours. Post-infection, she continues to have  upper abdominal discomfort, heartburn, and indigestion.  She has a history of  bladder repair surgery and has been on numerous courses of antibiotics for UTIs since June. Despite treatment with multiple antibiotics, including Bactrim, Cefalexin, and Keflex, her symptoms have persisted. She reports chronic microscopic hematuria.  She reports experiencing extreme fatigue, sleeping 14 to 16 hours a day, and waking up feeling as if she could go back to sleep immediately. This fatigue has been ongoing since December 14th, with only about 14 days of feeling well since then.  She is currently on Keflex for chronic UTIs and takes Pepcid twice a day for her stomach. She also uses a probiotic and an estrogen cream. She has been on Mounjaro for about nine months but did not take it last week. She has beta thalassemia.  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 She has not returned to work due to her ongoing symptoms and fatigue.  No nausea, vomiting, or trouble swallowing. She reports heartburn, indigestion, and a sensation of food moving down her esophagus. No black stools or vaginal dryness.      She  reports that she has never smoked. She has never used smokeless tobacco. She reports current alcohol use. She reports that she does not use drugs.  RELEVANT GI HISTORY, IMAGING AND LABS: Results   RADIOLOGY CT abdomen pelvis: Normal (08/2023)  DIAGNOSTIC Colonoscopy: Precancerous tubular adenomatous polyps (12/2021) Cystoscopy: Normal      CBC    Component Value Date/Time   WBC 8.5 11/30/2023 1208   RBC 5.96 (H) 11/30/2023 1208   HGB 13.9 11/30/2023 1208   HCT 44.1 11/30/2023 1208   PLT 332 11/30/2023 1208   MCV 74.0 (L) 11/30/2023 1208   MCH 23.3 (L) 11/30/2023 1208   MCHC 31.5 11/30/2023 1208   RDW 16.1 (H) 11/30/2023 1208   LYMPHSABS 3.0 01/01/2022 2150   MONOABS 0.4 01/01/2022 2150   EOSABS 0.1 01/01/2022 2150   BASOSABS 0.0 01/01/2022 2150   Recent Labs    11/30/23 1208  HGB 13.9     CMP     Component Value Date/Time   NA 137 11/30/2023 1208   NA 139 03/01/2021 0816   K 3.4 (L) 11/30/2023 1208   CL 102 11/30/2023 1208   CO2 23 11/30/2023 1208   GLUCOSE 243 (H) 11/30/2023 1208   BUN 13 11/30/2023 1208   BUN 13 03/01/2021 0816   CREATININE 0.85 11/30/2023 1208   CALCIUM 9.1 11/30/2023 1208   PROT 7.2 11/30/2023 1208   ALBUMIN 3.6 11/30/2023 1208   AST 22 11/30/2023 1208   ALT 21 11/30/2023 1208   ALKPHOS 62 11/30/2023 1208   BILITOT 0.9 11/30/2023 1208   GFRNONAA >60 11/30/2023 1208   GFRAA >60 10/19/2017 0137      Latest Ref Rng & Units 11/30/2023   12:08 PM 01/01/2022    5:43 PM  Hepatic Function  Total Protein 6.5 - 8.1 g/dL 7.2  7.2   Albumin 3.5 - 5.0 g/dL 3.6  3.8   AST 15 - 41 U/L 22  17   ALT 0 - 44 U/L 21  20   Alk Phosphatase 38 - 126 U/L 62  64   Total Bilirubin 0.0 - 1.2 mg/dL 0.9  0.3       Current Medications:   Current Outpatient Medications (Endocrine & Metabolic):    insulin glargine, 2 Unit Dial, (TOUJEO MAX SOLOSTAR) 300 UNIT/ML  Solostar Pen, Inject 36 Units into the skin daily and increase as directed in clinic   tirzepatide [facility]) 2.5 MG/0.5ML Pen, Inject 2.5 mg into the skin once a week.   empagliflozin (JARDIANCE) 25 MG TABS tablet, Take 1 tablet (25 mg total) by mouth in the morning. (Patient not taking: Reported on 12/10/2023)   insulin glargine, 2 Unit Dial, (TOUJEO MAX SOLOSTAR) 300 UNIT/ML Solostar Pen, Inject 36 Units (up to 70 units) into the skin daily. Increase as directed in clinic. (Patient not taking: Reported on 12/10/2023)  Current Outpatient Medications (Cardiovascular):    flecainide (TAMBOCOR) 50 MG tablet, Take 1 tablet (50 mg total) by mouth 2 (two) times daily.   hydrochlorothiazide (HYDRODIURIL) 25 MG tablet, Take 1 tablet (25 mg total) by mouth daily for blood pressure /swelling   nebivolol (BYSTOLIC) 10 MG tablet, Take 1 tablet (10 mg total) by mouth at bedtime.   rosuvastatin (CRESTOR) 5 MG tablet,  Take 1 tablet (5 mg total) by mouth daily.  Current Outpatient Medications (Respiratory):    fluticasone (FLONASE) 50 MCG/ACT nasal spray, Place 2 sprays into both nostrils daily. (Patient taking differently: Place 2 sprays into both nostrils daily as needed for allergies.)   levocetirizine (XYZAL) 5 MG tablet, TAKE 1 TABLET BY MOUTH AT BEDTIME   montelukast (SINGULAIR) 10 MG tablet, Take 1 tablet (10 mg total) by mouth daily.  Current Outpatient Medications (Analgesics):    acetaminophen (TYLENOL) 500 MG tablet, Take 1,000 mg by mouth every 6 (six) hours as needed for mild pain.   ibuprofen (ADVIL,MOTRIN) 800 MG tablet, Take 1 tablet (800 mg total) by mouth 3 (three) times daily.   oxyCODONE (OXY IR/ROXICODONE) 5 MG immediate release tablet, Take 1 tablet (5 mg total) by mouth every 6 (six) hours as needed for pain   Current Outpatient Medications (Other):    ALPRAZolam (XANAX) 0.25 MG tablet, Take 1 tablet (0.25 mg total) by mouth 2 (two) times daily as needed for anxiety.   cephALEXin (KEFLEX) 250 MG capsule, Take 1 capsule (250 mg total) by mouth at bedtime.   Cholecalciferol (VITAMIN D3) 125 MCG (5000 UT) CAPS, Take 5,000 Units by mouth daily.    Continuous Glucose Sensor (FREESTYLE LIBRE 3 PLUS SENSOR) MISC, Apply one sensor every 15 days to continuously monitor blood glucose.   Continuous Glucose Sensor (FREESTYLE LIBRE 3 SENSOR) MISC, Change sensor topically every 14 (fourteen) days to monitor blood glucose continuously   Continuous Glucose Sensor (FREESTYLE LIBRE 3 SENSOR) MISC, Change sensor every 14 days to monitor blood glucose continuously   Eflornithine HCl (VANIQA) 13.9 % cream, Apply 1 application topically 2 (two) times daily as needed (acne).   estradiol (ESTRACE) 0.1 MG/GM vaginal cream, Apply 3 times weekly as directed using a pea-sized amount on fingertip.   famotidine (PEPCID) 20 MG tablet, Take 1 tablet (20 mg total) by mouth 2 (two) times daily as needed for reflux.    glucose blood (FREESTYLE LITE) test strip, Use to check blood sugar 1 time per day. Dx Code: E11.9   hydrocortisone (ANUSOL-HC) 25 MG suppository, Place 1 suppository (25 mg total) rectally at bedtime for 14 days.   hyoscyamine (LEVSIN) 0.125 MG tablet, Take 1 tablet (0.125 mg total) by mouth every 6 (six) hours as needed for cramping.   Hyoscyamine Sulfate SL 0.125 MG SUBL, Place 1 tablet ( 0.125 mg) under the tongue and allow to dissolve 2 (two) times daily as needed.   Insulin Pen Needle 32G X 4 MM MISC, Use as  directed once daily with Soliqua   Lancets (FREESTYLE) lancets, Use to check blood sugar 1 time per day. Dx Code: E11.9   meclizine (ANTIVERT) 25 MG tablet, TAKE 1 TABLET ( 25MG ) BY MOUTH THREE TIME DAILY AS NEEDED FOR DIZZINESS   Multiple Vitamins-Minerals (ZINC PO), Take 1 tablet by mouth daily.   ondansetron (ZOFRAN-ODT) 4 MG disintegrating tablet, Take 1 tablet (4 mg total) by mouth every 8 (eight) hours as needed for nausea for up to 7 days   pantoprazole (PROTONIX) 20 MG tablet, Take 1 tablet (20 mg total) by mouth daily before breakfast.   rifaximin (XIFAXAN) 550 MG TABS tablet, Take 1 tablet (550 mg total) by mouth 3 (three) times daily for 14 days.   Spacer/Aero-Holding Chambers (AEROCHAMBER PLUS) inhaler, Use as instructed   Sodium Sulfate-Mag Sulfate-KCl (SUTAB) [phone] MG TABS, Use as directed (Patient not taking: Reported on 12/10/2023)   solifenacin (VESICARE) 10 MG tablet, Take 1 tablet (10 mg total) by mouth daily. (Patient not taking: Reported on 12/10/2023)   sulfamethoxazole-trimethoprim (BACTRIM) 400-80 MG tablet, Take 1 tablet by mouth at bedtime. (Patient not taking: Reported on 12/10/2023)  Medical History:  Past Medical History:  Diagnosis Date   Beta thalassemia trait    Complication of anesthesia    Diabetes mellitus without complication (HCC)    Dysrhythmia    WPW-ablation   GERD (gastroesophageal reflux disease)    Headache    Hyperlipemia     Hypertension    Obesity    PONV (postoperative nausea and vomiting)    Wolff-Parkinson-White syndrome    Allergies:  Allergies  Allergen Reactions   Penicillins Hives and Swelling    Has patient had a PCN reaction causing immediate rash, facial/tongue/throat swelling, SOB or lightheadedness with hypotension: Yes Has patient had a PCN reaction causing severe rash involving mucus membranes or skin necrosis: No Has patient had a PCN reaction that required hospitalization No Has patient had a PCN reaction occurring within the last 10 years: No If all of the above answers are "NO", then may proceed with Cephalosporin use.   Eye swelling   Metoprolol     "LOL" Drugs cause hair to fall out   Nitrofurantoin Macrocrystal Other (See Comments)    Pt unsure of reaction    Other Nausea And Vomiting    General Anesthesia      Surgical History:  She  has a past surgical history that includes Abdominal hysterectomy; Bladder repair; Breast reduction surgery; Tubal ligation; supraventricular tachycardia ablation (N/A, 07/04/2014); Knee arthroscopy with drilling/microfracture (Left, 09/13/2015); Chondroplasty (Left, 09/13/2015); Reduction mammaplasty (Bilateral, 2001); and Cholecystectomy (N/A, 10/25/2021). Family History:  Her family history includes Breast cancer in her paternal aunt and sister; Breast cancer (age of onset: 53) in her cousin; Diabetes in her brother, mother, and sister; Heart attack in her maternal grandmother; Stroke in her maternal grandmother; Valvular heart disease in her mother.  REVIEW OF SYSTEMS  : All other systems reviewed and negative except where noted in the History of Present Illness.  PHYSICAL EXAM: BP 122/80   Pulse 76   Ht 5\' 2"  (1.575 m)   Wt 195 lb 9.6 oz (88.7 kg)   BMI 35.78 kg/m  Physical Exam   GENERAL APPEARANCE: Well nourished, in no apparent distress. HEENT: No cervical lymphadenopathy, unremarkable thyroid, sclerae anicteric, conjunctiva  pink. RESPIRATORY: Respiratory effort normal, breath sounds equal bilaterally without rales, rhonchi, or wheezing. CARDIO: Regular rate and rhythm with no murmurs, rubs, or gallops, peripheral pulses intact. ABDOMEN: Soft,  non-distended, active bowel sounds in all four quadrants, no tenderness to palpation, no rebound, no mass appreciated. Abdominal pain not related to muscle. Abdomen normal on deep breathing. RECTAL: Declines. MUSCULOSKELETAL: Full range of motion, normal gait, without edema. SKIN: Dry, intact without rashes or lesions. No jaundice. NEURO: Alert, oriented, no focal deficits. PSYCH: Cooperative, normal mood and affect.      Doree Albee, PA-C 3:10 PM

## 2023-12-10 NOTE — Patient Instructions (Addendum)
 Your provider has requested that you go to the basement level for lab work before leaving today. Press "B" on the elevator. The lab is located at the first door on the left as you exit the elevator.   Small intestinal bacterial overgrowth (SIBO) occurs when there is an abnormal increase in the overall bacterial population in the small intestine -- particularly types of bacteria not commonly found in that part of the digestive tract. Small intestinal bacterial overgrowth (SIBO) commonly results when a circumstance -- such as surgery or disease -- slows the passage of food and waste products in the digestive tract, creating a breeding ground for bacteria.  Signs and symptoms of SIBO often include: Loss of appetite Abdominal pain Nausea Bloating An uncomfortable feeling of fullness after eating Diarrhea or constipation, depending on the type of gas produced  What foods trigger SIBO? While foods aren't the original cause of SIBO, certain foods do encourage the overgrowth of the wrong bacteria in your small intestine. If you're feeding them their favorite foods, they're going to grow more, and that will trigger more of your SIBO symptoms. By the same token, you can help reduce the overgrowth by starving the problematic bacteria of their favorite foods. This strategy has led to a number of proposed SIBO eating plans. The plans vary, and so do individual results. But in general, they tend to recommend limiting carbohydrates.  These include: Sugars and sweeteners. Fruits and starchy vegetables. Dairy products. Grains.  There is a test for this we can do called a breath test, if you are positive we will treat you with an antibiotic to see if it helps.  Your symptoms are very suspicious for this condition, as discussed, we will start you on an antibiotic to see if this helps.   Gastroparesis Please do small frequent meals like 4-6 meals a day.  Eat and drink liquids at separate times.  Avoid high  fiber foods, cook your vegetables, avoid high fat food.  Suggest spreading protein throughout the day (greek yogurt, glucerna, soft meat, milk, eggs) Choose soft foods that you can mash with a fork When you are more symptomatic, change to pureed foods foods and liquids.  Consider reading "Living well with Gastroparesis" by Reuel Derby Gastroparesis is a condition in which food takes longer than normal to empty from the stomach. This condition is also known as delayed gastric emptying. It is usually a long-term (chronic) condition. There is no cure, but there are treatments and things that you can do at home to help relieve symptoms. Treating the underlying condition that causes gastroparesis can also help relieve symptoms What are the causes? In many cases, the cause of this condition is not known. Possible causes include: A hormone (endocrine) disorder, such as hypothyroidism or diabetes. A nervous system disease, such as Parkinson's disease or multiple sclerosis. Cancer, infection, or surgery that affects the stomach or vagus nerve. The vagus nerve runs from your chest, through your neck, and to the lower part of your brain. A connective tissue disorder, such as scleroderma. Certain medicines. What increases the risk? You are more likely to develop this condition if: You have certain disorders or diseases. These may include: An endocrine disorder. An eating disorder. Amyloidosis. Scleroderma. Parkinson's disease. Multiple sclerosis. Cancer or infection of the stomach or the vagus nerve. You have had surgery on your stomach or vagus nerve. You take certain medicines. You are female. What are the signs or symptoms? Symptoms of this condition include: Feeling full after  eating very little or a loss of appetite. Nausea, vomiting, or heartburn. Bloating of your abdomen. Inconsistent blood sugar (glucose) levels on blood tests. Unexplained weight loss. Acid from the stomach  coming up into the esophagus (gastroesophageal reflux). Sudden tightening (spasm) of the stomach, which can be painful. Symptoms may come and go. Some people may not notice any symptoms. How is this diagnosed? This condition is diagnosed with tests, such as: Tests that check how long it takes food to move through the stomach and intestines. These tests include: Upper gastrointestinal (GI) series. For this test, you drink a liquid that shows up well on X-rays, and then X-rays are taken of your intestines. Gastric emptying scintigraphy. For this test, you eat food that contains a small amount of radioactive material, and then scans are taken. Wireless capsule GI monitoring system. For this test, you swallow a pill (capsule) that records information about how foods and fluid move through your stomach. Gastric manometry. For this test, a tube is passed down your throat and into your stomach to measure electrical and muscular activity. Endoscopy. For this test, a long, thin tube with a camera and light on the end is passed down your throat and into your stomach to check for problems in your stomach lining. Ultrasound. This test uses sound waves to create images of the inside of your body. This can help rule out gallbladder disease or pancreatitis as a cause of your symptoms. How is this treated? There is no cure for this condition, but treatment and home care may relieve symptoms. Treatment may include: Treating the underlying cause. Managing your symptoms by making changes to your diet and exercise habits. Taking medicines to control nausea and vomiting and to stimulate stomach muscles. Getting food through a feeding tube in the hospital. This may be done in severe cases. Having surgery to insert a device called a gastric electrical stimulator into your body. This device helps improve stomach emptying and control nausea and vomiting. Follow these instructions at home: Take over-the-counter and  prescription medicines only as told by your health care provider. Follow instructions from your health care provider about eating or drinking restrictions. Your health care provider may recommend that you: Eat smaller meals more often. Eat low-fat foods. Eat low-fiber forms of high-fiber foods. For example, eat cooked vegetables instead of raw vegetables. Have only liquid foods instead of solid foods. Liquid foods are easier to digest. Drink enough fluid to keep your urine pale yellow. Exercise as often as told by your health care provider. Keep all follow-up visits. This is important. Contact a health care provider if you: Notice that your symptoms do not improve with treatment. Have new symptoms. Get help right away if you: Have severe pain in your abdomen that does not improve with treatment. Have nausea that is severe or does not go away. Vomit every time you drink fluids. Summary Gastroparesis is a long-term (chronic) condition in which food takes longer than normal to empty from the stomach. Symptoms include nausea, vomiting, heartburn, bloating of your abdomen, and loss of appetite. Eating smaller portions, low-fat foods, and low-fiber forms of high-fiber foods may help you manage your symptoms. Get help right away if you have severe pain in your abdomen. This information is not intended to replace advice given to you by your health care provider. Make sure you discuss any questions you have with your health care provider. Document Revised: 01/24/2020 Document Reviewed: 01/24/2020 Elsevier Patient Education  2021 [employer].  You may  have POST INFECTIOUS IBS OR IRRITABLE BOWEL After an infection or diverticulitis flare your intestines can spasm or be a little bit more sensitive. Try these things below:  Can do BRAT diet versus low FODMAP- see below Try trial off milk/lactose products.  Add fiber like benefiber or citracel once a day Can do trial of IBGard for AB pain EVERY  DAY- Take 1-2 capsules once a day for maintence or twice a day during a flare Can take dicyclomine as needed.  if any worsening symptoms like blood in stool, weight loss, please call the office or go to the ER.    FODMAP stands for fermentable oligo-, di-, mono-saccharides and polyols (1). These are the scientific terms used to classify groups of carbs that are notorious for triggering digestive symptoms like bloating, gas and stomach pain.

## 2023-12-11 ENCOUNTER — Other Ambulatory Visit (HOSPITAL_COMMUNITY)
Admission: RE | Admit: 2023-12-11 | Discharge: 2023-12-11 | Disposition: A | Discharge status: Home / Self Care | Payer: Self-pay | Source: Ambulatory Visit | Attending: Medical Genetics | Admitting: Medical Genetics

## 2023-12-11 ENCOUNTER — Other Ambulatory Visit (HOSPITAL_COMMUNITY): Payer: Self-pay

## 2023-12-11 LAB — TISSUE TRANSGLUTAMINASE, IGA: (tTG) Ab, IgA: <1 U/mL

## 2023-12-11 LAB — IGA: Immunoglobulin A: 226 mg/dL (ref 47–310)

## 2023-12-12 ENCOUNTER — Telehealth: Payer: Self-pay

## 2023-12-12 NOTE — Telephone Encounter (Signed)
 Pharmacy Patient Advocate Encounter   Received notification from CoverMyMeds that prior authorization for Xifaxan 550 mg tablets is required/requested.   Insurance verification completed.   The patient is insured through [facility] .   Per test claim: PA required; PA submitted to above mentioned insurance via CoverMyMeds Key/confirmation #/EOC [id number] Status is pending

## 2023-12-17 MED ORDER — VIBERZI 75 MG PO TABS: 75.0000 mg | ORAL_TABLET | Freq: Two times a day (BID) | ORAL | 0 refills | Status: DC

## 2023-12-17 NOTE — Telephone Encounter (Signed)
 Spoke with patient. Patient is aware that her insurance has Denied Xifaxan and requiring trial of Viberzi. Patient is aware that prescription was sent and we are working on getting denial overturned. Patient is aware that we will follow up with her once we have a response from her insurance.

## 2023-12-17 NOTE — Telephone Encounter (Signed)
 Monchell, we have sent a trial of Viberzi to patient's pharmacy. When can we resubmit PA for Rifaximin?

## 2023-12-17 NOTE — Telephone Encounter (Signed)
 Pharmacy Patient Advocate Encounter  Received notification from [facility] that Prior Authorization for [facility] 550MG  has been DENIED.  Full denial letter will be uploaded to the media tab. See denial reason below.    PA #/Case ID/Reference #: [id number]

## 2023-12-19 ENCOUNTER — Other Ambulatory Visit (HOSPITAL_COMMUNITY): Payer: Self-pay

## 2023-12-19 ENCOUNTER — Other Ambulatory Visit: Payer: Self-pay

## 2023-12-19 ENCOUNTER — Ambulatory Visit: Payer: Self-pay | Admitting: Allergy

## 2023-12-19 ENCOUNTER — Encounter: Payer: Self-pay | Admitting: Allergy

## 2023-12-19 VITALS — BP 110/80 | HR 68 | Temp 98.2°F | Resp 19 | Ht 62.75 in | Wt 194.8 lb

## 2023-12-19 DIAGNOSIS — J329 Chronic sinusitis, unspecified: Secondary | ICD-10-CM

## 2023-12-19 DIAGNOSIS — L853 Xerosis cutis: Secondary | ICD-10-CM

## 2023-12-19 DIAGNOSIS — J4520 Mild intermittent asthma, uncomplicated: Secondary | ICD-10-CM | POA: Diagnosis not present

## 2023-12-19 DIAGNOSIS — H109 Unspecified conjunctivitis: Secondary | ICD-10-CM | POA: Diagnosis not present

## 2023-12-19 DIAGNOSIS — J310 Chronic rhinitis: Secondary | ICD-10-CM

## 2023-12-19 DIAGNOSIS — K9049 Malabsorption due to intolerance, not elsewhere classified: Secondary | ICD-10-CM | POA: Diagnosis not present

## 2023-12-19 DIAGNOSIS — H1013 Acute atopic conjunctivitis, bilateral: Secondary | ICD-10-CM

## 2023-12-19 MED ORDER — RYALTRIS 665-25 MCG/ACT NA SUSP
2.0000 | Freq: Two times a day (BID) | NASAL | Status: DC | PRN
Start: 2023-12-19 — End: 2024-01-27

## 2023-12-19 MED ORDER — ALBUTEROL SULFATE HFA 108 (90 BASE) MCG/ACT IN AERS
2.0000 | INHALATION_SPRAY | RESPIRATORY_TRACT | 1 refills | Status: DC | PRN
  Filled 2023-12-19: qty 6.7, 17d supply, fill #0

## 2023-12-19 MED ORDER — RYALTRIS 665-25 MCG/ACT NA SUSP: 2.0000 | Freq: Two times a day (BID) | NASAL | 5 refills | Status: DC | PRN

## 2023-12-19 NOTE — Progress Notes (Signed)
 Medication Samples have been provided to the patient.  Drug name: Ryaltris       Strength:        Qty: 1  LOT: [id number]  Exp.Date: 12/28/2024  Dosing instructions: 2 sprays per nostril twice a day as needed.  The patient has been instructed regarding the correct time, dose, and frequency of taking this medication, including desired effects and most common side effects.   Glena Norfolk 10:06 AM 12/19/2023

## 2023-12-19 NOTE — Progress Notes (Signed)
 New Patient Note  RE: Carolyn Phillips MRN: [id number] DOB: Jul 28, 1976 Date of Office Visit: 12/19/2023  Primary care provider: Alysia Penna, MD  Chief Complaint: seasonal allergies  History of present illness: Carolyn Phillips is a 48 y.o. female presenting today for evaluation of allergic rhinitis.   Discussed the use of AI scribe software for clinical note transcription with the patient, who gave verbal consent to proceed.  She has a long-standing history of seasonal allergies with symptoms including itchy eyes, throat irritation, increased mucus, dry skin, sneezing, nasal congestion, itchy ears, and increased ear wax. These symptoms are present year-round but worsen in the spring, summer, and early fall. She uses air purifiers with HEPA filters at home to reduce allergen exposure.  She experiences frequent sinus infections, approximately four to five times a year, requiring antibiotics. These infections used to be predictable but now occur in all seasons. She has tried saline rinses and sprays in the past, which she continues to use to help manage symptoms.  Her current medication regimen includes Flonase once daily in the morning and sometimes at night during the summer months depending on outdoor exposure. She also takes Singulair in the morning and Xyzal at night, both of which she has been on for several years. Xyzal is effective at night as it prevents her from waking up to clear her throat.  She has a history of dry skin, which she manages with Vaseline-based products and Aveeno. Her skin dries out quickly after showering and becomes visibly dry by the end of the day, particularly on her hands, arms, and legs.  She is lactose intolerant and manages this with lactose-free products and lactase supplements. She also uses anti-gas pills as needed.  She denies however having any known food allergens.   She also reports following with GI after having norovirus and  developing gastric paralysis for which she is trying to get a medication covered for management of this.    She does have a history of illness driven reactive airway for which she has had an albuterol inhaler for.  Denies symptoms outside of illness.   Review of systems: 10pt ROS negative unless noted above in HPI  Past medical history: Past Medical History:  Diagnosis Date   Angio-edema    Asthma    Beta thalassemia trait    Complication of anesthesia    Diabetes mellitus without complication (HCC)    Dysrhythmia    WPW-ablation   Eczema    GERD (gastroesophageal reflux disease)    Headache    Hyperlipemia    Hypertension    Obesity    PONV (postoperative nausea and vomiting)    Recurrent upper respiratory infection (URI)    Urticaria    Wolff-Parkinson-White syndrome     Past surgical history: Past Surgical History:  Procedure Laterality Date   ABDOMINAL HYSTERECTOMY     BLADDER REPAIR     BREAST REDUCTION SURGERY     CHOLECYSTECTOMY N/A 10/25/2021   Procedure: LAPAROSCOPIC CHOLECYSTECTOMY WITH INTRAOPERATIVE CHOLANGIOGRAM;  Surgeon: Manus Rudd, MD;  Location: [facility] OR;  Service: General;  Laterality: N/A;   CHONDROPLASTY Left 09/13/2015   Procedure: CHONDROPLASTY;  Surgeon: Gean Birchwood, MD;  Location: [facility];  Service: Orthopedics;  Laterality: Left;   KNEE ARTHROSCOPY WITH DRILLING/MICROFRACTURE Left 09/13/2015   Procedure: KNEE ARTHROSCOPY WITH DRILLING/MICROFRACTURE;  Surgeon: Gean Birchwood, MD;  Location: [facility];  Service: Orthopedics;  Laterality: Left;   REDUCTION MAMMAPLASTY Bilateral 2001  SUPRAVENTRICULAR TACHYCARDIA ABLATION N/A 07/04/2014   Procedure: WPW ABLATION;  Surgeon: Marinus Maw, MD;  Location: [facility];  Service: Cardiovascular;  Laterality: N/A;   TUBAL LIGATION      Family history:  Family History  Problem Relation Age of Onset   Eczema Mother    Asthma Mother    Angioedema Mother    Allergic  rhinitis Mother    Valvular heart disease Mother        Aortic valve replacement   Diabetes Mother    Angioedema Sister    Diabetes Sister    Breast cancer Sister        Half Sister   Eczema Brother    Asthma Brother    Allergic rhinitis Brother    Diabetes Brother    Asthma Maternal Aunt    Angioedema Maternal Aunt    Allergic rhinitis Maternal Aunt    Asthma Maternal Aunt    Angioedema Maternal Aunt    Angioedema Maternal Aunt    Allergic rhinitis Maternal Uncle    Allergic rhinitis Paternal Aunt    Breast cancer Paternal Aunt    Angioedema Paternal Uncle    Allergic rhinitis Paternal Uncle    Heart attack Maternal Grandmother        56s   Stroke Maternal Grandmother    Breast cancer Cousin 35   Urticaria Son    Allergic rhinitis Son    Eczema Son    Asthma Son     Social history: Lives in a home with carpeting in the bedroom with electric heating and central cooling.  No pets in the home.  No concern for water damage, mildew or roaches in the home.  She is a Merchandiser, retail.  Denies smoking history.    Medication List: Current Outpatient Medications  Medication Sig Dispense Refill   ALPRAZolam (XANAX) 0.25 MG tablet Take 1 tablet (0.25 mg total) by mouth 2 (two) times daily as needed for anxiety. 60 tablet 3   cephALEXin (KEFLEX) 250 MG capsule Take 1 capsule (250 mg total) by mouth at bedtime. 30 capsule 30   Cholecalciferol (VITAMIN D3) 125 MCG (5000 UT) CAPS Take 5,000 Units by mouth daily.      Continuous Glucose Sensor (FREESTYLE LIBRE 3 PLUS SENSOR) MISC Apply one sensor every 15 days to continuously monitor blood glucose. 6 each 3   Continuous Glucose Sensor (FREESTYLE LIBRE 3 SENSOR) MISC Change sensor topically every 14 (fourteen) days to monitor blood glucose continuously 2 each 5   Continuous Glucose Sensor (FREESTYLE LIBRE 3 SENSOR) MISC Change sensor every 14 days to monitor blood glucose continuously 2 each 5   Eflornithine HCl (VANIQA) 13.9 % cream Apply 1  application topically 2 (two) times daily as needed (acne).     estradiol (ESTRACE) 0.1 MG/GM vaginal cream Apply 3 times weekly as directed using a pea-sized amount on fingertip. 42.5 g 12   famotidine (PEPCID) 20 MG tablet Take 1 tablet (20 mg total) by mouth 2 (two) times daily as needed for reflux. 60 tablet 3   flecainide (TAMBOCOR) 50 MG tablet Take 1 tablet (50 mg total) by mouth 2 (two) times daily. 180 tablet 1   fluticasone (FLONASE) 50 MCG/ACT nasal spray Place 2 sprays into both nostrils daily. (Patient taking differently: Place 2 sprays into both nostrils daily as needed for allergies.) 16 g 0   glucose blood (FREESTYLE LITE) test strip Use to check blood sugar 1 time per day. Dx Code: E11.9 100 each 2  hydrochlorothiazide (HYDRODIURIL) 25 MG tablet Take 1 tablet (25 mg total) by mouth daily for blood pressure /swelling 90 tablet 3   hydrocortisone (ANUSOL-HC) 25 MG suppository Place 1 suppository (25 mg total) rectally at bedtime for 14 days. 14 suppository 1   hyoscyamine (LEVSIN) 0.125 MG tablet Take 1 tablet (0.125 mg total) by mouth every 6 (six) hours as needed for cramping. 30 tablet 0   insulin glargine, 2 Unit Dial, (TOUJEO MAX SOLOSTAR) 300 UNIT/ML Solostar Pen inject 46 units daily Subcutaneous (increase by 2 units every week if average 6am is >150 up to a max dose of 70 units) 6 mL 11   Insulin Pen Needle 32G X 4 MM MISC Use as directed once daily with Soliqua 100 each 3   Lancets (FREESTYLE) lancets Use to check blood sugar 1 time per day. Dx Code: E11.9 100 each 2   levocetirizine (XYZAL) 5 MG tablet TAKE 1 TABLET BY MOUTH AT BEDTIME 90 tablet 4   meclizine (ANTIVERT) 25 MG tablet TAKE 1 TABLET ( 25MG ) BY MOUTH THREE TIME DAILY AS NEEDED FOR DIZZINESS     montelukast (SINGULAIR) 10 MG tablet Take 1 tablet (10 mg total) by mouth daily. 90 tablet 2   nebivolol (BYSTOLIC) 10 MG tablet Take 1 tablet (10 mg total) by mouth at bedtime. 90 tablet 2   ondansetron (ZOFRAN-ODT) 4 MG  disintegrating tablet Take 1 tablet (4 mg total) by mouth every 8 (eight) hours as needed for nausea for up to 7 days 15 tablet 0   pantoprazole (PROTONIX) 20 MG tablet Take 1 tablet (20 mg total) by mouth daily before breakfast. 90 tablet 3   rosuvastatin (CRESTOR) 5 MG tablet Take 1 tablet (5 mg total) by mouth daily. 90 tablet 3   tirzepatide (MOUNJARO) 2.5 MG/0.5ML Pen Inject 2.5 mg into the skin once a week. 2 mL 5   acetaminophen (TYLENOL) 500 MG tablet Take 1,000 mg by mouth every 6 (six) hours as needed for mild pain. (Patient not taking: Reported on 12/19/2023)     albuterol (VENTOLIN HFA) 108 (90 Base) MCG/ACT inhaler Inhale 2 puffs into the lungs. Every 4-6 hours as needed for shortness of breath/wheezes     Hyoscyamine Sulfate SL 0.125 MG SUBL Place 1 tablet ( 0.125 mg) under the tongue and allow to dissolve 2 (two) times daily as needed. (Patient not taking: Reported on 12/19/2023) 60 tablet 1   ibuprofen (ADVIL,MOTRIN) 800 MG tablet Take 1 tablet (800 mg total) by mouth 3 (three) times daily. (Patient not taking: Reported on 12/19/2023) 21 tablet 0   insulin glargine, 2 Unit Dial, (TOUJEO MAX SOLOSTAR) 300 UNIT/ML Solostar Pen Inject 36 Units into the skin daily and increase as directed in clinic 9 mL 5   insulin glargine, 2 Unit Dial, (TOUJEO MAX SOLOSTAR) 300 UNIT/ML Solostar Pen Inject 36 Units (up to 70 units) into the skin daily. Increase as directed in clinic. (Patient not taking: Reported on 12/19/2023) 9 mL 3   Multiple Vitamins-Minerals (ZINC PO) Take 1 tablet by mouth daily. (Patient not taking: Reported on 12/19/2023)     oxyCODONE (OXY IR/ROXICODONE) 5 MG immediate release tablet Take 1 tablet (5 mg total) by mouth every 6 (six) hours as needed for pain (Patient not taking: Reported on 12/19/2023) 20 tablet 0   rifaximin (XIFAXAN) 550 MG TABS tablet Take 1 tablet (550 mg total) by mouth 3 (three) times daily for 14 days. (Patient not taking: Reported on 12/19/2023) 42 tablet 0  Sodium Sulfate-Mag Sulfate-KCl (SUTAB) [phone] MG TABS Use as directed (Patient not taking: Reported on 12/19/2023) 24 tablet 0   Spacer/Aero-Holding Chambers (AEROCHAMBER PLUS) inhaler Use as instructed (Patient not taking: Reported on 12/19/2023) 1 each 2   VIBERZI 75 MG TABS Take 1 tablet (75 mg total) by mouth 2 (two) times daily before a meal. (Patient not taking: Reported on 12/19/2023) 60 tablet 0   No current facility-administered medications for this visit.    Known medication allergies: Allergies  Allergen Reactions   Penicillins Hives and Swelling    Has patient had a PCN reaction causing immediate rash, facial/tongue/throat swelling, SOB or lightheadedness with hypotension: Yes Has patient had a PCN reaction causing severe rash involving mucus membranes or skin necrosis: No Has patient had a PCN reaction that required hospitalization No Has patient had a PCN reaction occurring within the last 10 years: No If all of the above answers are "NO", then may proceed with Cephalosporin use.   Eye swelling   Metoprolol     "LOL" Drugs cause hair to fall out   Nitrofurantoin Macrocrystal Other (See Comments)    Pt unsure of reaction    Other Nausea And Vomiting    General Anesthesia      Physical examination: Blood pressure 110/80, pulse 68, temperature 98.2 F (36.8 C), temperature source Temporal, resp. rate 19, height 5' 2.75" (1.594 m), weight 194 lb 12.8 oz (88.4 kg), SpO2 95%.  General: Alert, interactive, in no acute distress. HEENT: PERRLA, lt TM with cerumen almost obscuring TM, rt TMs pearly gray, turbinates moderately edematous without discharge, post-pharynx non erythematous. Neck: Supple without lymphadenopathy. Lungs: Clear to auscultation without wheezing, rhonchi or rales. {no increased work of breathing. CV: Normal S1, S2 without murmurs. Abdomen: Nondistended, nontender. Skin: Warm and dry, without lesions or rashes. Extremities:  No clubbing, cyanosis or  edema. Neuro:   Grossly intact.  Diagnositics/Labs:  Spirometry: FEV1: 2.03L 91%, FVC: 2.36L 86%, ratio consistent with nonobstructive pattern  Assessment and plan:   Allergic Rhinitis with conjunctivitis Chronic allergic rhinitis with perennial symptoms, exacerbated in spring, summer, and early fall. Current management includes Flonase, Singulair, and Xyzal.  - Discontinue Flonase and initiate Ryaltris nasal spray.  This is a combination spray with Mometasone (steroid for congestion control) and Olopatadine (antihistamine for drainage control).  Use 2 sprays each nostril twice a day as needed.   With using nasal sprays point tip of bottle toward eye on same side nostril and lean head slightly forward for best technique.   - Recommend continuing nasal saline rinses couple times a week (can increase to daily use if needed).   - Continue Xyzal and Singulair, administer Singulair at night. - Perform blood work for allergy testing today. If blood work is negative then will schedule for skin testing visit  Recurrent Sinus Infections Reactive airway due to illness Recurrent sinus infections, approximately four to five annually, requiring antibiotics. - Increase frequency of saline rinses during infections. - Refill albuterol inhaler for use during illnesses.  Have access to albuterol inhaler 2 puffs every 4-6 hours as needed for cough/wheeze/shortness of breath/chest tightness.  May use 15-20 minutes prior to activity.   Monitor frequency of use.   - Lung function testing is normal  Dry Skin - Continue using Vaseline application after bathing - Continue to ensure adequate hydration.  Lactose Intolerance Lactose intolerance causing gastrointestinal symptoms, managed with lactose-free products and lactase supplements. - Continue using lactose-free products and lactase supplements as needed.  Follow-up in  3-4 months   I appreciate the opportunity to take part in Isidora's care. Please do  not hesitate to contact me with questions.  Sincerely,   Margo Aye, MD Allergy/Immunology [facility]

## 2023-12-19 NOTE — Patient Instructions (Addendum)
 Allergic Rhinitis with conjunctivitis Chronic allergic rhinitis with perennial symptoms, exacerbated in spring, summer, and early fall. Current management includes Flonase, Singulair, and Xyzal.  - Discontinue Flonase and initiate Ryaltris nasal spray.  This is a combination spray with Mometasone (steroid for congestion control) and Olopatadine (antihistamine for drainage control).  Use 2 sprays each nostril twice a day as needed.   With using nasal sprays point tip of bottle toward eye on same side nostril and lean head slightly forward for best technique.   - Recommend continuing nasal saline rinses couple times a week (can increase to daily use if needed).   - Continue Xyzal and Singulair, administer Singulair at night. - Perform blood work for allergy testing today. If blood work is negative then will schedule for skin testing visit  Recurrent Sinus Infections Reactive airway due to illness Recurrent sinus infections, approximately four to five annually, requiring antibiotics. - Increase frequency of saline rinses during infections. - Refill albuterol inhaler for use during illnesses.  Have access to albuterol inhaler 2 puffs every 4-6 hours as needed for cough/wheeze/shortness of breath/chest tightness.  May use 15-20 minutes prior to activity.   Monitor frequency of use.   - Lung function testing is normal  Dry Skin - Continue using Vaseline application after bathing - Continue to ensure adequate hydration.  Lactose Intolerance Lactose intolerance causing gastrointestinal symptoms, managed with lactose-free products and lactase supplements. - Continue using lactose-free products and lactase supplements as needed.  Follow-up in 3-4 months

## 2023-12-21 ENCOUNTER — Other Ambulatory Visit: Payer: Self-pay | Admitting: Internal Medicine

## 2023-12-21 ENCOUNTER — Other Ambulatory Visit (HOSPITAL_COMMUNITY): Payer: Self-pay

## 2023-12-21 DIAGNOSIS — I493 Ventricular premature depolarization: Secondary | ICD-10-CM

## 2023-12-22 ENCOUNTER — Other Ambulatory Visit: Payer: Self-pay

## 2023-12-22 ENCOUNTER — Other Ambulatory Visit (HOSPITAL_COMMUNITY): Payer: Self-pay

## 2023-12-22 MED ORDER — FLECAINIDE ACETATE 50 MG PO TABS
50.0000 mg | ORAL_TABLET | Freq: Two times a day (BID) | ORAL | 0 refills | Status: DC
  Filled 2023-12-22: qty 180, 90d supply, fill #0

## 2023-12-22 MED ORDER — HYDROCHLOROTHIAZIDE 25 MG PO TABS
25.0000 mg | ORAL_TABLET | Freq: Every day | ORAL | 3 refills | Status: DC
  Filled 2023-12-22: qty 90, 90d supply, fill #0
  Filled 2024-03-23: qty 90, 90d supply, fill #1
  Filled 2024-06-11: qty 90, 90d supply, fill #2
  Filled 2024-09-27: qty 90, 90d supply, fill #3

## 2023-12-23 ENCOUNTER — Other Ambulatory Visit: Payer: Self-pay

## 2023-12-23 LAB — GENECONNECT MOLECULAR SCREEN: Genetic Analysis Overall Interpretation: NEGATIVE

## 2023-12-27 LAB — ALLERGENS W/TOTAL IGE AREA 2
Alternaria Alternata IgE: 2.68 kU/L — AB
Aspergillus Fumigatus IgE: 0.52 kU/L — AB
Bermuda Grass IgE: 1.56 kU/L — AB
Cat Dander IgE: <0.1 kU/L
Cedar, Mountain IgE: 0.26 kU/L — AB
Cladosporium Herbarum IgE: 0.39 kU/L — AB
Cockroach, German IgE: 0.19 kU/L — AB
Common Silver Birch IgE: 0.2 kU/L — AB
Cottonwood IgE: 0.6 kU/L — AB
D Farinae IgE: 0.24 kU/L — AB
D Pteronyssinus IgE: 0.23 kU/L — AB
Dog Dander IgE: <0.1 kU/L
Elm, American IgE: 0.42 kU/L — AB
IgE (Immunoglobulin E), Serum: 147 [IU]/mL (ref 6–495)
Johnson Grass IgE: 5.56 kU/L — AB
Maple/Box Elder IgE: 0.38 kU/L — AB
Mouse Urine IgE: <0.1 kU/L
Oak, White IgE: 0.22 kU/L — AB
Pecan, Hickory IgE: 1.33 kU/L — AB
Penicillium Chrysogen IgE: 0.17 kU/L — AB
Pigweed, Rough IgE: 0.19 kU/L — AB
Ragweed, Short IgE: 0.8 kU/L — AB
Sheep Sorrel IgE Qn: 0.2 kU/L — AB
Timothy Grass IgE: 5.84 kU/L — AB
White Mulberry IgE: 0.15 kU/L — AB

## 2023-12-31 ENCOUNTER — Other Ambulatory Visit (HOSPITAL_COMMUNITY): Payer: Self-pay

## 2023-12-31 DIAGNOSIS — Z1231 Encounter for screening mammogram for malignant neoplasm of breast: Secondary | ICD-10-CM | POA: Diagnosis not present

## 2023-12-31 DIAGNOSIS — Z1272 Encounter for screening for malignant neoplasm of vagina: Secondary | ICD-10-CM | POA: Diagnosis not present

## 2023-12-31 DIAGNOSIS — Z01419 Encounter for gynecological examination (general) (routine) without abnormal findings: Secondary | ICD-10-CM | POA: Diagnosis not present

## 2023-12-31 MED ORDER — ESTRADIOL 0.05 MG/24HR TD PTTW
1.0000 | MEDICATED_PATCH | TRANSDERMAL | 4 refills | Status: DC
Start: 2024-01-01 — End: 2024-04-23
  Filled 2023-12-31: qty 24, 84d supply, fill #0

## 2024-01-01 ENCOUNTER — Encounter: Payer: Self-pay | Admitting: Allergy

## 2024-01-02 ENCOUNTER — Other Ambulatory Visit: Payer: Self-pay

## 2024-01-02 ENCOUNTER — Telehealth: Payer: Self-pay

## 2024-01-02 NOTE — Telephone Encounter (Signed)
 Pharmacy Patient Advocate Encounter   Received notification from Physician's Office that prior authorization for Xifaxan 550MG  tablets is required/requested.   Insurance verification completed.   The patient is insured through [facility] .   Prior Authorization for Xifaxan 550MG  tablets has been APPROVED from 01-02-2024 to 02-27-2024   PA #/Case ID/Reference #: [id number]

## 2024-01-02 NOTE — Telephone Encounter (Signed)
 Discussed with the patient, she states that she did not do well with Viberzi and had side effects. Please see if we can get the Xifaxan preapproved.

## 2024-01-02 NOTE — Telephone Encounter (Signed)
 PA request has been Approved. New Encounter has been or will be created for follow up. For additional info see Pharmacy Prior Auth telephone encounter from 01-02-2024.

## 2024-01-05 ENCOUNTER — Other Ambulatory Visit (HOSPITAL_COMMUNITY): Payer: Self-pay

## 2024-01-05 ENCOUNTER — Other Ambulatory Visit: Payer: Self-pay | Admitting: Obstetrics and Gynecology

## 2024-01-05 DIAGNOSIS — R928 Other abnormal and inconclusive findings on diagnostic imaging of breast: Secondary | ICD-10-CM

## 2024-01-06 ENCOUNTER — Other Ambulatory Visit (HOSPITAL_COMMUNITY): Payer: Self-pay

## 2024-01-06 MED ORDER — RIFAXIMIN 550 MG PO TABS
550.0000 mg | ORAL_TABLET | Freq: Three times a day (TID) | ORAL | 0 refills | Status: AC
  Filled 2024-01-06: qty 42, 14d supply, fill #0

## 2024-01-12 ENCOUNTER — Other Ambulatory Visit (HOSPITAL_COMMUNITY): Payer: Self-pay

## 2024-01-12 ENCOUNTER — Ambulatory Visit
Admission: RE | Admit: 2024-01-12 | Discharge: 2024-01-12 | Disposition: A | Discharge status: Home / Self Care | Source: Ambulatory Visit | Attending: Obstetrics and Gynecology

## 2024-01-12 DIAGNOSIS — R928 Other abnormal and inconclusive findings on diagnostic imaging of breast: Secondary | ICD-10-CM

## 2024-01-12 MED ORDER — INSULIN PEN NEEDLE 32G X 4 MM MISC
3 refills | Status: DC
Start: 2024-01-12 — End: 2024-08-16
  Filled 2024-01-12: qty 100, 90d supply, fill #0
  Filled 2024-04-06: qty 100, 90d supply, fill #1
  Filled 2024-06-11 – 2024-06-17 (×3): qty 100, 90d supply, fill #2
  Filled 2024-08-12 – 2024-08-13 (×2): qty 100, 90d supply, fill #3

## 2024-01-27 ENCOUNTER — Encounter: Payer: Self-pay | Admitting: Physician Assistant

## 2024-01-27 ENCOUNTER — Ambulatory Visit (INDEPENDENT_AMBULATORY_CARE_PROVIDER_SITE_OTHER): Admitting: Physician Assistant

## 2024-01-27 ENCOUNTER — Other Ambulatory Visit (INDEPENDENT_AMBULATORY_CARE_PROVIDER_SITE_OTHER)

## 2024-01-27 ENCOUNTER — Other Ambulatory Visit (HOSPITAL_COMMUNITY): Payer: Self-pay

## 2024-01-27 ENCOUNTER — Ambulatory Visit (INDEPENDENT_AMBULATORY_CARE_PROVIDER_SITE_OTHER)
Admission: RE | Admit: 2024-01-27 | Discharge: 2024-01-27 | Disposition: A | Discharge status: Home / Self Care | Source: Ambulatory Visit | Attending: Physician Assistant | Admitting: Physician Assistant

## 2024-01-27 VITALS — BP 122/80 | HR 66 | Ht 62.0 in | Wt 198.1 lb

## 2024-01-27 DIAGNOSIS — K219 Gastro-esophageal reflux disease without esophagitis: Secondary | ICD-10-CM

## 2024-01-27 DIAGNOSIS — R1084 Generalized abdominal pain: Secondary | ICD-10-CM | POA: Diagnosis not present

## 2024-01-27 DIAGNOSIS — K5900 Constipation, unspecified: Secondary | ICD-10-CM | POA: Diagnosis not present

## 2024-01-27 DIAGNOSIS — R197 Diarrhea, unspecified: Secondary | ICD-10-CM | POA: Diagnosis not present

## 2024-01-27 DIAGNOSIS — K580 Irritable bowel syndrome with diarrhea: Secondary | ICD-10-CM

## 2024-01-27 DIAGNOSIS — R140 Abdominal distension (gaseous): Secondary | ICD-10-CM

## 2024-01-27 LAB — CBC WITH DIFFERENTIAL/PLATELET
Basophils Absolute: 0.1 10*3/uL (ref 0.0–0.1)
Basophils Relative: 0.7 % (ref 0.0–3.0)
Eosinophils Absolute: 0.1 10*3/uL (ref 0.0–0.7)
Eosinophils Relative: 1.7 % (ref 0.0–5.0)
HCT: 39.7 % (ref 36.0–46.0)
Hemoglobin: 12.7 g/dL (ref 12.0–15.0)
Lymphocytes Relative: 42.8 % (ref 12.0–46.0)
Lymphs Abs: 3.3 10*3/uL (ref 0.7–4.0)
MCHC: 32 g/dL (ref 30.0–36.0)
MCV: 73.5 fl — ABNORMAL LOW (ref 78.0–100.0)
Monocytes Absolute: 0.5 10*3/uL (ref 0.1–1.0)
Monocytes Relative: 6.8 % (ref 3.0–12.0)
Neutro Abs: 3.6 10*3/uL (ref 1.4–7.7)
Neutrophils Relative %: 48 % (ref 43.0–77.0)
Platelets: 360 10*3/uL (ref 150.0–400.0)
RBC: 5.4 Mil/uL — ABNORMAL HIGH (ref 3.87–5.11)
RDW: 15.6 % — ABNORMAL HIGH (ref 11.5–15.5)
WBC: 7.6 10*3/uL (ref 4.0–10.5)

## 2024-01-27 LAB — SEDIMENTATION RATE: Sed Rate: 36 mm/h — ABNORMAL HIGH (ref 0–20)

## 2024-01-27 LAB — COMPREHENSIVE METABOLIC PANEL WITH GFR
ALT: 26 U/L (ref 0–35)
AST: 20 U/L (ref 0–37)
Albumin: 4.3 g/dL (ref 3.5–5.2)
Alkaline Phosphatase: 71 U/L (ref 39–117)
BUN: 11 mg/dL (ref 6–23)
CO2: 34 meq/L — ABNORMAL HIGH (ref 19–32)
Calcium: 9.4 mg/dL (ref 8.4–10.5)
Chloride: 98 meq/L (ref 96–112)
Creatinine, Ser: 1.11 mg/dL (ref 0.40–1.20)
GFR: 58.36 mL/min — ABNORMAL LOW (ref >60.00)
Glucose, Bld: 167 mg/dL — ABNORMAL HIGH (ref 70–99)
Potassium: 3.5 meq/L (ref 3.5–5.1)
Sodium: 137 meq/L (ref 135–145)
Total Bilirubin: 0.4 mg/dL (ref 0.2–1.2)
Total Protein: 7.3 g/dL (ref 6.0–8.3)

## 2024-01-27 LAB — HIGH SENSITIVITY CRP: CRP, High Sensitivity: 6.33 mg/L — ABNORMAL HIGH (ref 0.000–5.000)

## 2024-01-27 MED ORDER — NA SULFATE-K SULFATE-MG SULF 17.5-3.13-1.6 GM/177ML PO SOLN
1.0000 | Freq: Once | ORAL | 0 refills | Status: AC
  Filled 2024-01-27: qty 354, 1d supply, fill #0

## 2024-01-27 NOTE — Progress Notes (Addendum)
 01/27/2024 Carolyn Phillips [id number] September 26, 1976  Referring provider: Barnetta Liberty, MD Primary GI doctor: Dr. Yvone Herd  ASSESSMENT AND PLAN:   GERD, bloating, decreased appetite, fatigue, AB pain, Diarrhea Began Dec with UTI that presents with bloating and AB pain, has had multiple ABX since June, has been treated since Dec with bactrim , cephalaxin, and keflex , had cystoscopy that was unremarkable.  S/p norovirus 11/30/23 with CT that was unremarkable, negative Cdiff Celiac negative, sed rate elevated at 60 and CRP negative did trial of xifaxin without a difference Has been on mounjaro  for 1 years, was off since mid Feb, has restated mounjaro  for last 2 weeks She has severe AB cramping mid Jan with urgency, feels more loose stools than constipation -Suspect possible from mounjaro  may be contributing but trial off did not help  - very protuberant on exam but soft but she is passing gas more so with laying down, will get KUB stat rule out obstruction, multiple AB surgeries -will repeat sed rate/colon - continue PPI -EGD/Colon to evaluate nausea, vomiting, diarrhea at [facility], rule out microscopic colitis, Risk of bowel prep, conscious sedation, and EGD and colonoscopy were discussed.  Risks include but are not limited to dehydration, pain, bleeding, cardiopulmonary process, bowel perforation, or other possible adverse outcomes..  Treatment plan was discussed with patient, and agreed upon. -Consider GES but patient states in [place]  she had slow study - if KUB shows large stool burden and no obstruction, consider global gastroparesis and may consider motegrity - FODMAP given  GERD/nausea and vomiting Likely still from mounjaro  Negative celiac S/p cholecystectomy  Screening colonoscopy At [facility] 2023 with polyps Will get records and recall, states recall 5 years ADDENDUM: 01/18/2022 colonoscopy with endoscopy with Dr. Veronda Goody for rectal bleeding and abdominal  discomfort good bowel prep showed hemorrhoids, normal TI, 4 mm polyp transverse colon 6 mm polyp sigmoid colon normal mucosa biopsies were taken, internal hemorrhoids small Pathology showed tubular adenomatous polyps and colonic mucosa negative for lymphocytic and collagenous colitis or dysplasia Notes also state previous colonoscopy December 2021 small polyp sigmoid colon and internal hemorrhoids showed benign polypoid tissue repeat 5 years due to family history  Hepatic steatosis  seen on CT abdomen pelvis in the ER in March 2025 Check LFTs, avoid ETOH, has not had elevation Consider hepatocellular work up  I have reviewed the clinic note as outlined by Santina Cull, PA and agree with the assessment, plan and medical decision making.  Carolyn Phillips presents to the office for evaluation of GERD, nausea, vomiting, abdominal bloating and diarrhea.  Reports being on multiple antibiotics for UTI and also contracted a norovirus infection.  Has had more constipation since norovirus infection.  Noted to have protuberant abdomen in the office today.  KUB did not show bowel obstruction but did reveal moderate stool burden.  Agree that Mounjaro  could be contributing to some symptomatology-nausea, vomiting and potentially constipation.  Reasonable to pursue EGD and colonoscopy for further evaluation of symptoms.  Eugenia Hess, MD   Patient Care Team: Barnetta Liberty, MD as PCP - General (Internal Medicine) Tammie Fall, MD as PCP - Cardiology (Cardiology)  HISTORY OF PRESENT ILLNESS: 48 y.o. female with a past medical history of Carolyn Phillips, hypertension, OSA, type 2 diabetes, urinary incontinence, dyslipidemia and GERD and others listed below presents for evaluation of GERD, decreased appetite and bloating.  Status post cholecystectomy and hysterectomy and repair of urinary bladder.  Recent ER visit 11/30/2023 due to diarrhea and dehydration patient had  had multiple antibiotics for UTI C.  difficile negative lipase slightly increased likely from vomiting liver function unremarkable potassium and magnesium  are low GI pathogen positive for norovirus CT abdomen pelvis with contrast on 11/30/2023 showed fluid-filled stomach small bowel without evidence of obstruction or inflammation possibly affecting gastroenteritis no other abnormalities stable mild hepatic steatosis  History of cholecystectomy, hysterectomy, tubal ligation, bladder repair.   Discussed the use of AI scribe software for clinical note transcription with the patient, who gave verbal consent to proceed.  History of Present Illness   Carolyn Phillips is a 48 year old female who presents with severe bloating and gastrointestinal symptoms.  She has been experiencing severe bloating for the past few days, which is atypical for her. She has a history of multiple abdominal surgeries, including a hysterectomy, bladder repair, gallbladder removal, and tubal ligation. She alternates between constipation and diarrhea, with more frequent loose stools. Cramping occurs that 'doubles me over' and necessitates a bathroom visit within 10-15 minutes. She has been taking stool softeners to manage constipation, and her bowel movements have been irregular since contracting norovirus.  A recent emergency room visit revealed a positive norovirus test, a fluid-filled stomach, and mild fatty liver on CT scan. She has tried various medications for her gastrointestinal symptoms, including Xifaxan , which did not make a difference, and hyoscyamine , but continues to experience symptoms. She has a history of slow gastric emptying diagnosed in the early 2000s and reports a sensitive stomach since childhood. She manages her diet by eating bland foods and avoiding tomato-based and spicy foods to prevent exacerbation of her symptoms.  She resumed Mounjaro  for diabetes management two weeks ago after a break since mid-February. She reports high blood sugar  levels, particularly in the morning, which she attributes to the use of Ryaltris  nasal spray containing steroids. Morning vomiting is also noted, which she believes is related to her insulin  use and high blood sugar levels.  She has a history of lactose intolerance, which causes vomiting, diarrhea, and gas pain. She avoids lactose-containing foods and uses lactase supplements. Despite taking Protonix  at night and Pepcid  twice a day, she experiences reflux and heartburn. She also reports worsened allergies recently, being 'allergic to everything outside right now'.  She is in menopause and has had a recent mammogram, which required additional testing due to spots on the right breast, but was ultimately negative.      She  reports that she has never smoked. She has never used smokeless tobacco. She reports current alcohol  use. She reports that she does not use drugs.  RELEVANT GI HISTORY, IMAGING AND LABS: Results   LABS C diff: negative Norovirus: positive Sed rate: 60 (12/10/2023) CRP: negative (12/10/2023)  RADIOLOGY Abdominal CT: fluid-filled stomach, normal small bowel, no obstruction, mild hepatic steatosis (March 2025) Mammogram: negative      CBC    Component Value Date/Time   WBC 6.8 12/10/2023 1443   RBC 5.42 (H) 12/10/2023 1443   HGB 12.7 12/10/2023 1443   HCT 39.5 12/10/2023 1443   PLT 391.0 12/10/2023 1443   MCV 73.0 (L) 12/10/2023 1443   MCH 23.3 (L) 11/30/2023 1208   MCHC 32.2 12/10/2023 1443   RDW 15.8 (H) 12/10/2023 1443   LYMPHSABS 2.7 12/10/2023 1443   MONOABS 0.4 12/10/2023 1443   EOSABS 0.1 12/10/2023 1443   BASOSABS 0.1 12/10/2023 1443   Recent Labs    11/30/23 1208 12/10/23 1443  HGB 13.9 12.7    CMP     Component  Value Date/Time   NA 135 12/10/2023 1443   NA 139 03/01/2021 0816   K 3.4 (L) 12/10/2023 1443   CL 97 12/10/2023 1443   CO2 31 12/10/2023 1443   GLUCOSE 317 (H) 12/10/2023 1443   BUN 8 12/10/2023 1443   BUN 13 03/01/2021 0816    CREATININE 0.80 12/10/2023 1443   CALCIUM  9.6 12/10/2023 1443   PROT 7.2 12/10/2023 1443   ALBUMIN 4.2 12/10/2023 1443   AST 28 12/10/2023 1443   ALT 37 (H) 12/10/2023 1443   ALKPHOS 77 12/10/2023 1443   BILITOT 0.5 12/10/2023 1443   GFRNONAA >60 11/30/2023 1208   GFRAA >60 10/19/2017 0137      Latest Ref Rng & Units 12/10/2023    2:43 PM 11/30/2023   12:08 PM 01/01/2022    5:43 PM  Hepatic Function  Total Protein 6.0 - 8.3 g/dL 7.2  7.2  7.2   Albumin 3.5 - 5.2 g/dL 4.2  3.6  3.8   AST 0 - 37 U/L 28  22  17    ALT 0 - 35 U/L 37  21  20   Alk Phosphatase 39 - 117 U/L 77  62  64   Total Bilirubin 0.2 - 1.2 mg/dL 0.5  0.9  0.3       Current Medications:   Current Outpatient Medications (Endocrine & Metabolic):    estradiol  (DOTTI ) 0.05 MG/24HR patch, Place 1 patch (0.05 mg total) onto the skin 2 (two) times a week. (Patient taking differently: Place 1 patch onto the skin 2 (two) times a week. Sunday and Wednesday)   insulin  glargine, 2 Unit Dial , (TOUJEO  MAX SOLOSTAR) 300 UNIT/ML Solostar Pen, Inject 36 Units into the skin daily and increase as directed in clinic   insulin  glargine, 2 Unit Dial , (TOUJEO  MAX SOLOSTAR) 300 UNIT/ML Solostar Pen, Inject 36 Units (up to 70 units) into the skin daily. Increase as directed in clinic.   insulin  glargine, 2 Unit Dial , (TOUJEO  MAX SOLOSTAR) 300 UNIT/ML Solostar Pen, inject 46 units daily Subcutaneous (increase by 2 units every week if average 6am is >150 up to a max dose of 70 units)   tirzepatide  (MOUNJARO ) 2.5 MG/0.5ML Pen, Inject 2.5 mg into the skin once a week.  Current Outpatient Medications (Cardiovascular):    flecainide  (TAMBOCOR ) 50 MG tablet, Take 1 tablet (50 mg total) by mouth 2 (two) times daily.   hydrochlorothiazide  (HYDRODIURIL ) 25 MG tablet, Take 1 tablet (25 mg total) by mouth daily. FOR BLOOD PRESSURE/SWELLING   nebivolol  (BYSTOLIC ) 10 MG tablet, Take 1 tablet (10 mg total) by mouth at bedtime.   rosuvastatin  (CRESTOR ) 5 MG  tablet, Take 1 tablet (5 mg total) by mouth daily.  Current Outpatient Medications (Respiratory):    albuterol  (VENTOLIN  HFA) 108 (90 Base) MCG/ACT inhaler, Inhale 2 puffs into the lungs every 4 to 6 hours as needed for shortness of breath or wheezing.   fluticasone  (FLONASE ) 50 MCG/ACT nasal spray, Place 2 sprays into both nostrils daily. (Patient taking differently: Place 2 sprays into both nostrils daily as needed for allergies.)   levocetirizine (XYZAL ) 5 MG tablet, TAKE 1 TABLET BY MOUTH AT BEDTIME   montelukast  (SINGULAIR ) 10 MG tablet, Take 1 tablet (10 mg total) by mouth daily.   Olopatadine-Mometasone (RYALTRIS ) 665-25 MCG/ACT SUSP, Place 2 sprays into the nose 2 (two) times daily as needed.  Current Outpatient Medications (Analgesics):    acetaminophen  (TYLENOL ) 500 MG tablet, Take 1,000 mg by mouth every 6 (six) hours as needed for  mild pain (pain score 1-3).   ibuprofen  (ADVIL ,MOTRIN ) 800 MG tablet, Take 1 tablet (800 mg total) by mouth 3 (three) times daily.   oxyCODONE  (OXY IR/ROXICODONE ) 5 MG immediate release tablet, Take 1 tablet (5 mg total) by mouth every 6 (six) hours as needed for pain   Current Outpatient Medications (Other):    ALPRAZolam  (XANAX ) 0.25 MG tablet, Take 1 tablet (0.25 mg total) by mouth 2 (two) times daily as needed for anxiety.   cephALEXin  (KEFLEX ) 250 MG capsule, Take 1 capsule (250 mg total) by mouth at bedtime.   Cholecalciferol (VITAMIN D3) 125 MCG (5000 UT) CAPS, Take 5,000 Units by mouth daily.    Continuous Glucose Sensor (FREESTYLE LIBRE 3 PLUS SENSOR) MISC, Apply one sensor every 15 days to continuously monitor blood glucose.   Continuous Glucose Sensor (FREESTYLE LIBRE 3 SENSOR) MISC, Change sensor topically every 14 (fourteen) days to monitor blood glucose continuously   Eflornithine HCl (VANIQA) 13.9 % cream, Apply 1 application topically 2 (two) times daily as needed (acne).   estradiol  (ESTRACE ) 0.1 MG/GM vaginal cream, Apply 3 times weekly  as directed using a pea-sized amount on fingertip. (Patient taking differently: Apply 3 times weekly as directed using a pea-sized amount on fingertip. Monday Tuesday and Friday)   famotidine  (PEPCID ) 20 MG tablet, Take 1 tablet (20 mg total) by mouth 2 (two) times daily as needed for reflux.   glucose blood (FREESTYLE LITE) test strip, Use to check blood sugar 1 time per day. Dx Code: E11.9   hydrocortisone  (ANUSOL -HC) 25 MG suppository, Place 1 suppository (25 mg total) rectally at bedtime for 14 days.   hyoscyamine  (LEVSIN) 0.125 MG tablet, Take 1 tablet (0.125 mg total) by mouth every 6 (six) hours as needed for cramping.   Hyoscyamine  Sulfate SL 0.125 MG SUBL, Place 1 tablet ( 0.125 mg) under the tongue and allow to dissolve 2 (two) times daily as needed.   Insulin  Pen Needle 32G X 4 MM MISC, as directed with Soliqua  daily 90 days   Lancets (FREESTYLE) lancets, Use to check blood sugar 1 time per day. Dx Code: E11.9   meclizine  (ANTIVERT ) 25 MG tablet, TAKE 1 TABLET ( 25MG ) BY MOUTH THREE TIME DAILY AS NEEDED FOR DIZZINESS   Multiple Vitamins-Minerals (ZINC PO), Take 1 tablet by mouth daily.   ondansetron  (ZOFRAN -ODT) 4 MG disintegrating tablet, Take 1 tablet (4 mg total) by mouth every 8 (eight) hours as needed for nausea for up to 7 days   pantoprazole  (PROTONIX ) 20 MG tablet, Take 1 tablet (20 mg total) by mouth daily before breakfast.   Sodium Sulfate-Mag Sulfate-KCl (SUTAB ) 1479-225-188 MG TABS, Use as directed   Spacer/Aero-Holding Chambers (AEROCHAMBER PLUS) inhaler, Use as instructed   Continuous Glucose Sensor (FREESTYLE LIBRE 3 SENSOR) MISC, Change sensor every 14 days to monitor blood glucose continuously  Medical History:  Past Medical History:  Diagnosis Date   Angio-edema    Asthma    Beta thalassemia trait    Complication of anesthesia    Diabetes mellitus without complication (HCC)    Dysrhythmia    WPW-ablation   Eczema    GERD (gastroesophageal reflux disease)     Headache    Hyperlipemia    Hypertension    Obesity    PONV (postoperative nausea and vomiting)    Recurrent upper respiratory infection (URI)    Urticaria    Wolff-Parkinson-White syndrome    Allergies:  Allergies  Allergen Reactions   Penicillins Hives and Swelling  Has patient had a PCN reaction causing immediate rash, facial/tongue/throat swelling, SOB or lightheadedness with hypotension: Yes Has patient had a PCN reaction causing severe rash involving mucus membranes or skin necrosis: No Has patient had a PCN reaction that required hospitalization No Has patient had a PCN reaction occurring within the last 10 years: No If all of the above answers are NO, then may proceed with Cephalosporin use.   Eye swelling   Metoprolol     LOL Drugs cause hair to fall out   Nitrofurantoin Macrocrystal Other (See Comments)    Pt unsure of reaction    Other Nausea And Vomiting    General Anesthesia      Surgical History:  She  has a past surgical history that includes Abdominal hysterectomy; Bladder repair; Breast reduction surgery; Tubal ligation; supraventricular tachycardia ablation (N/A, 07/04/2014); Knee arthroscopy with drilling/microfracture (Left, 09/13/2015); Chondroplasty (Left, 09/13/2015); Reduction mammaplasty (Bilateral, 2001); and Cholecystectomy (N/A, 10/25/2021). Family History:  Her family history includes Allergic rhinitis in her brother, maternal aunt, maternal uncle, mother, paternal aunt, paternal uncle, and son; Angioedema in her maternal aunt, maternal aunt, maternal aunt, mother, paternal uncle, and sister; Asthma in her brother, maternal aunt, maternal aunt, mother, and son; Breast cancer in her paternal aunt and sister; Breast cancer (age of onset: 23) in her cousin; Diabetes in her brother, mother, and sister; Eczema in her brother, mother, and son; Heart attack in her maternal grandmother; Stroke in her maternal grandmother; Urticaria in her son; Valvular heart  disease in her mother.  REVIEW OF SYSTEMS  : All other systems reviewed and negative except where noted in the History of Present Illness.  PHYSICAL EXAM: Ht 5' 2 (1.575 m)   Wt 198 lb 2 oz (89.9 kg)   BMI 36.24 kg/m  Physical Exam   GENERAL APPEARANCE: Well nourished, in no apparent distress. HEENT: No cervical lymphadenopathy, unremarkable thyroid , sclerae anicteric, conjunctiva pink. RESPIRATORY: Respiratory effort normal, BS equal bilateral without rales, rhonchi, wheezing. CARDIO: RRR with no MRGs, peripheral pulses intact. ABDOMEN: Soft, protuberant, active bowel sounds in all 4 quadrants, mild tenderness in left lower quadrant, no rebound, no mass appreciated. RECTAL: Declines. MUSCULOSKELETAL: Full ROM, normal gait, without edema. SKIN: Dry, intact without rashes or lesions. No jaundice. NEURO: Alert, oriented, no focal deficits. PSYCH: Cooperative, normal mood and affect.      Edmonia Gottron, PA-C 3:34 PM

## 2024-01-27 NOTE — Patient Instructions (Addendum)
 _______________________________________________________  If your blood pressure at your visit was 140/90 or greater, please contact your primary care physician to follow up on this.  _______________________________________________________  If you are age 50 or older, your body mass index should be between 23-30. Your Body mass index is 36.24 kg/m. If this is out of the aforementioned range listed, please consider follow up with your Primary Care Provider.  If you are age 83 or younger, your body mass index should be between 19-25. Your Body mass index is 36.24 kg/m. If this is out of the aformentioned range listed, please consider follow up with your Primary Care Provider.   ________________________________________________________  The [facility] providers would like to encourage you to use MYCHART to communicate with providers for non-urgent requests or questions.  Due to long hold times on the telephone, sending your provider a message by [facility] may be a faster and more efficient way to get a response.  Please allow 48 business hours for a response.  Please remember that this is for non-urgent requests.  _______________________________________________________  Your provider has requested that you go to the basement level for lab work before leaving today. Press "B" on the elevator. The lab is located at the first door on the left as you exit the elevator.  Your provider has requested that you have an abdominal x ray before leaving today. Please go to the basement floor to our Radiology department for the test.  You have been scheduled for an endoscopy and colonoscopy. Please follow the written instructions given to you at your visit today.  If you use inhalers (even only as needed), please bring them with you on the day of your procedure.  DO NOT TAKE 7 DAYS PRIOR TO TEST- Trulicity (dulaglutide) Ozempic , Wegovy  (semaglutide ) Mounjaro  (tirzepatide ) Bydureon Bcise (exanatide extended  release)  DO NOT TAKE 1 DAY PRIOR TO YOUR TEST Rybelsus  (semaglutide ) Adlyxin  (lixisenatide ) Victoza (liraglutide) Byetta (exanatide) ___________________________________________________________________________   Carolyn Phillips may have POST INFECTIOUS IBS OR IRRITABLE BOWEL After an infection or diverticulitis flare your intestines can spasm or be a little bit more sensitive. Try these things below:   low FODMAP- see below Try trial off milk/lactose products.  Can do trial of IBGard for AB pain EVERY DAY- Take 1-2 capsules once a day for maintence or twice a day during a flare Can take levsin as needed.  if any worsening symptoms like blood in stool, weight loss, please call the office or go to the ER.    FODMAP stands for fermentable oligo-, di-, mono-saccharides and polyols (1). These are the scientific terms used to classify groups of carbs that are notorious for triggering digestive symptoms like bloating, gas and stomach pain.

## 2024-01-30 ENCOUNTER — Other Ambulatory Visit (HOSPITAL_COMMUNITY): Payer: Self-pay

## 2024-01-30 ENCOUNTER — Other Ambulatory Visit: Payer: Self-pay

## 2024-01-30 MED ORDER — ALPRAZOLAM 0.25 MG PO TABS
0.2500 mg | ORAL_TABLET | Freq: Two times a day (BID) | ORAL | 0 refills | Status: DC | PRN
  Filled 2024-01-30: qty 60, 30d supply, fill #0

## 2024-01-30 MED ORDER — MOUNJARO 2.5 MG/0.5ML ~~LOC~~ SOAJ
2.5000 mg | SUBCUTANEOUS | 5 refills | Status: AC
Start: 2024-01-30 — End: ?
  Filled 2024-01-30: qty 2, 28d supply, fill #0
  Filled 2024-03-03: qty 2, 28d supply, fill #1
  Filled 2024-03-23 – 2024-03-29 (×2): qty 2, 28d supply, fill #2
  Filled 2024-04-28: qty 2, 28d supply, fill #3
  Filled 2024-06-11: qty 2, 28d supply, fill #4
  Filled 2024-08-12: qty 2, 28d supply, fill #5

## 2024-02-03 ENCOUNTER — Other Ambulatory Visit (HOSPITAL_COMMUNITY): Payer: Self-pay

## 2024-02-03 ENCOUNTER — Other Ambulatory Visit: Payer: Self-pay

## 2024-02-03 MED ORDER — FAMOTIDINE 20 MG PO TABS
20.0000 mg | ORAL_TABLET | Freq: Two times a day (BID) | ORAL | 0 refills | Status: DC
  Filled 2024-02-03: qty 60, 30d supply, fill #0

## 2024-02-12 ENCOUNTER — Encounter: Payer: Self-pay | Admitting: Pediatrics

## 2024-02-18 ENCOUNTER — Ambulatory Visit: Payer: 59 | Admitting: Urology

## 2024-02-19 ENCOUNTER — Encounter: Payer: Self-pay | Admitting: Urology

## 2024-02-19 ENCOUNTER — Ambulatory Visit (INDEPENDENT_AMBULATORY_CARE_PROVIDER_SITE_OTHER): Admitting: Urology

## 2024-02-19 VITALS — BP 134/88 | HR 78 | Ht 62.0 in | Wt 181.0 lb

## 2024-02-19 DIAGNOSIS — N390 Urinary tract infection, site not specified: Secondary | ICD-10-CM

## 2024-02-19 DIAGNOSIS — N3281 Overactive bladder: Secondary | ICD-10-CM | POA: Diagnosis not present

## 2024-02-19 DIAGNOSIS — Z87440 Personal history of urinary (tract) infections: Secondary | ICD-10-CM

## 2024-02-19 DIAGNOSIS — R8290 Unspecified abnormal findings in urine: Secondary | ICD-10-CM

## 2024-02-19 LAB — URINALYSIS, ROUTINE W REFLEX MICROSCOPIC
Bilirubin, UA: NEGATIVE
Ketones, UA: NEGATIVE
Leukocytes,UA: NEGATIVE
Nitrite, UA: NEGATIVE
Protein,UA: NEGATIVE
RBC, UA: NEGATIVE
Specific Gravity, UA: 1.02 (ref 1.005–1.030)
Urobilinogen, Ur: 0.2 mg/dL (ref 0.2–1.0)
pH, UA: 5.5 (ref 5.0–7.5)

## 2024-02-19 LAB — MICROSCOPIC EXAMINATION: Epithelial Cells (non renal): >10 /HPF — AB (ref 0–10)

## 2024-02-19 MED ORDER — GEMTESA 75 MG PO TABS
75.0000 mg | ORAL_TABLET | Freq: Every day | ORAL | Status: DC
Start: 2024-02-19 — End: 2024-04-06

## 2024-02-19 NOTE — Progress Notes (Signed)
 Assessment: 1. Recurrent UTI   2. OAB (overactive bladder)   3. Abnormal urine findings     Plan: Continue methods to reduce the risk of UTIs including increase fluid intake, timed and double voiding, daily cranberry supplementation, daily probiotic, vaginal estrogen cream, and antibiotic suppression with Keflex . Urine culture sent today. Continue daily antibiotic. Trial of Gemtesa 75 mg daily.  Samples given. Advised patient to call with results of Gemtesa if she would like a prescription. Return to office in 3 months.  Chief Complaint: Chief Complaint  Patient presents with   Recurrent UTI    HPI: Carolyn Phillips is a 48 y.o. female who presents for continued evaluation of recurrent UTIs and overactive bladder. She has been previously evaluated by Dr. Del Favia and was last seen in February 2025.  History: Past medical history is remarkable for WPW ablation 2015, hypertension, hyperlipidemia, diabetes. Prior GU history of remarkable for abdominal hysterectomy and bladder repair in 2010.  She underwent an open Burch procedure for genuine stress incontinence in [place]  in 2011. Patient with history of multiple intermittent episodes compatible with simple cystitis  treated with multiple courses of antibiotics over the last 6 months.  These episodes are without culture documentation.  She also reported frequency, urgency, and occasional urge incontinence.  Her urinary symptoms have been progressing over the last few years. She presented 09/13/2023 with flank discomfort and significant dysuria and frequency.  She was noted to have significant microscopic hematuria and pyuria.  Urine culture grew mixed flora.  Patient was treated empirically with Omnicef  300 twice daily for 7 days.   CT A/P 09/26/2023--no acute findings. Adrenals/Urinary Tract: Adrenal glands are within normal limits. Kidneys demonstrate a normal enhancement pattern bilaterally. No renal calculi or obstructive  changes are seen. Normal excretion is noted on delayed images. Bladder is decompressed. Reproductive: Status post hysterectomy. No adnexal masses.  Urine culture from 2/25 grew >100 K Klebsiella.  She was previously on daily suppression with low-dose Bactrim . She was changed to Keflex  2 and 50 mg daily in February 2025. Cystoscopy from 2/25 showed no urethral or bladder abnormalities. She was on Vesicare  for her OAB symptoms.  This was discontinued in February 2025 due to some difficulty voiding.  She returns today for follow-up.  She continues on daily cephalexin  for UTI prevention.  She is also using vaginal hormone cream.  She is not having any UTI symptoms at the present time.  No dysuria or gross hematuria.  She continues to have symptoms of frequency, urgency, and urge incontinence.  Portions of the above documentation were copied from a prior visit for review purposes only.  Allergies: Allergies  Allergen Reactions   Penicillins Hives and Swelling    Has patient had a PCN reaction causing immediate rash, facial/tongue/throat swelling, SOB or lightheadedness with hypotension: Yes Has patient had a PCN reaction causing severe rash involving mucus membranes or skin necrosis: No Has patient had a PCN reaction that required hospitalization No Has patient had a PCN reaction occurring within the last 10 years: No If all of the above answers are "NO", then may proceed with Cephalosporin use.   Eye swelling   Metoprolol     "LOL" Drugs cause hair to fall out   Nitrofurantoin Macrocrystal Other (See Comments)    Pt unsure of reaction    Other Nausea And Vomiting    General Anesthesia     PMH: Past Medical History:  Diagnosis Date   Angio-edema    Asthma  Beta thalassemia trait    Complication of anesthesia    Diabetes mellitus without complication (HCC)    Dysrhythmia    WPW-ablation   Eczema    GERD (gastroesophageal reflux disease)    Headache    Hyperlipemia     Hypertension    Obesity    PONV (postoperative nausea and vomiting)    Recurrent upper respiratory infection (URI)    Urticaria    Wolff-Parkinson-White syndrome     PSH: Past Surgical History:  Procedure Laterality Date   ABDOMINAL HYSTERECTOMY     BLADDER REPAIR     BREAST REDUCTION SURGERY     CHOLECYSTECTOMY N/A 10/25/2021   Procedure: LAPAROSCOPIC CHOLECYSTECTOMY WITH INTRAOPERATIVE CHOLANGIOGRAM;  Surgeon: Dareen Ebbing, MD;  Location: [facility];  Service: General;  Laterality: N/A;   CHONDROPLASTY Left 09/13/2015   Procedure: CHONDROPLASTY;  Surgeon: Wendolyn Hamburger, MD;  Location: [facility];  Service: Orthopedics;  Laterality: Left;   KNEE ARTHROSCOPY WITH DRILLING/MICROFRACTURE Left 09/13/2015   Procedure: KNEE ARTHROSCOPY WITH DRILLING/MICROFRACTURE;  Surgeon: Wendolyn Hamburger, MD;  Location: [facility];  Service: Orthopedics;  Laterality: Left;   REDUCTION MAMMAPLASTY Bilateral 2001   SUPRAVENTRICULAR TACHYCARDIA ABLATION N/A 07/04/2014   Procedure: WPW ABLATION;  Surgeon: Tammie Fall, MD;  Location: [facility];  Service: Cardiovascular;  Laterality: N/A;   TUBAL LIGATION      SH: Social History   Tobacco Use   Smoking status: Never   Smokeless tobacco: Never   Tobacco comments:    "only smoked for one year al ong time ago "  Vaping Use   Vaping status: Never Used  Substance Use Topics   Alcohol  use: Yes    Alcohol /week: 0.0 standard drinks of alcohol     Comment: occasional   Drug use: No    ROS: Constitutional:  Negative for fever, chills, weight loss CV: Negative for chest pain, previous MI, hypertension Respiratory:  Negative for shortness of breath, wheezing, sleep apnea, frequent cough GI:  Negative for nausea, vomiting, bloody stool, GERD  PE: BP 134/88   Pulse 78   Ht 5\' 2"  (1.575 m)   Wt 181 lb (82.1 kg)   BMI 33.11 kg/m  GENERAL APPEARANCE:  Well appearing, well developed, well nourished, NAD HEENT:  Atraumatic,  normocephalic, oropharynx clear NECK:  Supple without lymphadenopathy or thyromegaly ABDOMEN:  Soft, non-tender, no masses EXTREMITIES:  Moves all extremities well, without clubbing, cyanosis, or edema NEUROLOGIC:  Alert and oriented x 3, normal gait, CN II-XII grossly intact MENTAL STATUS:  appropriate BACK:  Non-tender to palpation, No CVAT SKIN:  Warm, dry, and intact   Results: U/A: 0-5 WBCs, 0-2 RBCs, >10 epithelial cells, many bacteria

## 2024-02-23 NOTE — Progress Notes (Unsigned)
 [facility] History and Physical   Primary Care Physician:  Barnetta Liberty, MD   Reason for Procedure:  GERD, bloating, loss of appetite, abdominal pain and diarrhea, history of colon polyps  Plan:    Upper endoscopy and colonoscopy     HPI: Carolyn Phillips is a 48 y.o. female undergoing upper endoscopy and colonoscopy for investigation of GERD, bloating, loss of appetite, abdominal pain and diarrhea.  Patient reports being on multiple antibiotics due to history of UTI with subsequent development of the symptoms noted above.  C. difficile was negative.  Lipase slightly elevated like related to vomiting.  GI pathogen panel was positive for norovirus.  CTAP showed fluid-filled stomach and small bowel without evidence of obstruction or inflammation.  Reports ongoing symptoms of severe bloating and loss of appetite.  Experiencing alternating diarrhea and constipation as well as abdominal cramping.   Last EGD performed at [facility] in 2023 with report of colon polyps and a 5-year recall-awaiting those results.  No documented family history of colon cancer or polyps.  Past Medical History:  Diagnosis Date   Angio-edema    Asthma    Beta thalassemia trait    Complication of anesthesia    Diabetes mellitus without complication (HCC)    Dysrhythmia    WPW-ablation   Eczema    GERD (gastroesophageal reflux disease)    Headache    Hyperlipemia    Hypertension    Obesity    PONV (postoperative nausea and vomiting)    Recurrent upper respiratory infection (URI)    Urticaria    Wolff-Parkinson-White syndrome     Past Surgical History:  Procedure Laterality Date   ABDOMINAL HYSTERECTOMY     BLADDER REPAIR     BREAST REDUCTION SURGERY     CHOLECYSTECTOMY N/A 10/25/2021   Procedure: LAPAROSCOPIC CHOLECYSTECTOMY WITH INTRAOPERATIVE CHOLANGIOGRAM;  Surgeon: Dareen Ebbing, MD;  Location: [facility];  Service: General;  Laterality: N/A;   CHONDROPLASTY Left  09/13/2015   Procedure: CHONDROPLASTY;  Surgeon: Wendolyn Hamburger, MD;  Location: [facility];  Service: Orthopedics;  Laterality: Left;   KNEE ARTHROSCOPY WITH DRILLING/MICROFRACTURE Left 09/13/2015   Procedure: KNEE ARTHROSCOPY WITH DRILLING/MICROFRACTURE;  Surgeon: Wendolyn Hamburger, MD;  Location: [facility];  Service: Orthopedics;  Laterality: Left;   REDUCTION MAMMAPLASTY Bilateral 2001   SUPRAVENTRICULAR TACHYCARDIA ABLATION N/A 07/04/2014   Procedure: WPW ABLATION;  Surgeon: Tammie Fall, MD;  Location: [facility] CATH LAB;  Service: Cardiovascular;  Laterality: N/A;   TUBAL LIGATION      Prior to Admission medications   Medication Sig Start Date End Date Taking? Authorizing Provider  acetaminophen  (TYLENOL ) 500 MG tablet Take 1,000 mg by mouth every 6 (six) hours as needed for mild pain (pain score 1-3).    [provider]  albuterol  (VENTOLIN  HFA) 108 (90 Base) MCG/ACT inhaler Inhale 2 puffs into the lungs every 4 to 6 hours as needed for shortness of breath or wheezing. 12/19/23   Brian Campanile, MD  ALPRAZolam  (XANAX ) 0.25 MG tablet Take 1 tablet (0.25 mg total) by mouth 2 (two) times daily as needed. 01/30/24     cephALEXin  (KEFLEX ) 250 MG capsule Take 1 capsule (250 mg total) by mouth at bedtime. 11/18/23   Scarlet Curly, MD  Cholecalciferol (VITAMIN D3) 125 MCG (5000 UT) CAPS Take 5,000 Units by mouth daily.     [provider]  Continuous Glucose Sensor (FREESTYLE LIBRE 3 PLUS SENSOR) MISC Apply one sensor every 15 days to continuously  monitor blood glucose. 07/14/23   Barnetta Liberty, MD  Continuous Glucose Sensor (FREESTYLE LIBRE 3 SENSOR) MISC Change sensor topically every 14 (fourteen) days to monitor blood glucose continuously 01/06/23   Barnetta Liberty, MD  Eflornithine HCl (VANIQA) 13.9 % cream Apply 1 application topically 2 (two) times daily as needed (acne).    [provider]  estradiol  (DOTTI ) 0.05 MG/24HR patch Place 1  patch (0.05 mg total) onto the skin 2 (two) times a week. Patient taking differently: Place 1 patch onto the skin 2 (two) times a week. Sunday and Wednesday 01/01/24     estradiol  (ESTRACE ) 0.1 MG/GM vaginal cream Apply 3 times weekly as directed using a pea-sized amount on fingertip. Patient taking differently: Apply 3 times weekly as directed using a pea-sized amount on fingertip. Monday Tuesday and Friday 11/18/23   Scarlet Curly, MD  famotidine  (PEPCID ) 20 MG tablet Take 1 tablet (20 mg total) by mouth 2 (two) times daily. 02/03/24     flecainide  (TAMBOCOR ) 50 MG tablet Take 1 tablet (50 mg total) by mouth 2 (two) times daily. 12/22/23   Tammie Fall, MD  fluticasone  (FLONASE ) 50 MCG/ACT nasal spray Place 2 sprays into both nostrils daily. Patient taking differently: Place 2 sprays into both nostrils daily as needed for allergies. 08/03/18   Johnnye Naveya Ellerman, NP  glucose blood (FREESTYLE LITE) test strip Use to check blood sugar 1 time per day. Dx Code: E11.9 11/01/16   Gwyndolyn Lerner, MD  hydrochlorothiazide  (HYDRODIURIL ) 25 MG tablet Take 1 tablet (25 mg total) by mouth daily. FOR BLOOD PRESSURE/SWELLING 12/22/23     hydrocortisone  (ANUSOL -HC) 25 MG suppository Place 1 suppository (25 mg total) rectally at bedtime for 14 days. 01/15/22     hyoscyamine  (LEVSIN) 0.125 MG tablet Take 1 tablet (0.125 mg total) by mouth every 6 (six) hours as needed for cramping. 12/10/23   Edmonia Gottron, PA-C  Hyoscyamine  Sulfate SL 0.125 MG SUBL Place 1 tablet ( 0.125 mg) under the tongue and allow to dissolve 2 (two) times daily as needed. 01/18/22     ibuprofen  (ADVIL ,MOTRIN ) 800 MG tablet Take 1 tablet (800 mg total) by mouth 3 (three) times daily. 11/15/18   Stephany Ehrich, MD  insulin  glargine, 2 Unit Dial , (TOUJEO  MAX SOLOSTAR) 300 UNIT/ML Solostar Pen Inject 36 Units into the skin daily and increase as directed in clinic 06/05/23     insulin  glargine, 2 Unit Dial , (TOUJEO  MAX SOLOSTAR) 300 UNIT/ML Solostar Pen  Inject 36 Units (up to 70 units) into the skin daily. Increase as directed in clinic. 07/15/23     insulin  glargine, 2 Unit Dial , (TOUJEO  MAX SOLOSTAR) 300 UNIT/ML Solostar Pen inject 46 units daily Subcutaneous (increase by 2 units every week if average 6am is >150 up to a max dose of 70 units) 12/10/23     Insulin  Pen Needle 32G X 4 MM MISC as directed with Soliqua  daily 90 days 01/12/24     Lancets (FREESTYLE) lancets Use to check blood sugar 1 time per day. Dx Code: E11.9 11/01/16   Gwyndolyn Lerner, MD  levocetirizine (XYZAL ) 5 MG tablet TAKE 1 TABLET BY MOUTH AT BEDTIME 10/19/23     meclizine  (ANTIVERT ) 25 MG tablet TAKE 1 TABLET ( 25MG ) BY MOUTH THREE TIME DAILY AS NEEDED FOR DIZZINESS    [provider]  montelukast  (SINGULAIR ) 10 MG tablet Take 1 tablet (10 mg total) by mouth daily. 06/30/23     Multiple Vitamins-Minerals (ZINC PO) Take 1 tablet by mouth  daily.    [provider]  nebivolol  (BYSTOLIC ) 10 MG tablet Take 1 tablet (10 mg total) by mouth at bedtime. 04/07/23     ondansetron  (ZOFRAN -ODT) 4 MG disintegrating tablet Take 1 tablet (4 mg total) by mouth every 8 (eight) hours as needed for nausea for up to 7 days 11/30/23   Ninetta Basket, MD  oxyCODONE  (OXY IR/ROXICODONE ) 5 MG immediate release tablet Take 1 tablet (5 mg total) by mouth every 6 (six) hours as needed for pain 10/19/21     pantoprazole  (PROTONIX ) 20 MG tablet Take 1 tablet (20 mg total) by mouth daily before breakfast. 12/10/23   Edmonia Gottron, PA-C  rosuvastatin  (CRESTOR ) 5 MG tablet Take 1 tablet (5 mg total) by mouth daily. 11/20/22     Spacer/Aero-Holding Chambers (AEROCHAMBER PLUS) inhaler Use as instructed 10/17/17   Ethlyn Herd, MD  tirzepatide  (MOUNJARO ) 2.5 MG/0.5ML Pen Inject 2.5 mg into the skin once a week. 01/30/24     Vibegron (GEMTESA) 75 MG TABS Take 1 tablet (75 mg total) by mouth daily. 02/19/24   Stoneking, Ponce Brisker., MD    Current Outpatient Medications  Medication Sig Dispense  Refill   acetaminophen  (TYLENOL ) 500 MG tablet Take 1,000 mg by mouth every 6 (six) hours as needed for mild pain (pain score 1-3).     albuterol  (VENTOLIN  HFA) 108 (90 Base) MCG/ACT inhaler Inhale 2 puffs into the lungs every 4 to 6 hours as needed for shortness of breath or wheezing. 6.7 g 1   ALPRAZolam  (XANAX ) 0.25 MG tablet Take 1 tablet (0.25 mg total) by mouth 2 (two) times daily as needed. 60 tablet 0   cephALEXin  (KEFLEX ) 250 MG capsule Take 1 capsule (250 mg total) by mouth at bedtime. 30 capsule 30   Cholecalciferol (VITAMIN D3) 125 MCG (5000 UT) CAPS Take 5,000 Units by mouth daily.      Continuous Glucose Sensor (FREESTYLE LIBRE 3 PLUS SENSOR) MISC Apply one sensor every 15 days to continuously monitor blood glucose. 6 each 3   Continuous Glucose Sensor (FREESTYLE LIBRE 3 SENSOR) MISC Change sensor topically every 14 (fourteen) days to monitor blood glucose continuously 2 each 5   Eflornithine HCl (VANIQA) 13.9 % cream Apply 1 application topically 2 (two) times daily as needed (acne).     estradiol  (DOTTI ) 0.05 MG/24HR patch Place 1 patch (0.05 mg total) onto the skin 2 (two) times a week. (Patient taking differently: Place 1 patch onto the skin 2 (two) times a week. Sunday and Wednesday) 24 patch 4   estradiol  (ESTRACE ) 0.1 MG/GM vaginal cream Apply 3 times weekly as directed using a pea-sized amount on fingertip. (Patient taking differently: Apply 3 times weekly as directed using a pea-sized amount on fingertip. Monday Tuesday and Friday) 42.5 g 12   famotidine  (PEPCID ) 20 MG tablet Take 1 tablet (20 mg total) by mouth 2 (two) times daily. 60 tablet 0   flecainide  (TAMBOCOR ) 50 MG tablet Take 1 tablet (50 mg total) by mouth 2 (two) times daily. 180 tablet 0   fluticasone  (FLONASE ) 50 MCG/ACT nasal spray Place 2 sprays into both nostrils daily. (Patient taking differently: Place 2 sprays into both nostrils daily as needed for allergies.) 16 g 0   glucose blood (FREESTYLE LITE) test strip  Use to check blood sugar 1 time per day. Dx Code: E11.9 100 each 2   hydrochlorothiazide  (HYDRODIURIL ) 25 MG tablet Take 1 tablet (25 mg total) by mouth daily. FOR BLOOD PRESSURE/SWELLING 90 tablet 3  hydrocortisone  (ANUSOL -HC) 25 MG suppository Place 1 suppository (25 mg total) rectally at bedtime for 14 days. 14 suppository 1   hyoscyamine  (LEVSIN) 0.125 MG tablet Take 1 tablet (0.125 mg total) by mouth every 6 (six) hours as needed for cramping. 30 tablet 0   Hyoscyamine  Sulfate SL 0.125 MG SUBL Place 1 tablet ( 0.125 mg) under the tongue and allow to dissolve 2 (two) times daily as needed. 60 tablet 1   ibuprofen  (ADVIL ,MOTRIN ) 800 MG tablet Take 1 tablet (800 mg total) by mouth 3 (three) times daily. 21 tablet 0   insulin  glargine, 2 Unit Dial , (TOUJEO  MAX SOLOSTAR) 300 UNIT/ML Solostar Pen Inject 36 Units into the skin daily and increase as directed in clinic 9 mL 5   insulin  glargine, 2 Unit Dial , (TOUJEO  MAX SOLOSTAR) 300 UNIT/ML Solostar Pen Inject 36 Units (up to 70 units) into the skin daily. Increase as directed in clinic. 9 mL 3   insulin  glargine, 2 Unit Dial , (TOUJEO  MAX SOLOSTAR) 300 UNIT/ML Solostar Pen inject 46 units daily Subcutaneous (increase by 2 units every week if average 6am is >150 up to a max dose of 70 units) 6 mL 11   Insulin  Pen Needle 32G X 4 MM MISC as directed with Soliqua  daily 90 days 100 each 3   Lancets (FREESTYLE) lancets Use to check blood sugar 1 time per day. Dx Code: E11.9 100 each 2   levocetirizine (XYZAL ) 5 MG tablet TAKE 1 TABLET BY MOUTH AT BEDTIME 90 tablet 4   meclizine  (ANTIVERT ) 25 MG tablet TAKE 1 TABLET ( 25MG ) BY MOUTH THREE TIME DAILY AS NEEDED FOR DIZZINESS     montelukast  (SINGULAIR ) 10 MG tablet Take 1 tablet (10 mg total) by mouth daily. 90 tablet 2   Multiple Vitamins-Minerals (ZINC PO) Take 1 tablet by mouth daily.     nebivolol  (BYSTOLIC ) 10 MG tablet Take 1 tablet (10 mg total) by mouth at bedtime. 90 tablet 2   ondansetron  (ZOFRAN -ODT)  4 MG disintegrating tablet Take 1 tablet (4 mg total) by mouth every 8 (eight) hours as needed for nausea for up to 7 days 15 tablet 0   oxyCODONE  (OXY IR/ROXICODONE ) 5 MG immediate release tablet Take 1 tablet (5 mg total) by mouth every 6 (six) hours as needed for pain 20 tablet 0   pantoprazole  (PROTONIX ) 20 MG tablet Take 1 tablet (20 mg total) by mouth daily before breakfast. 90 tablet 3   rosuvastatin  (CRESTOR ) 5 MG tablet Take 1 tablet (5 mg total) by mouth daily. 90 tablet 3   Spacer/Aero-Holding Chambers (AEROCHAMBER PLUS) inhaler Use as instructed 1 each 2   tirzepatide  (MOUNJARO ) 2.5 MG/0.5ML Pen Inject 2.5 mg into the skin once a week. 2 mL 5   Vibegron (GEMTESA) 75 MG TABS Take 1 tablet (75 mg total) by mouth daily.     No current facility-administered medications for this visit.    Allergies as of 02/25/2024 - Review Complete 02/19/2024  Allergen Reaction Noted   Penicillins Hives and Swelling 10/09/2013   Metoprolol  06/29/2014   Nitrofurantoin macrocrystal Other (See Comments)    Other Nausea And Vomiting 06/29/2014    Family History  Problem Relation Age of Onset   Eczema Mother    Asthma Mother    Angioedema Mother    Allergic rhinitis Mother    Valvular heart disease Mother        Aortic valve replacement   Diabetes Mother    Angioedema Sister    Diabetes Sister  Breast cancer Sister        Half Sister   Eczema Brother    Asthma Brother    Allergic rhinitis Brother    Diabetes Brother    Asthma Maternal Aunt    Angioedema Maternal Aunt    Allergic rhinitis Maternal Aunt    Asthma Maternal Aunt    Angioedema Maternal Aunt    Angioedema Maternal Aunt    Allergic rhinitis Maternal Uncle    Allergic rhinitis Paternal Aunt    Breast cancer Paternal Aunt    Angioedema Paternal Uncle    Allergic rhinitis Paternal Uncle    Heart attack Maternal Grandmother        88s   Stroke Maternal Grandmother    Breast cancer Cousin 63   Urticaria Son    Allergic  rhinitis Son    Eczema Son    Asthma Son     Social History   Socioeconomic History   Marital status: Married    Spouse name: Not on file   Number of children: 2   Years of education: College   Highest education level: Not on file  Occupational History   Occupation: works at [employer], student  Tobacco Use   Smoking status: Never   Smokeless tobacco: Never   Tobacco comments:    "only smoked for one year al ong time ago "  Vaping Use   Vaping status: Never Used  Substance and Sexual Activity   Alcohol  use: Yes    Alcohol /week: 0.0 standard drinks of alcohol     Comment: occasional   Drug use: No   Sexual activity: Not on file  Other Topics Concern   Not on file  Social History Narrative   Pt lives with mother, children and husband.   Caffeine use: small coffee per day   1 mountain dew per day   Social Drivers of Corporate investment banker Strain: Not on file  Food Insecurity: Not on file  Transportation Needs: Not on file  Physical Activity: Not on file  Stress: Not on file  Social Connections: Not on file  Intimate Partner Violence: Not on file    Review of Systems:  All other review of systems negative except as mentioned in the HPI.  Physical Exam: Vital signs There were no vitals taken for this visit.  General:   Alert,  Well-developed, well-nourished, pleasant and cooperative in NAD Airway:  Mallampati 2 Lungs:  Clear throughout to auscultation.   Heart:  Regular rate and rhythm; no murmurs, clicks, rubs,  or gallops. Abdomen:  Soft, nontender and nondistended. Normal bowel sounds.   Neuro/Psych:  Normal mood and affect. A and O x 3  Eugenia Hess, MD [facility]

## 2024-02-24 ENCOUNTER — Ambulatory Visit: Payer: Self-pay | Admitting: Urology

## 2024-02-24 ENCOUNTER — Other Ambulatory Visit (HOSPITAL_COMMUNITY): Payer: Self-pay

## 2024-02-24 DIAGNOSIS — N951 Menopausal and female climacteric states: Secondary | ICD-10-CM | POA: Diagnosis not present

## 2024-02-24 LAB — URINE CULTURE

## 2024-02-24 MED ORDER — ESTRADIOL 0.075 MG/24HR TD PTTW
1.0000 | MEDICATED_PATCH | TRANSDERMAL | 4 refills | Status: AC
  Filled 2024-02-24: qty 24, 84d supply, fill #0
  Filled 2024-05-17: qty 24, 84d supply, fill #1
  Filled 2024-08-13: qty 24, 84d supply, fill #2

## 2024-02-25 ENCOUNTER — Encounter: Payer: Self-pay | Admitting: Pediatrics

## 2024-02-25 ENCOUNTER — Ambulatory Visit: Admitting: Pediatrics

## 2024-02-25 VITALS — BP 124/85 | HR 68 | Temp 98.0°F | Resp 11 | Ht 62.0 in | Wt 198.0 lb

## 2024-02-25 DIAGNOSIS — R1084 Generalized abdominal pain: Secondary | ICD-10-CM

## 2024-02-25 DIAGNOSIS — J45909 Unspecified asthma, uncomplicated: Secondary | ICD-10-CM | POA: Diagnosis not present

## 2024-02-25 DIAGNOSIS — Z860100 Personal history of colon polyps, unspecified: Secondary | ICD-10-CM | POA: Diagnosis not present

## 2024-02-25 DIAGNOSIS — K219 Gastro-esophageal reflux disease without esophagitis: Secondary | ICD-10-CM | POA: Diagnosis not present

## 2024-02-25 DIAGNOSIS — E119 Type 2 diabetes mellitus without complications: Secondary | ICD-10-CM | POA: Diagnosis not present

## 2024-02-25 DIAGNOSIS — R140 Abdominal distension (gaseous): Secondary | ICD-10-CM | POA: Diagnosis not present

## 2024-02-25 DIAGNOSIS — K644 Residual hemorrhoidal skin tags: Secondary | ICD-10-CM

## 2024-02-25 DIAGNOSIS — K5900 Constipation, unspecified: Secondary | ICD-10-CM | POA: Diagnosis not present

## 2024-02-25 DIAGNOSIS — K317 Polyp of stomach and duodenum: Secondary | ICD-10-CM | POA: Diagnosis not present

## 2024-02-25 DIAGNOSIS — K580 Irritable bowel syndrome with diarrhea: Secondary | ICD-10-CM | POA: Diagnosis not present

## 2024-02-25 DIAGNOSIS — K648 Other hemorrhoids: Secondary | ICD-10-CM

## 2024-02-25 DIAGNOSIS — R197 Diarrhea, unspecified: Secondary | ICD-10-CM

## 2024-02-25 DIAGNOSIS — I10 Essential (primary) hypertension: Secondary | ICD-10-CM | POA: Diagnosis not present

## 2024-02-25 DIAGNOSIS — R630 Anorexia: Secondary | ICD-10-CM

## 2024-02-25 MED ORDER — SODIUM CHLORIDE 0.9 % IV SOLN
500.0000 mL | Freq: Once | INTRAVENOUS | Status: DC
Start: 2024-02-25 — End: 2024-02-25

## 2024-02-25 NOTE — Patient Instructions (Signed)
**  Handouts given hemorrhoids**   YOU HAD AN ENDOSCOPIC PROCEDURE TODAY AT THE [facility]:   Refer to the procedure report that was given to you for any specific questions about what was found during the examination.  If the procedure report does not answer your questions, please call your gastroenterologist to clarify.  If you requested that your care partner not be given the details of your procedure findings, then the procedure report has been included in a sealed envelope for you to review at your convenience later.  YOU SHOULD EXPECT: Some feelings of bloating in the abdomen. Passage of more gas than usual.  Walking can help get rid of the air that was put into your GI tract during the procedure and reduce the bloating. If you had a lower endoscopy (such as a colonoscopy or flexible sigmoidoscopy) you may notice spotting of blood in your stool or on the toilet paper. If you underwent a bowel prep for your procedure, you may not have a normal bowel movement for a few days.  Please Note:  You might notice some irritation and congestion in your nose or some drainage.  This is from the oxygen used during your procedure.  There is no need for concern and it should clear up in a day or so.  SYMPTOMS TO REPORT IMMEDIATELY:  Following lower endoscopy (colonoscopy or flexible sigmoidoscopy):  Excessive amounts of blood in the stool  Significant tenderness or worsening of abdominal pains  Swelling of the abdomen that is new, acute  Fever of 100F or higher  Following upper endoscopy (EGD)  Vomiting of blood or coffee ground material  New chest pain or pain under the shoulder blades  Painful or persistently difficult swallowing  New shortness of breath  Fever of 100F or higher  Black, tarry-looking stools  For urgent or emergent issues, a gastroenterologist can be reached at any hour by calling [phone]. Do not use MyChart messaging for urgent concerns.    DIET:  We do  recommend a small meal at first, but then you may proceed to your regular diet.  Drink plenty of fluids but you should avoid alcoholic beverages for 24 hours.  ACTIVITY:  You should plan to take it easy for the rest of today and you should NOT DRIVE or use heavy machinery until tomorrow (because of the sedation medicines used during the test).    FOLLOW UP: Our staff will call the number listed on your records the next business day following your procedure.  We will call around 7:15- 8:00 am to check on you and address any questions or concerns that you may have regarding the information given to you following your procedure. If we do not reach you, we will leave a message.     If any biopsies were taken you will be contacted by phone or by letter within the next 1-3 weeks.  Please call us  at [phone] if you have not heard about the biopsies in 3 weeks.    SIGNATURES/CONFIDENTIALITY: You and/or your care partner have signed paperwork which will be entered into your electronic medical record.  These signatures attest to the fact that that the information above on your After Visit Summary has been reviewed and is understood.  Full responsibility of the confidentiality of this discharge information lies with you and/or your care-partner.

## 2024-02-25 NOTE — Progress Notes (Signed)
 Called to room to assist during endoscopic procedure.  Patient ID and intended procedure confirmed with present staff. Received instructions for my participation in the procedure from the performing physician.

## 2024-02-25 NOTE — Op Note (Signed)
 [facility] Patient Name: Carolyn Phillips Procedure Date: 02/25/2024 1:46 PM MRN: [id number] Endoscopist: Eugenia Hess , MD, [id number] Age: 48 Referring MD:  Date of Birth: 07/18/1976 Gender: Female Account #: [id number] Procedure:                Colonoscopy Indications:              Abdominal pain, Constipation, Diarrhea, Bloating,                            History of colon polyps, Last colonoscopy performed                            in 2023 Medicines:                Monitored Anesthesia Care Procedure:                Pre-Anesthesia Assessment:                           - Prior to the procedure, a History and Physical                            was performed, and patient medications and                            allergies were reviewed. The patient's tolerance of                            previous anesthesia was also reviewed. The risks                            and benefits of the procedure and the sedation                            options and risks were discussed with the patient.                            All questions were answered, and informed consent                            was obtained. Prior Anticoagulants: The patient has                            taken no anticoagulant or antiplatelet agents. ASA                            Grade Assessment: II - A patient with mild systemic                            disease. After reviewing the risks and benefits,                            the patient was deemed in satisfactory condition to  undergo the procedure.                           After obtaining informed consent, the colonoscope                            was passed under direct vision. Throughout the                            procedure, the patient's blood pressure, pulse, and                            oxygen saturations were monitored continuously. The                            CF [id number] #[id number] was introduced through  the anus                            and advanced to the terminal ileum. The colonoscopy                            was performed without difficulty. The patient                            tolerated the procedure well. The quality of the                            bowel preparation was good. The terminal ileum,                            ileocecal valve, appendiceal orifice, and rectum                            were photographed. Scope In: 2:10:42 PM Scope Out: 2:25:06 PM Scope Withdrawal Time: 0 hours 11 minutes 38 seconds  Total Procedure Duration: 0 hours 14 minutes 24 seconds  Findings:                 Skin tags were found on perianal exam.                           The digital rectal exam was normal. Pertinent                            negatives include normal sphincter tone and no                            palpable rectal lesions.                           Normal mucosa was found in the entire colon.                            Biopsies for histology were taken with a cold  forceps for evaluation of microscopic colitis.                           The terminal ileum appeared normal. Biopsies were                            taken with a cold forceps for histology.                           Internal hemorrhoids were found during retroflexion. Complications:            No immediate complications. Estimated blood loss:                            Minimal. Estimated Blood Loss:     Estimated blood loss was minimal. Impression:               - Perianal skin tags found on perianal exam.                           - Normal mucosa in the entire examined colon.                            Biopsied.                           - The examined portion of the ileum was normal.                            Biopsied.                           - Internal hemorrhoids. Recommendation:           - Discharge patient to home (ambulatory).                           - Await pathology  results. If pathology results                            from the upper endoscopy and colonoscopy are normal                            consider treatment for possible SIBO and/or bile                            acid diarrhea.                           - Recommend obtaining previous colonoscopy report                            from 2023 which reportedly showed polyps. Pending                            that information additional recommendations can be  made regarding a follow-up interval for next                            colonoscopy.                           - The findings and recommendations were discussed                            with the patient's family.                           - Return to GI clinic in 2 months.                           - Patient has a contact number available for                            emergencies. The signs and symptoms of potential                            delayed complications were discussed with the                            patient. Return to normal activities tomorrow.                            Written discharge instructions were provided to the                            patient. Eugenia Hess, MD 02/25/2024 2:32:52 PM This report has been signed electronically.

## 2024-02-25 NOTE — Op Note (Signed)
 [facility] Patient Name: Carolyn Phillips Procedure Date: 02/25/2024 1:50 PM MRN: [id number] Endoscopist: Eugenia Hess , MD, [id number] Age: 48 Referring MD:  Date of Birth: 28-Mar-1976 Gender: Female Account #: [id number] Procedure:                Upper GI endoscopy Indications:              Abdominal pain, Follow-up of gastro-esophageal                            reflux disease, Abdominal bloating, Diarrhea, Loss                            of appetite Medicines:                Monitored Anesthesia Care Procedure:                Pre-Anesthesia Assessment:                           - Prior to the procedure, a History and Physical                            was performed, and patient medications and                            allergies were reviewed. The patient's tolerance of                            previous anesthesia was also reviewed. The risks                            and benefits of the procedure and the sedation                            options and risks were discussed with the patient.                            All questions were answered, and informed consent                            was obtained. Prior Anticoagulants: The patient has                            taken no anticoagulant or antiplatelet agents. ASA                            Grade Assessment: II - A patient with mild systemic                            disease. After reviewing the risks and benefits,                            the patient was deemed in satisfactory condition to  undergo the procedure.                           After obtaining informed consent, the endoscope was                            passed under direct vision. Throughout the                            procedure, the patient's blood pressure, pulse, and                            oxygen saturations were monitored continuously. The                            GIF [id number] #[id number] was introduced  through the                            mouth, and advanced to the second part of duodenum.                            The upper GI endoscopy was accomplished without                            difficulty. The patient tolerated the procedure                            well. Scope In: Scope Out: Findings:                 The examined esophagus was normal.                           The gastric body, gastric antrum, cardia (on                            retroflexion) and gastric fundus (on retroflexion)                            were normal. Biopsies were taken with a cold                            forceps for Helicobacter pylori testing.                           A few diminutive sessile polyps were found in the                            gastric fundus and in the gastric body. Biopsies                            were taken with a cold forceps for histology.                           The duodenal bulb and second portion of the  duodenum were normal. Complications:            No immediate complications. Estimated blood loss:                            Minimal. Estimated Blood Loss:     Estimated blood loss was minimal. Impression:               - Normal esophagus.                           - Normal gastric body, antrum, cardia and gastric                            fundus. Biopsied.                           - A few gastric polyps. Biopsied.                           - Normal duodenal bulb and second portion of the                            duodenum. Recommendation:           - Await pathology results.                           - Perform a colonoscopy today.                           - The findings and recommendations were discussed                            with the patient's family. Eugenia Hess, MD 02/25/2024 2:27:15 PM This report has been signed electronically.

## 2024-02-25 NOTE — Progress Notes (Signed)
 Pt's states no medical or surgical changes since previsit or office visit.

## 2024-02-25 NOTE — Progress Notes (Signed)
 1352 Robinul  0.1 mg IV given due large amount of secretions upon assessment. Patient experiencing nausea.  MD updated and Zofran  4 mg IV given, vss

## 2024-02-25 NOTE — Progress Notes (Signed)
 Report given to PACU, vss

## 2024-02-26 ENCOUNTER — Telehealth: Payer: Self-pay

## 2024-02-26 NOTE — Telephone Encounter (Signed)
 Left message

## 2024-03-01 ENCOUNTER — Other Ambulatory Visit: Payer: Self-pay

## 2024-03-01 ENCOUNTER — Ambulatory Visit: Payer: Self-pay | Admitting: Pediatrics

## 2024-03-01 LAB — SURGICAL PATHOLOGY

## 2024-03-02 ENCOUNTER — Telehealth: Payer: Self-pay

## 2024-03-02 NOTE — Telephone Encounter (Signed)
-----   Message from Truddie Furrow sent at 03/01/2024  8:13 PM EDT ----- Regarding: Obtain prior colonoscopy report [facility] -  Ms. Chrostowski reported to [place] that she had a colonoscopy at [facility] in 2023 that disclosed polyps.  Can you please contact [facility] to obtain that result for further review?  I will need that information to factor in when she will be due for her next colonoscopy.  Thanks,  Haskell Linker

## 2024-03-02 NOTE — Telephone Encounter (Signed)
 Called [facility] medical records department. Left a detailed vm with request for last colonoscopy report and pathology results for continuity of care. I provided my direct office phone number and 2nd floor fax number for records. Will await fax, if no fax received by end of week will call back to follow up.

## 2024-03-03 ENCOUNTER — Other Ambulatory Visit (HOSPITAL_COMMUNITY): Payer: Self-pay

## 2024-03-03 ENCOUNTER — Other Ambulatory Visit: Payer: Self-pay

## 2024-03-03 MED ORDER — ALPRAZOLAM 0.25 MG PO TABS
0.2500 mg | ORAL_TABLET | Freq: Two times a day (BID) | ORAL | 0 refills | Status: DC | PRN
  Filled 2024-03-03: qty 60, 30d supply, fill #0

## 2024-03-04 ENCOUNTER — Other Ambulatory Visit (HOSPITAL_COMMUNITY): Payer: Self-pay

## 2024-03-05 NOTE — Telephone Encounter (Signed)
 No.  No one has given me anything for her.

## 2024-03-11 NOTE — Telephone Encounter (Signed)
 Notes reviewed from [facility] gastroenterology and added to my note 01/27/2024. 01/18/2022 colonoscopy with endoscopy with Dr. Veronda Goody for rectal bleeding and abdominal discomfort good bowel prep showed hemorrhoids, normal TI, 4 mm polyp transverse colon 6 mm polyp sigmoid colon normal mucosa biopsies were taken, internal hemorrhoids small Pathology showed tubular adenomatous polyps and colonic mucosa negative for lymphocytic and collagenous colitis or dysplasia Notes also state previous colonoscopy December 2020 small polyp sigmoid colon and internal hemorrhoids showed benign polypoid tissue repeat 5 years due to family history

## 2024-03-11 NOTE — Telephone Encounter (Signed)
 Refer back to phone note 03/02/24. Carolyn Phillips has reviewed & forwarded to Dr. Yvone Herd.

## 2024-03-23 ENCOUNTER — Other Ambulatory Visit: Payer: Self-pay | Admitting: Internal Medicine

## 2024-03-23 ENCOUNTER — Other Ambulatory Visit (HOSPITAL_COMMUNITY): Payer: Self-pay

## 2024-03-23 DIAGNOSIS — I493 Ventricular premature depolarization: Secondary | ICD-10-CM

## 2024-03-24 ENCOUNTER — Other Ambulatory Visit: Payer: Self-pay

## 2024-03-24 ENCOUNTER — Other Ambulatory Visit (HOSPITAL_COMMUNITY): Payer: Self-pay

## 2024-03-24 DIAGNOSIS — M722 Plantar fascial fibromatosis: Secondary | ICD-10-CM | POA: Diagnosis not present

## 2024-03-24 MED ORDER — FLECAINIDE ACETATE 50 MG PO TABS
50.0000 mg | ORAL_TABLET | Freq: Two times a day (BID) | ORAL | 0 refills | Status: DC
  Filled 2024-03-24: qty 60, 30d supply, fill #0

## 2024-03-24 MED ORDER — ROSUVASTATIN CALCIUM 5 MG PO TABS
5.0000 mg | ORAL_TABLET | Freq: Every day | ORAL | 3 refills | Status: AC
  Filled 2024-03-24: qty 90, 90d supply, fill #0
  Filled 2024-05-17 – 2024-06-18 (×3): qty 90, 90d supply, fill #1
  Filled 2024-09-27: qty 90, 90d supply, fill #2

## 2024-03-25 ENCOUNTER — Other Ambulatory Visit: Payer: Self-pay

## 2024-03-25 ENCOUNTER — Other Ambulatory Visit (HOSPITAL_COMMUNITY): Payer: Self-pay

## 2024-03-25 MED ORDER — FAMOTIDINE 20 MG PO TABS
20.0000 mg | ORAL_TABLET | Freq: Two times a day (BID) | ORAL | 3 refills | Status: DC
  Filled 2024-03-25: qty 60, 30d supply, fill #0
  Filled 2024-04-19: qty 60, 30d supply, fill #1
  Filled 2024-05-17: qty 60, 30d supply, fill #2
  Filled 2024-06-11: qty 60, 30d supply, fill #3

## 2024-03-25 MED ORDER — MELOXICAM 15 MG PO TABS
15.0000 mg | ORAL_TABLET | Freq: Every day | ORAL | 2 refills | Status: DC
  Filled 2024-03-25: qty 60, 60d supply, fill #0
  Filled 2024-04-28 – 2024-05-17 (×2): qty 60, 60d supply, fill #1
  Filled 2024-06-11 – 2024-08-12 (×2): qty 60, 60d supply, fill #2

## 2024-03-25 MED ORDER — METHOCARBAMOL 500 MG PO TABS
500.0000 mg | ORAL_TABLET | Freq: Four times a day (QID) | ORAL | 2 refills | Status: DC | PRN
  Filled 2024-03-25: qty 60, 8d supply, fill #0
  Filled 2024-04-28: qty 60, 8d supply, fill #1
  Filled 2024-05-17: qty 60, 8d supply, fill #2

## 2024-03-31 DIAGNOSIS — M7662 Achilles tendinitis, left leg: Secondary | ICD-10-CM | POA: Diagnosis not present

## 2024-03-31 DIAGNOSIS — M7661 Achilles tendinitis, right leg: Secondary | ICD-10-CM | POA: Diagnosis not present

## 2024-03-31 DIAGNOSIS — M722 Plantar fascial fibromatosis: Secondary | ICD-10-CM | POA: Diagnosis not present

## 2024-03-31 DIAGNOSIS — M25531 Pain in right wrist: Secondary | ICD-10-CM | POA: Diagnosis not present

## 2024-04-06 ENCOUNTER — Other Ambulatory Visit: Payer: Self-pay | Admitting: Internal Medicine

## 2024-04-06 ENCOUNTER — Other Ambulatory Visit: Payer: Self-pay | Admitting: Urology

## 2024-04-06 ENCOUNTER — Other Ambulatory Visit (HOSPITAL_COMMUNITY): Payer: Self-pay

## 2024-04-06 ENCOUNTER — Other Ambulatory Visit: Payer: Self-pay

## 2024-04-06 ENCOUNTER — Encounter: Payer: Self-pay | Admitting: Urology

## 2024-04-06 DIAGNOSIS — R200 Anesthesia of skin: Secondary | ICD-10-CM | POA: Diagnosis not present

## 2024-04-06 DIAGNOSIS — N3281 Overactive bladder: Secondary | ICD-10-CM

## 2024-04-06 DIAGNOSIS — I493 Ventricular premature depolarization: Secondary | ICD-10-CM

## 2024-04-06 MED ORDER — MONTELUKAST SODIUM 10 MG PO TABS
10.0000 mg | ORAL_TABLET | Freq: Every day | ORAL | 2 refills | Status: AC
  Filled 2024-04-06: qty 90, 90d supply, fill #0
  Filled 2024-05-17 – 2024-07-09 (×3): qty 90, 90d supply, fill #1
  Filled 2024-11-01: qty 90, 90d supply, fill #2

## 2024-04-06 MED ORDER — GEMTESA 75 MG PO TABS
75.0000 mg | ORAL_TABLET | Freq: Every day | ORAL | 11 refills | Status: DC
  Filled 2024-04-06: qty 30, 30d supply, fill #0

## 2024-04-07 ENCOUNTER — Other Ambulatory Visit: Payer: Self-pay

## 2024-04-08 ENCOUNTER — Other Ambulatory Visit (HOSPITAL_COMMUNITY): Payer: Self-pay

## 2024-04-08 MED ORDER — FLECAINIDE ACETATE 50 MG PO TABS
50.0000 mg | ORAL_TABLET | Freq: Two times a day (BID) | ORAL | 0 refills | Status: DC
Start: 2024-04-08 — End: 2024-05-17
  Filled 2024-04-08 – 2024-04-19 (×2): qty 30, 15d supply, fill #0

## 2024-04-13 ENCOUNTER — Other Ambulatory Visit: Payer: Self-pay | Admitting: Urology

## 2024-04-13 ENCOUNTER — Other Ambulatory Visit (HOSPITAL_COMMUNITY): Payer: Self-pay

## 2024-04-13 ENCOUNTER — Encounter: Payer: Self-pay | Admitting: Urology

## 2024-04-13 DIAGNOSIS — N3281 Overactive bladder: Secondary | ICD-10-CM

## 2024-04-13 MED ORDER — MIRABEGRON ER 50 MG PO TB24
50.0000 mg | ORAL_TABLET | Freq: Every day | ORAL | 11 refills | Status: AC
  Filled 2024-04-13: qty 30, 30d supply, fill #0
  Filled 2024-05-17: qty 30, 30d supply, fill #1
  Filled 2024-06-11: qty 30, 30d supply, fill #2
  Filled 2024-07-09: qty 30, 30d supply, fill #3
  Filled 2024-08-13 (×2): qty 30, 30d supply, fill #4
  Filled 2024-11-01: qty 30, 30d supply, fill #5

## 2024-04-14 DIAGNOSIS — G5601 Carpal tunnel syndrome, right upper limb: Secondary | ICD-10-CM | POA: Diagnosis not present

## 2024-04-14 DIAGNOSIS — G5603 Carpal tunnel syndrome, bilateral upper limbs: Secondary | ICD-10-CM | POA: Diagnosis not present

## 2024-04-14 DIAGNOSIS — G5602 Carpal tunnel syndrome, left upper limb: Secondary | ICD-10-CM | POA: Diagnosis not present

## 2024-04-16 ENCOUNTER — Other Ambulatory Visit (HOSPITAL_COMMUNITY): Payer: Self-pay

## 2024-04-19 ENCOUNTER — Other Ambulatory Visit (HOSPITAL_COMMUNITY): Payer: Self-pay

## 2024-04-19 ENCOUNTER — Other Ambulatory Visit: Payer: Self-pay

## 2024-04-19 MED ORDER — ALPRAZOLAM 0.25 MG PO TABS
0.2500 mg | ORAL_TABLET | Freq: Two times a day (BID) | ORAL | 0 refills | Status: DC | PRN
  Filled 2024-04-19: qty 60, 30d supply, fill #0

## 2024-04-20 ENCOUNTER — Other Ambulatory Visit (HOSPITAL_COMMUNITY): Payer: Self-pay

## 2024-04-20 DIAGNOSIS — E1169 Type 2 diabetes mellitus with other specified complication: Secondary | ICD-10-CM | POA: Diagnosis not present

## 2024-04-20 DIAGNOSIS — I2510 Atherosclerotic heart disease of native coronary artery without angina pectoris: Secondary | ICD-10-CM | POA: Diagnosis not present

## 2024-04-20 DIAGNOSIS — Z794 Long term (current) use of insulin: Secondary | ICD-10-CM | POA: Diagnosis not present

## 2024-04-20 DIAGNOSIS — I10 Essential (primary) hypertension: Secondary | ICD-10-CM | POA: Diagnosis not present

## 2024-04-20 DIAGNOSIS — E669 Obesity, unspecified: Secondary | ICD-10-CM | POA: Diagnosis not present

## 2024-04-20 MED ORDER — HUMALOG KWIKPEN 200 UNIT/ML ~~LOC~~ SOPN
10.0000 [IU] | PEN_INJECTOR | Freq: Three times a day (TID) | SUBCUTANEOUS | 5 refills | Status: DC
  Filled 2024-04-20: qty 12, 80d supply, fill #0
  Filled 2024-05-17: qty 12, 80d supply, fill #1

## 2024-04-21 ENCOUNTER — Other Ambulatory Visit (HOSPITAL_COMMUNITY): Payer: Self-pay

## 2024-04-23 ENCOUNTER — Encounter: Payer: Self-pay | Admitting: Allergy

## 2024-04-23 ENCOUNTER — Ambulatory Visit: Admitting: Allergy

## 2024-04-23 ENCOUNTER — Other Ambulatory Visit: Payer: Self-pay

## 2024-04-23 VITALS — BP 118/82 | HR 70 | Temp 97.8°F | Ht 63.58 in | Wt 196.7 lb

## 2024-04-23 DIAGNOSIS — K9049 Malabsorption due to intolerance, not elsewhere classified: Secondary | ICD-10-CM | POA: Diagnosis not present

## 2024-04-23 DIAGNOSIS — J3089 Other allergic rhinitis: Secondary | ICD-10-CM

## 2024-04-23 DIAGNOSIS — G5603 Carpal tunnel syndrome, bilateral upper limbs: Secondary | ICD-10-CM | POA: Diagnosis not present

## 2024-04-23 DIAGNOSIS — J302 Other seasonal allergic rhinitis: Secondary | ICD-10-CM | POA: Diagnosis not present

## 2024-04-23 DIAGNOSIS — H1013 Acute atopic conjunctivitis, bilateral: Secondary | ICD-10-CM

## 2024-04-23 DIAGNOSIS — J4520 Mild intermittent asthma, uncomplicated: Secondary | ICD-10-CM | POA: Diagnosis not present

## 2024-04-23 DIAGNOSIS — J329 Chronic sinusitis, unspecified: Secondary | ICD-10-CM

## 2024-04-23 DIAGNOSIS — L853 Xerosis cutis: Secondary | ICD-10-CM

## 2024-04-23 DIAGNOSIS — M7661 Achilles tendinitis, right leg: Secondary | ICD-10-CM | POA: Diagnosis not present

## 2024-04-23 MED ORDER — RYALTRIS 665-25 MCG/ACT NA SUSP: NASAL | 5 refills | Status: AC

## 2024-04-23 NOTE — Patient Instructions (Addendum)
 Allergic Rhinitis with conjunctivitis - Continue Ryaltris  nasal spray 2 sprays each nostril up to twice a day for runny or stuffy nose control  With using nasal sprays point tip of bottle toward eye on same side nostril and lean head slightly forward for best technique.   - Recommend continuing nasal saline rinses couple times a week (can increase to daily use if needed).   - Continue Xyzal  and Singulair , administer Singulair  at night. - Environmental allergy testing is positive to dust mites, mold, tree pollen, weed pollen, grass pollen.    Recurrent Sinus Infections Reactive airway due to illness - Increase frequency of saline rinses during infections. - Have access to albuterol  inhaler 2 puffs every 4-6 hours as needed for cough/wheeze/shortness of breath/chest tightness.  May use 15-20 minutes prior to activity.   Monitor frequency of use.   - Will plan to obtain spirometry (lung function testing) next visit  Dry Skin - Continue using Vaseline application after bathing - Continue to ensure adequate hydration. - Continue use of sunscreen  Lactose Intolerance - Continue using lactose-free products and lactase supplements as needed.  Follow-up in 6 months

## 2024-04-23 NOTE — Progress Notes (Signed)
 Follow-up Note  RE: Carolyn Phillips MRN: [id number] DOB: 01/18/1976 Date of Office Visit: 04/23/2024   History of present illness: Carolyn Phillips is a 48 y.o. female presenting today for follow-up of allergic rhinitis with conjunctivitis, reactive airway, recurrent sinus infections, food intolerance.  She was last seen in the office on 12/19/23 by myself.  Discussed the use of AI scribe software for clinical note transcription with the patient, who gave verbal consent to proceed.  She has ongoing allergy issues, exacerbated this year by high pollen counts, causing sneezing. She manages symptoms with Xyzal  and Singulair , and uses Ryaltris  nasal spray, initially twice daily, now reduced to once daily with two sprays per use. She has not needed her inhaler recently. No recent infections such as sinusitis or pneumonia, and no antibiotic use.  She has elevated blood sugars and was recently diagnosed with glucose toxicity. Her A1c increased from 6 to 10, leading to a change in diabetes management. She started Humalog  this week to help control her blood sugar levels.  She experiences numbness and tingling, particularly in her arms, and has been diagnosed with carpal tunnel syndrome. There is suspicion of ulnar nerve damage, and she underwent a nerve conduction test last week, which resulted in bruising. The numbness and tingling are worse in one arm.  She has been diagnosed with plantar fasciitis in both feet and uses shoe inserts for management.  She recently experienced a headache for two days, which she thought might be related to stress from moving to a new house or from cutting grass and doing mulch work. She also reports feeling itchy with red marks and bumps, suspected to be related to nerves.  She follows a lactose-free diet to manage her symptoms.    She uses SPF 50 sunscreen regularly.      Review of systems: 10pt ROS negative unless noted above in HPI   Past  medical/social/surgical/family history have been reviewed and are unchanged unless specifically indicated below.  No changes  Medication List: Current Outpatient Medications  Medication Sig Dispense Refill   acetaminophen  (TYLENOL ) 500 MG tablet Take 1,000 mg by mouth every 6 (six) hours as needed for mild pain (pain score 1-3).     albuterol  (VENTOLIN  HFA) 108 (90 Base) MCG/ACT inhaler Inhale 2 puffs into the lungs every 4 to 6 hours as needed for shortness of breath or wheezing. 6.7 g 1   ALPRAZolam  (XANAX ) 0.25 MG tablet Take 1 tablet (0.25 mg total) by mouth 2 (two) times daily as needed. 60 tablet 0   cephALEXin  (KEFLEX ) 250 MG capsule Take 1 capsule (250 mg total) by mouth at bedtime. 30 capsule 30   Cholecalciferol (VITAMIN D3) 125 MCG (5000 UT) CAPS Take 5,000 Units by mouth daily.      Continuous Glucose Sensor (FREESTYLE LIBRE 3 PLUS SENSOR) MISC Apply one sensor every 15 days to continuously monitor blood glucose. 6 each 3   Continuous Glucose Sensor (FREESTYLE LIBRE 3 SENSOR) MISC Change sensor topically every 14 (fourteen) days to monitor blood glucose continuously 2 each 5   estradiol  (DOTTI ) 0.075 MG/24HR Place 1 patch onto the skin 2 (two) times a week. 24 patch 4   estradiol  (ESTRACE ) 0.1 MG/GM vaginal cream Apply 3 times weekly as directed using a pea-sized amount on fingertip. (Patient taking differently: Apply 3 times weekly as directed using a pea-sized amount on fingertip. Monday Tuesday and Friday) 42.5 g 12   famotidine  (PEPCID ) 20 MG tablet Take 1 tablet (20 mg  total) by mouth 2 (two) times daily. 60 tablet 3   flecainide  (TAMBOCOR ) 50 MG tablet Take 1 tablet (50 mg total) by mouth 2 (two) times daily. Please advice patient to call our office to schedule overdue office visit for future refill. [phone]. Thank you and 1st attempt. 30 tablet 0   glucose blood (FREESTYLE LITE) test strip Use to check blood sugar 1 time per day. Dx Code: E11.9 100 each 2    hydrochlorothiazide  (HYDRODIURIL ) 25 MG tablet Take 1 tablet (25 mg total) by mouth daily. FOR BLOOD PRESSURE/SWELLING 90 tablet 3   hydrocortisone  (ANUSOL -HC) 25 MG suppository Place 1 suppository (25 mg total) rectally at bedtime for 14 days. 14 suppository 1   ibuprofen  (ADVIL ,MOTRIN ) 800 MG tablet Take 1 tablet (800 mg total) by mouth 3 (three) times daily. 21 tablet 0   insulin  glargine, 2 Unit Dial , (TOUJEO  MAX SOLOSTAR) 300 UNIT/ML Solostar Pen Inject 36 Units into the skin daily and increase as directed in clinic 9 mL 5   insulin  glargine, 2 Unit Dial , (TOUJEO  MAX SOLOSTAR) 300 UNIT/ML Solostar Pen Inject 36 Units (up to 70 units) into the skin daily. Increase as directed in clinic. 9 mL 3   insulin  glargine, 2 Unit Dial , (TOUJEO  MAX SOLOSTAR) 300 UNIT/ML Solostar Pen inject 46 units daily Subcutaneous (increase by 2 units every week if average 6am is >150 up to a max dose of 70 units) 6 mL 11   insulin  lispro (HUMALOG  KWIKPEN) 200 UNIT/ML KwikPen Inject 10 Units into the skin 3 (three) times daily. 15 mL 5   Insulin  Pen Needle 32G X 4 MM MISC as directed with [place]a  daily 90 days 100 each 3   Lancets (FREESTYLE) lancets Use to check blood sugar 1 time per day. Dx Code: E11.9 100 each 2   levocetirizine (XYZAL ) 5 MG tablet TAKE 1 TABLET BY MOUTH AT BEDTIME 90 tablet 4   meclizine  (ANTIVERT ) 25 MG tablet TAKE 1 TABLET ( 25MG ) BY MOUTH THREE TIME DAILY AS NEEDED FOR DIZZINESS     meloxicam  (MOBIC ) 15 MG tablet Take 1 tablet by mouth once a day with food for inflammation 60 tablet 2   methocarbamol  (ROBAXIN ) 500 MG tablet Take 1-2 tablet by mouth every six to eight hours as needed FOR SPASMS/MUSCLE TENSION 60 tablet 2   mirabegron  ER (MYRBETRIQ ) 50 MG TB24 tablet Take 1 tablet (50 mg total) by mouth daily. 30 tablet 11   montelukast  (SINGULAIR ) 10 MG tablet Take 1 tablet (10 mg total) by mouth daily. 90 tablet 2   nebivolol  (BYSTOLIC ) 10 MG tablet Take 1 tablet (10 mg total) by mouth at bedtime.  90 tablet 2   ondansetron  (ZOFRAN -ODT) 4 MG disintegrating tablet Take 1 tablet (4 mg total) by mouth every 8 (eight) hours as needed for nausea for up to 7 days 15 tablet 0   rosuvastatin  (CRESTOR ) 5 MG tablet Take 1 tablet (5 mg total) by mouth daily. 90 tablet 3   Spacer/Aero-Holding Chambers (AEROCHAMBER PLUS) inhaler Use as instructed 1 each 2   tirzepatide  (MOUNJARO ) 2.5 MG/0.5ML Pen Inject 2.5 mg into the skin once a week. 2 mL 5   estradiol  (DOTTI ) 0.05 MG/24HR patch Place 1 patch (0.05 mg total) onto the skin 2 (two) times a week. (Patient taking differently: Place 1 patch onto the skin 2 (two) times a week. Sunday and Wednesday) 24 patch 4   hyoscyamine  (LEVSIN ) 0.125 MG tablet Take 1 tablet (0.125 mg total) by mouth every 6 (  six) hours as needed for cramping. 30 tablet 0   Hyoscyamine  Sulfate SL 0.125 MG SUBL Place 1 tablet ( 0.125 mg) under the tongue and allow to dissolve 2 (two) times daily as needed. 60 tablet 1   Multiple Vitamins-Minerals (ZINC PO) Take 1 tablet by mouth daily. (Patient not taking: Reported on 04/23/2024)     oxyCODONE  (OXY IR/ROXICODONE ) 5 MG immediate release tablet Take 1 tablet (5 mg total) by mouth every 6 (six) hours as needed for pain (Patient not taking: Reported on 04/23/2024) 20 tablet 0   pantoprazole  (PROTONIX ) 20 MG tablet Take 1 tablet (20 mg total) by mouth daily before breakfast. 90 tablet 3   No current facility-administered medications for this visit.     Known medication allergies: Allergies  Allergen Reactions   Penicillins Hives and Swelling    Has patient had a PCN reaction causing immediate rash, facial/tongue/throat swelling, SOB or lightheadedness with hypotension: Yes Has patient had a PCN reaction causing severe rash involving mucus membranes or skin necrosis: No Has patient had a PCN reaction that required hospitalization No Has patient had a PCN reaction occurring within the last 10 years: No If all of the above answers are NO,  then may proceed with Cephalosporin use.   Eye swelling   Other Nausea And Vomiting    General Anesthesia    Metoprolol Other (See Comments)    LOL Drugs cause hair to fall out   Nitrofurantoin Macrocrystal Other (See Comments)    Pt unsure of reaction      Physical examination: Blood pressure 118/82, pulse 70, temperature 97.8 F (36.6 C), temperature source Temporal, height 5' 3.58 (1.615 m), weight 196 lb 11.2 oz (89.2 kg), SpO2 98%.  General: Alert, interactive, in no acute distress. HEENT: PERRLA, TMs pearly gray, turbinates non-edematous without discharge, post-pharynx non erythematous. Neck: Supple without lymphadenopathy. Lungs: Clear to auscultation without wheezing, rhonchi or rales. {no increased work of breathing. CV: Normal S1, S2 without murmurs. Abdomen: Nondistended, nontender. Skin: Quarter size ecchymosis of right forearm. Extremities:  No clubbing, cyanosis or edema. Neuro:   Grossly intact.  Diagnostics/Labs: Component     Latest Ref Rng 12/19/2023  IgE (Immunoglobulin E), Serum     6 - 495 IU/mL 147   D Pteronyssinus IgE     Class 0/I kU/L 0.23 !   D Farinae IgE     Class 0/I kU/L 0.24 !   Cat Dander IgE     Class 0 kU/L <0.10   Dog Dander IgE     Class 0 kU/L <0.10   Mouse Urine IgE     Class 0 kU/L <0.10   [place] Grass IgE     Class III kU/L 1.56 !   Timothy Grass IgE     Class IV kU/L 5.84 !   [place] Grass IgE     Class IV kU/L 5.56 !   Cockroach, [place] IgE     Class 0/I kU/L 0.19 !   Penicillium Chrysogen IgE     Class 0/I kU/L 0.17 !   Cladosporium Herbarum IgE     Class I kU/L 0.39 !   Aspergillus Fumigatus IgE     Class I kU/L 0.52 !   Alternaria Alternata IgE     Class III kU/L 2.68 !   Maple/Box Elder IgE     Class I kU/L 0.38 !   Common Silver [place] IgE     Class 0/I kU/L 0.20 !   [place], [place] IgE  Class 0/I kU/L 0.26 !   [place], [place] IgE     Class 0/I kU/L 0.22 !   Elm, American IgE     Class I kU/L 0.42 !    [place] IgE     Class II kU/L 0.60 !   Pecan, [place] IgE     Class II kU/L 1.33 !   White Mulberry IgE     Class 0/I kU/L 0.15 !   Ragweed, Short IgE     Class II kU/L 0.80 !   Pigweed, Rough IgE     Class 0/I kU/L 0.19 !   Sheep Sorrel IgE Qn     Class 0/I kU/L 0.20 !    Assessment and plan:   Allergic Rhinitis with conjunctivitis - Continue Ryaltris  nasal spray 2 sprays each nostril up to twice a day for runny or stuffy nose control  With using nasal sprays point tip of bottle toward eye on same side nostril and lean head slightly forward for best technique.   - Recommend continuing nasal saline rinses couple times a week (can increase to daily use if needed).   - Continue Xyzal  and Singulair , administer Singulair  at night. - Environmental allergy testing is positive to dust mites, mold, tree pollen, weed pollen, grass pollen.    Recurrent Sinus Infections Reactive airway due to illness - Increase frequency of saline rinses during infections. - Have access to albuterol  inhaler 2 puffs every 4-6 hours as needed for cough/wheeze/shortness of breath/chest tightness.  May use 15-20 minutes prior to activity.   Monitor frequency of use.   - Will plan to obtain spirometry (lung function testing) next visit  Dry Skin - Continue using Vaseline application after bathing - Continue to ensure adequate hydration. - Continue use of sunscreen  Lactose Intolerance - Continue using lactose-free products and lactase supplements as needed.  Follow-up in 6 months  I appreciate the opportunity to take part in Carolyn Phillips's care. Please do not hesitate to contact me with questions.  Sincerely,   Danita Brain, MD Allergy/Immunology [facility]

## 2024-04-29 ENCOUNTER — Other Ambulatory Visit (HOSPITAL_COMMUNITY): Payer: Self-pay

## 2024-04-29 ENCOUNTER — Other Ambulatory Visit: Payer: Self-pay

## 2024-04-29 DIAGNOSIS — G5601 Carpal tunnel syndrome, right upper limb: Secondary | ICD-10-CM | POA: Diagnosis not present

## 2024-04-29 DIAGNOSIS — G5602 Carpal tunnel syndrome, left upper limb: Secondary | ICD-10-CM | POA: Diagnosis not present

## 2024-05-04 NOTE — Progress Notes (Signed)
 [facility] Return Visit   Referring Provider Larnell Hamilton, MD [address] [place],  [place] [address]  Primary Care Provider Larnell Hamilton, MD  Patient Profile: Carolyn Phillips is a 48 y.o. female with a past medical history of Wolff-Parkinson-White, HTN, OSA, T2DM, urinary incontinence, dyslipidemia who returns to the [facility] for follow-up of the problem(s) noted below.  Problem List: GERD IBS-D History of adenomatous colon polyps Family history of colon polyps Hepatic steatosis   History of Present Illness   [name] was last seen in the GI office 01/27/2024 by Alan Coombs, PA   Current GI Meds  Famotidine  20 mg p.o. twice daily  Interval History   Discussed the use of AI scribe software for clinical note transcription with the patient, who gave verbal consent to proceed.  History of Present Illness IBS-D - Completed EGD/colonoscopy 01/2024 overall normal without H. pylori, celiac disease, IBD or microscopic colitis  - Ongoing abdominal discomfort, bloating, and distension - Symptoms improve after bowel movements - No associated gassiness  - Bowel urgency, particularly in the morning and within 30-40 minutes after eating - Stools generally soft and formed - Sensation of incomplete evacuation - No constipation  - Dietary modifications, including consuming high-carbohydrate and high-fat foods earlier in the day and focusing on protein and vegetables for dinner, have not alleviated symptoms - Benefiber and pre/probiotic supplement used to maintain regular bowel movements - Mounjaro  taken once weekly  History of adenomatous colon polyps - History of colonic polyps with previous colonoscopy 2023 revealing 4mm and 6mm polyps - TA - No polyps on most recent colonoscopy 2025 - Reports that her mother had colon polyps - No family history of colorectal cancer  GERD - EGD 2025 only showed fundic gland polyps -  Currently stable on famotidine  20 mg p.o. twice daily - Previous symptoms may be related to Mounjaro   Hepatic steatosis - CT imaging 11/2023 showed mild hepatic steatosis - LFTs generally normal except ALT 37 in March 2025 - No documented family history of liver disease - Metabolic risk factors for MASLD: T2DM, HLD, obesity -  Fibrosis 4 Score = .53 (Low risk)           GI Review of Symptoms Significant for fecal urgency, bloating, soft stool. Otherwise negative.  General Review of Systems  Review of systems is significant for the pertinent positives and negatives as listed per the HPI.  Full ROS is otherwise negative.  Past Medical History   Past Medical History:  Diagnosis Date   Angio-edema    Asthma    Beta thalassemia trait    Complication of anesthesia    Diabetes mellitus without complication (HCC)    Dysrhythmia    WPW-ablation   Eczema    GERD (gastroesophageal reflux disease)    Headache    Hyperlipemia    Hypertension    Obesity    PONV (postoperative nausea and vomiting)    Recurrent upper respiratory infection (URI)    Urticaria    Wolff-Parkinson-White syndrome      Past Surgical History   Past Surgical History:  Procedure Laterality Date   ABDOMINAL HYSTERECTOMY     BLADDER REPAIR     BREAST REDUCTION SURGERY     CHOLECYSTECTOMY N/A 10/25/2021   Procedure: LAPAROSCOPIC CHOLECYSTECTOMY WITH INTRAOPERATIVE CHOLANGIOGRAM;  Surgeon: Belinda Cough, MD;  Location: [facility] OR;  Service: General;  Laterality: N/A;   CHONDROPLASTY Left 09/13/2015   Procedure: CHONDROPLASTY;  Surgeon: Dempsey Sensor, MD;  Location: [facility];  Service: Orthopedics;  Laterality: Left;   KNEE ARTHROSCOPY WITH DRILLING/MICROFRACTURE Left 09/13/2015   Procedure: KNEE ARTHROSCOPY WITH DRILLING/MICROFRACTURE;  Surgeon: Dempsey Sensor, MD;  Location: [facility];  Service: Orthopedics;  Laterality: Left;   REDUCTION MAMMAPLASTY Bilateral 2001    SUPRAVENTRICULAR TACHYCARDIA ABLATION N/A 07/04/2014   Procedure: WPW ABLATION;  Surgeon: Danelle LELON Birmingham, MD;  Location: [facility] CATH LAB;  Service: Cardiovascular;  Laterality: N/A;   TUBAL LIGATION       Allergies and Medications   Allergies  Allergen Reactions   Penicillins Hives and Swelling    Has patient had a PCN reaction causing immediate rash, facial/tongue/throat swelling, SOB or lightheadedness with hypotension: Yes Has patient had a PCN reaction causing severe rash involving mucus membranes or skin necrosis: No Has patient had a PCN reaction that required hospitalization No Has patient had a PCN reaction occurring within the last 10 years: No If all of the above answers are NO, then may proceed with Cephalosporin use.   Eye swelling   Other Nausea And Vomiting    General Anesthesia    Metoprolol Other (See Comments)    LOL Drugs cause hair to fall out   Nitrofurantoin Macrocrystal Other (See Comments)    Pt unsure of reaction    Current Meds  Medication Sig   acetaminophen  (TYLENOL ) 500 MG tablet Take 1,000 mg by mouth every 6 (six) hours as needed for mild pain (pain score 1-3).   albuterol  (VENTOLIN  HFA) 108 (90 Base) MCG/ACT inhaler Inhale 2 puffs into the lungs every 4 to 6 hours as needed for shortness of breath or wheezing.   ALPRAZolam  (XANAX ) 0.25 MG tablet Take 1 tablet (0.25 mg total) by mouth 2 (two) times daily as needed. (Patient taking differently: Take 0.25 mg by mouth daily.)   cephALEXin  (KEFLEX ) 250 MG capsule Take 1 capsule (250 mg total) by mouth at bedtime.   Cholecalciferol (VITAMIN D3) 125 MCG (5000 UT) CAPS Take 5,000 Units by mouth daily.    Continuous Glucose Sensor (FREESTYLE LIBRE 3 PLUS SENSOR) MISC Apply one sensor every 15 days to continuously monitor blood glucose.   Continuous Glucose Sensor (FREESTYLE LIBRE 3 SENSOR) MISC Change sensor topically every 14 (fourteen) days to monitor blood glucose continuously   dicyclomine  (BENTYL ) 20 MG  tablet Take 1 tablet (20 mg total) by mouth 3 (three) times daily before meals.   estradiol  (DOTTI ) 0.075 MG/24HR Place 1 patch onto the skin 2 (two) times a week.   estradiol  (ESTRACE ) 0.1 MG/GM vaginal cream Apply 3 times weekly as directed using a pea-sized amount on fingertip.   famotidine  (PEPCID ) 20 MG tablet Take 1 tablet (20 mg total) by mouth 2 (two) times daily.   flecainide  (TAMBOCOR ) 50 MG tablet Take 1 tablet (50 mg total) by mouth 2 (two) times daily. Please advice patient to call our office to schedule overdue office visit for future refill. [phone]. Thank you and 1st attempt.   hydrochlorothiazide  (HYDRODIURIL ) 25 MG tablet Take 1 tablet (25 mg total) by mouth daily. FOR BLOOD PRESSURE/SWELLING   hydrocortisone  (ANUSOL -HC) 25 MG suppository Place 1 suppository (25 mg total) rectally at bedtime for 14 days.   ibuprofen  (ADVIL ,MOTRIN ) 800 MG tablet Take 1 tablet (800 mg total) by mouth 3 (three) times daily.   insulin  glargine, 2 Unit Dial , (TOUJEO  MAX SOLOSTAR) 300 UNIT/ML Solostar Pen Inject 36 Units into the skin daily and increase as directed in clinic   insulin  glargine, 2 Unit  Dial , (TOUJEO  MAX SOLOSTAR) 300 UNIT/ML Solostar Pen Inject 36 Units (up to 70 units) into the skin daily. Increase as directed in clinic.   insulin  glargine, 2 Unit Dial , (TOUJEO  MAX SOLOSTAR) 300 UNIT/ML Solostar Pen inject 46 units daily Subcutaneous (increase by 2 units every week if average 6am is >150 up to a max dose of 70 units)   insulin  lispro (HUMALOG  KWIKPEN) 200 UNIT/ML KwikPen Inject 10 Units into the skin 3 (three) times daily. (Patient taking differently: Inject 12 Units into the skin 3 (three) times daily.)   Insulin  Pen Needle 32G X 4 MM MISC as directed with Soliqua  daily 90 days   levocetirizine (XYZAL ) 5 MG tablet TAKE 1 TABLET BY MOUTH AT BEDTIME   meclizine  (ANTIVERT ) 25 MG tablet TAKE 1 TABLET ( 25MG ) BY MOUTH THREE TIME DAILY AS NEEDED FOR DIZZINESS   meloxicam  (MOBIC ) 15 MG  tablet Take 1 tablet by mouth once a day with food for inflammation   methocarbamol  (ROBAXIN ) 500 MG tablet Take 1-2 tablet by mouth every six to eight hours as needed FOR SPASMS/MUSCLE TENSION   mirabegron  ER (MYRBETRIQ ) 50 MG TB24 tablet Take 1 tablet (50 mg total) by mouth daily.   montelukast  (SINGULAIR ) 10 MG tablet Take 1 tablet (10 mg total) by mouth daily.   Multiple Vitamins-Minerals (ZINC PO) Take 1 tablet by mouth daily.   nebivolol  (BYSTOLIC ) 10 MG tablet Take 1 tablet (10 mg total) by mouth at bedtime.   Olopatadine-Mometasone (RYALTRIS ) 665-25 MCG/ACT SUSP 2 sprays each nostril up to twice a day for runny or stuffy nose control   ondansetron  (ZOFRAN -ODT) 4 MG disintegrating tablet Take 1 tablet (4 mg total) by mouth every 8 (eight) hours as needed for nausea for up to 7 days   oxyCODONE  (OXY IR/ROXICODONE ) 5 MG immediate release tablet Take 1 tablet (5 mg total) by mouth every 6 (six) hours as needed for pain   rosuvastatin  (CRESTOR ) 5 MG tablet Take 1 tablet (5 mg total) by mouth daily.   Spacer/Aero-Holding Chambers (AEROCHAMBER PLUS) inhaler Use as instructed   tirzepatide  (MOUNJARO ) 2.5 MG/0.5ML Pen Inject 2.5 mg into the skin once a week.     Family His   Family History  Problem Relation Age of Onset   Eczema Mother    Asthma Mother    Angioedema Mother    Allergic rhinitis Mother    Valvular heart disease Mother        Aortic valve replacement   Diabetes Mother    Angioedema Sister    Diabetes Sister    Breast cancer Sister        Half Sister   Eczema Brother    Asthma Brother    Allergic rhinitis Brother    Diabetes Brother    Asthma Maternal Aunt    Angioedema Maternal Aunt    Allergic rhinitis Maternal Aunt    Asthma Maternal Aunt    Angioedema Maternal Aunt    Angioedema Maternal Aunt    Allergic rhinitis Maternal Uncle    Allergic rhinitis Paternal Aunt    Breast cancer Paternal Aunt    Angioedema Paternal Uncle    Allergic rhinitis Paternal Uncle     Heart attack Maternal Grandmother        66s   Stroke Maternal Grandmother    Breast cancer Cousin 8   Urticaria Son    Allergic rhinitis Son    Eczema Son    Asthma Son    GI Specific Family History: Mother-colon polyps  Social History   Social History   Tobacco Use   Smoking status: Never   Smokeless tobacco: Never   Tobacco comments:    only smoked for one year al ong time ago   Vaping Use   Vaping status: Never Used  Substance Use Topics   Alcohol  use: Yes    Alcohol /week: 0.0 standard drinks of alcohol     Comment: occasional   Drug use: No   Samarie reports that she has never smoked. She has never used smokeless tobacco. She reports current alcohol  use. She reports that she does not use drugs.  Vital Signs and Physical Examination   Vitals:   05/05/24 1619  BP: 120/66  Pulse: 75  Height: 5' 2 (1.575 m)  Weight: 201 lb 4 oz (91.3 kg)  BMI (Calculated): 36.8     General: Well developed, well nourished, no acute distress Head: Normocephalic and atraumatic Eyes: Sclerae anicteric, EOMI Lungs: Clear throughout to auscultation Heart: Regular rate and rhythm; No murmurs, rubs or bruits Abdomen: Soft, non tender and non distended. No masses, hepatosplenomegaly or hernias noted. Normal Bowel sounds Rectal: Deferred Musculoskeletal: Symmetrical with no gross deformities     Review of Data   The following data was reviewed at the time of this encounter:   Laboratory Studies      Latest Ref Rng & Units 01/27/2024    4:15 PM 12/10/2023    2:43 PM 11/30/2023   12:08 PM  CBC  WBC 4.0 - 10.5 K/uL 7.6  6.8  8.5   Hemoglobin 12.0 - 15.0 g/dL 87.2  87.2  86.0   Hematocrit 36.0 - 46.0 % 39.7  39.5  44.1   Platelets 150.0 - 400.0 K/uL 360.0  391.0  332     Lab Results  Component Value Date   LIPASE 84 (H) 11/30/2023      Latest Ref Rng & Units 01/27/2024    4:15 PM 12/10/2023    2:43 PM 11/30/2023   12:08 PM  CMP  Glucose 70 - 99 mg/dL 832  682  756    BUN 6 - 23 mg/dL 11  8  13    Creatinine 0.40 - 1.20 mg/dL 8.88  9.19  9.14   Sodium 135 - 145 mEq/L 137  135  137   Potassium 3.5 - 5.1 mEq/L 3.5  3.4  3.4   Chloride 96 - 112 mEq/L 98  97  102   CO2 19 - 32 mEq/L 34  31  23   Calcium  8.4 - 10.5 mg/dL 9.4  9.6  9.1   Total Protein 6.0 - 8.3 g/dL 7.3  7.2  7.2   Total Bilirubin 0.2 - 1.2 mg/dL 0.4  0.5  0.9   Alkaline Phos 39 - 117 U/L 71  77  62   AST 0 - 37 U/L 20  28  22    ALT 0 - 35 U/L 26  37  21     Lab Results  Component Value Date   ESRSEDRATE 36 (H) 01/27/2024   CRP 2.3  11/2023 Celiac panel negative Stool panel positive for norovirus  Imaging Studies  CTAP 11/30/2023 1. Fluid-filled stomach and small bowel without evidence of obstruction or inflammation, possibly reflecting gastroenteritis. 2. No other acute findings or explanation for the patient's symptoms. The appendix appears normal. 3. Stable mild hepatic steatosis.   CTAP 09/26/2023 Chronic changes without acute abnormality.   CTAP 12/2021 No acute findings in the abdomen or pelvis.   GI Procedures and Studies  EGD/Colonoscopy 01/2024 EGD - a few diminutive sessile polyps in the gastric fundus and body Colonoscopy - normal colon and TI, IH Path: Fundic gland polyps, normal gastric mucosa without H. pylori, normal TI and colon biopsies  01/18/2022 colonoscopy with endoscopy with Dr. Elicia for rectal bleeding and abdominal discomfort good bowel prep showed hemorrhoids, normal TI, 4 mm polyp transverse colon 6 mm polyp sigmoid colon normal mucosa biopsies were taken, internal hemorrhoids small Pathology showed tubular adenomatous polyps and colonic mucosa negative for lymphocytic and collagenous colitis or dysplasia  Notes also state previous colonoscopy December 2020 small polyp sigmoid colon and internal hemorrhoids showed benign polypoid tissue repeat 5 years due to family history   Clinical Impression  It is my clinical impression that Ms. Barno is  a 48 y.o. female with;  GERD IBS-D History of adenomatous colon polyps Family history of colon polyps Hepatic steatosis  Ms. Ging returns to the office today to follow-up recent EGD and colonoscopy for symptoms of abdominal pain, bloating, diarrhea and GERD.  Her EGD and colonoscopy were reassuring and ruled out H. pylori infection, celiac disease, inflammatory bowel disease and microscopic colitis.  Her constellation of symptoms in the setting of otherwise normal findings appears to suggest a diagnosis of GERD and IBS-D.  Both of these conditions may be exacerbated by her use of Mounjaro .  For management of GERD I have advised dietary and lifestyle modification as well as continuing H2 blocker therapy.  In terms of her lower GI symptoms she is particularly bothered by bloating and fecal urgency.  Reviewed that these could be related to GLP-1 medication use.  She is already taking pre and probiotics.  Dietary modification is advised and I have suggested a trial of amine to see if this improves her fecal urgency.  We reviewed the results of her previous colonoscopy in 2023 which disclosed 2 tubular adenomas.  Most recent colonoscopy was normal without polyps.  She reports a family history of colon polyps.  I suggested a follow-up colonoscopy in 5 years given her family history of colon polyps and previous history of tubular adenomas.  Previous cross-sectional imaging showed evidence of mild hepatic steatosis.  Her liver enzymes have generally been normal with the exception of a mildly elevated ALT in spring 2025.  Given her metabolic risk factors of obesity, diabetes and HLD suspect she likely has a form of MASLD.  Fib 4 low risk for fibrosis.  Plan  Continue famotidine  20 mg p.o. twice daily GERD diet and lifestyle modification Continue pre and probiotics Trial of dicyclomine  20 mg p.o. 3 times daily as needed for equal urgency-have suggested scheduling this before meals Continue dietary  modification for IBS If bloating persist consider trial of Xifaxan  for IBS-D Next surveillance colonoscopy due 01/2029 Monitor LFTs every 6 to 12 months in the setting of suspected MASLD; ultrasound 3 years Monitor weight and anthropometrics   Planned Follow Up Return in about 4 months (around 09/04/2024).  The patient or caregiver verbalized understanding of the material covered, with no barriers to understanding. All questions were answered. Patient or caregiver is agreeable with the plan outlined above.    It was a pleasure to see Jacqualynn.  If you have any questions or concerns regarding this evaluation, do not hesitate to contact me.  Inocente Hausen, MD [facility]

## 2024-05-05 ENCOUNTER — Encounter: Payer: Self-pay | Admitting: Pediatrics

## 2024-05-05 ENCOUNTER — Other Ambulatory Visit (HOSPITAL_COMMUNITY): Payer: Self-pay

## 2024-05-05 ENCOUNTER — Ambulatory Visit: Admitting: Pediatrics

## 2024-05-05 VITALS — BP 120/66 | HR 75 | Ht 62.0 in | Wt 201.2 lb

## 2024-05-05 DIAGNOSIS — K219 Gastro-esophageal reflux disease without esophagitis: Secondary | ICD-10-CM

## 2024-05-05 DIAGNOSIS — Z83719 Family history of colon polyps, unspecified: Secondary | ICD-10-CM

## 2024-05-05 DIAGNOSIS — K760 Fatty (change of) liver, not elsewhere classified: Secondary | ICD-10-CM | POA: Diagnosis not present

## 2024-05-05 DIAGNOSIS — K580 Irritable bowel syndrome with diarrhea: Secondary | ICD-10-CM

## 2024-05-05 DIAGNOSIS — Z860101 Personal history of adenomatous and serrated colon polyps: Secondary | ICD-10-CM | POA: Diagnosis not present

## 2024-05-05 DIAGNOSIS — K589 Irritable bowel syndrome without diarrhea: Secondary | ICD-10-CM

## 2024-05-05 MED ORDER — DICYCLOMINE HCL 20 MG PO TABS
20.0000 mg | ORAL_TABLET | Freq: Three times a day (TID) | ORAL | 3 refills | Status: AC
  Filled 2024-05-05: qty 90, 30d supply, fill #0
  Filled 2024-05-17 – 2024-06-11 (×2): qty 90, 30d supply, fill #1
  Filled 2024-08-12: qty 90, 30d supply, fill #2
  Filled 2024-09-27: qty 90, 30d supply, fill #3

## 2024-05-05 NOTE — Patient Instructions (Addendum)
 _______________________________________________________  If your blood pressure at your visit was 140/90 or greater, please contact your primary care physician to follow up on this.  _______________________________________________________  If you are age 50 or older, your body mass index should be between 23-30. Your Body mass index is 36.81 kg/m. If this is out of the aforementioned range listed, please consider follow up with your Primary Care Provider.  If you are age 41 or younger, your body mass index should be between 19-25. Your Body mass index is 36.81 kg/m. If this is out of the aformentioned range listed, please consider follow up with your Primary Care Provider.   ________________________________________________________  The [facility] providers would like to encourage you to use MYCHART to communicate with providers for non-urgent requests or questions.  Due to long hold times on the telephone, sending your provider a message by [employer] may be a faster and more efficient way to get a response.  Please allow 48 business hours for a response.  Please remember that this is for non-urgent requests.  _______________________________________________________  [facility] is using a team-based approach to care.  Your team is made up of your doctor and two to three APPS. Our APPS (Nurse Practitioners and Physician Assistants) work with your physician to ensure care continuity for you. They are fully qualified to address your health concerns and develop a treatment plan. They communicate directly with your gastroenterologist to care for you. Seeing the Advanced Practice Practitioners on your physician's team can help you by facilitating care more promptly, often allowing for earlier appointments, access to diagnostic testing, procedures, and other specialty referrals.   We have sent the following medications to your pharmacy for you to pick up at your convenience:  START:  Dicyclomine  20mg  one tablet three times daily before meals.   Thank you for entrusting me with your care and choosing [facility].  Dr Suzann

## 2024-05-07 ENCOUNTER — Other Ambulatory Visit (HOSPITAL_COMMUNITY): Payer: Self-pay

## 2024-05-12 DIAGNOSIS — G5601 Carpal tunnel syndrome, right upper limb: Secondary | ICD-10-CM | POA: Diagnosis not present

## 2024-05-17 ENCOUNTER — Other Ambulatory Visit (HOSPITAL_COMMUNITY): Payer: Self-pay

## 2024-05-17 ENCOUNTER — Other Ambulatory Visit: Payer: Self-pay | Admitting: Internal Medicine

## 2024-05-17 DIAGNOSIS — I493 Ventricular premature depolarization: Secondary | ICD-10-CM

## 2024-05-17 DIAGNOSIS — S83411A Sprain of medial collateral ligament of right knee, initial encounter: Secondary | ICD-10-CM | POA: Diagnosis not present

## 2024-05-17 MED ORDER — ALPRAZOLAM 0.25 MG PO TABS
0.2500 mg | ORAL_TABLET | Freq: Two times a day (BID) | ORAL | 0 refills | Status: DC | PRN
  Filled 2024-05-17 – 2024-05-18 (×2): qty 60, 30d supply, fill #0

## 2024-05-18 ENCOUNTER — Other Ambulatory Visit (HOSPITAL_COMMUNITY): Payer: Self-pay

## 2024-05-18 ENCOUNTER — Other Ambulatory Visit: Payer: Self-pay

## 2024-05-18 ENCOUNTER — Encounter: Payer: Self-pay | Admitting: Urology

## 2024-05-19 ENCOUNTER — Other Ambulatory Visit: Payer: Self-pay

## 2024-05-19 DIAGNOSIS — M25461 Effusion, right knee: Secondary | ICD-10-CM | POA: Diagnosis not present

## 2024-05-19 DIAGNOSIS — S8391XA Sprain of unspecified site of right knee, initial encounter: Secondary | ICD-10-CM | POA: Diagnosis not present

## 2024-05-20 ENCOUNTER — Other Ambulatory Visit (HOSPITAL_COMMUNITY): Payer: Self-pay

## 2024-05-20 ENCOUNTER — Ambulatory Visit: Admitting: Urology

## 2024-05-20 DIAGNOSIS — M25561 Pain in right knee: Secondary | ICD-10-CM | POA: Diagnosis not present

## 2024-05-20 MED ORDER — FLECAINIDE ACETATE 50 MG PO TABS
50.0000 mg | ORAL_TABLET | Freq: Two times a day (BID) | ORAL | 0 refills | Status: DC
  Filled 2024-05-20: qty 30, 15d supply, fill #0

## 2024-05-26 ENCOUNTER — Other Ambulatory Visit (HOSPITAL_COMMUNITY): Payer: Self-pay

## 2024-05-26 DIAGNOSIS — M1711 Unilateral primary osteoarthritis, right knee: Secondary | ICD-10-CM | POA: Diagnosis not present

## 2024-05-26 DIAGNOSIS — I2510 Atherosclerotic heart disease of native coronary artery without angina pectoris: Secondary | ICD-10-CM | POA: Diagnosis not present

## 2024-05-26 DIAGNOSIS — Z794 Long term (current) use of insulin: Secondary | ICD-10-CM | POA: Diagnosis not present

## 2024-05-26 DIAGNOSIS — I10 Essential (primary) hypertension: Secondary | ICD-10-CM | POA: Diagnosis not present

## 2024-05-26 DIAGNOSIS — E1169 Type 2 diabetes mellitus with other specified complication: Secondary | ICD-10-CM | POA: Diagnosis not present

## 2024-05-26 MED ORDER — HUMULIN R U-500 KWIKPEN 500 UNIT/ML ~~LOC~~ SOPN
50.0000 [IU] | PEN_INJECTOR | Freq: Three times a day (TID) | SUBCUTANEOUS | 11 refills | Status: DC
  Filled 2024-05-26: qty 18, 60d supply, fill #0
  Filled 2024-06-11 – 2024-08-12 (×3): qty 18, 60d supply, fill #1

## 2024-06-01 DIAGNOSIS — G5601 Carpal tunnel syndrome, right upper limb: Secondary | ICD-10-CM | POA: Diagnosis not present

## 2024-06-02 DIAGNOSIS — R531 Weakness: Secondary | ICD-10-CM | POA: Diagnosis not present

## 2024-06-02 DIAGNOSIS — M25661 Stiffness of right knee, not elsewhere classified: Secondary | ICD-10-CM | POA: Diagnosis not present

## 2024-06-02 DIAGNOSIS — R2689 Other abnormalities of gait and mobility: Secondary | ICD-10-CM | POA: Diagnosis not present

## 2024-06-02 DIAGNOSIS — M25561 Pain in right knee: Secondary | ICD-10-CM | POA: Diagnosis not present

## 2024-06-08 DIAGNOSIS — M25661 Stiffness of right knee, not elsewhere classified: Secondary | ICD-10-CM | POA: Diagnosis not present

## 2024-06-08 DIAGNOSIS — R2689 Other abnormalities of gait and mobility: Secondary | ICD-10-CM | POA: Diagnosis not present

## 2024-06-08 DIAGNOSIS — R531 Weakness: Secondary | ICD-10-CM | POA: Diagnosis not present

## 2024-06-08 DIAGNOSIS — M25561 Pain in right knee: Secondary | ICD-10-CM | POA: Diagnosis not present

## 2024-06-11 ENCOUNTER — Other Ambulatory Visit (HOSPITAL_COMMUNITY): Payer: Self-pay

## 2024-06-11 ENCOUNTER — Other Ambulatory Visit: Payer: Self-pay

## 2024-06-11 ENCOUNTER — Other Ambulatory Visit: Payer: Self-pay | Admitting: Internal Medicine

## 2024-06-11 DIAGNOSIS — R531 Weakness: Secondary | ICD-10-CM | POA: Diagnosis not present

## 2024-06-11 DIAGNOSIS — I493 Ventricular premature depolarization: Secondary | ICD-10-CM

## 2024-06-11 DIAGNOSIS — M25561 Pain in right knee: Secondary | ICD-10-CM | POA: Diagnosis not present

## 2024-06-11 DIAGNOSIS — M25661 Stiffness of right knee, not elsewhere classified: Secondary | ICD-10-CM | POA: Diagnosis not present

## 2024-06-11 DIAGNOSIS — R2689 Other abnormalities of gait and mobility: Secondary | ICD-10-CM | POA: Diagnosis not present

## 2024-06-11 MED ORDER — ALPRAZOLAM 0.25 MG PO TABS
0.2500 mg | ORAL_TABLET | Freq: Two times a day (BID) | ORAL | 3 refills | Status: AC | PRN
  Filled 2024-06-17: qty 60, 30d supply, fill #0
  Filled 2024-08-20: qty 60, 30d supply, fill #1
  Filled 2024-09-03 – 2024-11-02 (×2): qty 60, 30d supply, fill #2

## 2024-06-11 MED ORDER — NEBIVOLOL HCL 10 MG PO TABS
10.0000 mg | ORAL_TABLET | Freq: Every day | ORAL | 3 refills | Status: AC
  Filled 2024-06-11: qty 90, 90d supply, fill #0
  Filled 2024-09-27: qty 90, 90d supply, fill #1

## 2024-06-11 MED ORDER — METHOCARBAMOL 500 MG PO TABS
500.0000 mg | ORAL_TABLET | Freq: Four times a day (QID) | ORAL | 2 refills | Status: DC | PRN
  Filled 2024-06-11 (×2): qty 60, 8d supply, fill #0

## 2024-06-11 NOTE — Telephone Encounter (Signed)
 Pt of Dr. Waddell. Passed 3rd attempt. Does Dr. Waddell want to refill? Please advise

## 2024-06-14 ENCOUNTER — Telehealth: Payer: Self-pay

## 2024-06-14 ENCOUNTER — Other Ambulatory Visit (HOSPITAL_COMMUNITY): Payer: Self-pay

## 2024-06-14 NOTE — Telephone Encounter (Signed)
 Called patient to set up an office appt for a pre-op clearance, no answer left a vm to call back.

## 2024-06-14 NOTE — Telephone Encounter (Signed)
   Name: Carolyn Phillips  DOB: 1976-05-27  MRN: [id number]  Primary Cardiologist: Danelle Birmingham, MD  Chart reviewed as part of pre-operative protocol coverage. Because of Carolyn Phillips's past medical history and time since last visit, she will require a follow-up in-office visit in order to better assess preoperative cardiovascular risk.  Pre-op covering staff: - Please schedule appointment and call patient to inform them. If patient already had an upcoming appointment within acceptable timeframe, please add pre-op clearance to the appointment notes so provider is aware. - Please contact requesting surgeon's office via preferred method (i.e, phone, fax) to inform them of need for appointment prior to surgery.  Jon Garre Vineta Carone, PA  06/14/2024, 3:47 PM

## 2024-06-14 NOTE — Telephone Encounter (Signed)
   Pre-operative Risk Assessment    Patient Name: Carolyn Phillips  DOB: 10-20-1975 MRN: [id number]   Date of last office visit: 02/06/23 DANELLE BIRMINGHAM, MD Date of next office visit: NONE   Request for Surgical Clearance    Procedure:  RIGHT ULNAR NEUROPLASTY AT THE ELBOW & ENDOSCOPIC CARPAL TUNNEL RELEASE  Date of Surgery:  Clearance 06/30/24                                Surgeon:  ALM HUMMER, MD Surgeon's Group or Practice Name:  [facility] Phone number:  [phone] Fax number:  [phone]   ATTN: VINA BONINE   Type of Clearance Requested:   - Medical    Type of Anesthesia:  MAC   Additional requests/questions:    SignedLucie DELENA Ku   06/14/2024, 3:20 PM \

## 2024-06-15 ENCOUNTER — Other Ambulatory Visit: Payer: Self-pay | Admitting: Orthopedic Surgery

## 2024-06-15 ENCOUNTER — Other Ambulatory Visit: Payer: Self-pay | Admitting: Internal Medicine

## 2024-06-15 ENCOUNTER — Other Ambulatory Visit (HOSPITAL_COMMUNITY): Payer: Self-pay

## 2024-06-15 DIAGNOSIS — M25661 Stiffness of right knee, not elsewhere classified: Secondary | ICD-10-CM | POA: Diagnosis not present

## 2024-06-15 DIAGNOSIS — I493 Ventricular premature depolarization: Secondary | ICD-10-CM

## 2024-06-15 DIAGNOSIS — R2689 Other abnormalities of gait and mobility: Secondary | ICD-10-CM | POA: Diagnosis not present

## 2024-06-15 DIAGNOSIS — M25561 Pain in right knee: Secondary | ICD-10-CM | POA: Diagnosis not present

## 2024-06-15 DIAGNOSIS — R531 Weakness: Secondary | ICD-10-CM | POA: Diagnosis not present

## 2024-06-15 NOTE — Telephone Encounter (Signed)
 2nd attempt, Called patient to set up an office appt for a pre-op clearance, no answer left a vm to call back.

## 2024-06-16 ENCOUNTER — Other Ambulatory Visit (HOSPITAL_COMMUNITY): Payer: Self-pay

## 2024-06-16 DIAGNOSIS — Z794 Long term (current) use of insulin: Secondary | ICD-10-CM | POA: Diagnosis not present

## 2024-06-16 DIAGNOSIS — G5601 Carpal tunnel syndrome, right upper limb: Secondary | ICD-10-CM | POA: Diagnosis not present

## 2024-06-16 DIAGNOSIS — I2510 Atherosclerotic heart disease of native coronary artery without angina pectoris: Secondary | ICD-10-CM | POA: Diagnosis not present

## 2024-06-16 DIAGNOSIS — Z01818 Encounter for other preprocedural examination: Secondary | ICD-10-CM | POA: Diagnosis not present

## 2024-06-16 DIAGNOSIS — E1169 Type 2 diabetes mellitus with other specified complication: Secondary | ICD-10-CM | POA: Diagnosis not present

## 2024-06-16 DIAGNOSIS — I10 Essential (primary) hypertension: Secondary | ICD-10-CM | POA: Diagnosis not present

## 2024-06-16 MED ORDER — FLECAINIDE ACETATE 50 MG PO TABS
50.0000 mg | ORAL_TABLET | Freq: Two times a day (BID) | ORAL | 0 refills | Status: DC
Start: 2024-06-16 — End: 2024-07-01
  Filled 2024-06-16: qty 30, 15d supply, fill #0

## 2024-06-16 NOTE — Progress Notes (Signed)
 Cardiology Office Note    Date:  06/19/2024  ID:  Marionette Meskill, DOB [birth year], MRN [id number] PCP:  Larnell Hamilton, MD  Cardiologist:  Danelle Birmingham, MD  Electrophysiologist:  None   Chief Complaint: Preoperative cardiac evaluation   History of Present Illness: .    Jakyria Levie Owensby is a 48 y.o. female with visit-pertinent history of Wolff-Parkinson-White s/p remote ablation, PVCs.  Patient with history of Wolff-Parkinson-White s/p ablation remotely.  She developed palpitations and was seen by Dr. Birmingham, patient with documented PVCs on Apple Watch.  She was started on flecainide  and symptoms improved.  Patient was last seen in clinic by Dr. Birmingham on 02/06/2023.  She remained stable from cardiac standpoint.  Today she presents for preoperative cardiac evaluation for right ulnar neuroplasty at the elbow and endoscopic carpal tunnel release with Dr. Alm Hummer.  Patient reports that she has been having problems with ongoing carpal tunnel pain and unfortunately also tore her ACL a few weeks ago while walking [place] the beach.  She notes that she accidentally reinjured her knee earlier this week.  Patient had been attending physical therapy and from a cardiac standpoint had tolerated well however continued to have knee pain and this was stopped.  She denies chest pain, shortness of breath, lower extremity edema, orthopnea or PND.  She does note some palpitations that she notes are her PVCs, does feel that in recent months this has gotten slightly worse, denies any associated symptoms, will plan for patient to follow-up with EP.  She is able to achieve greater than 4 METS of activity. ROS: .   Today she denies chest pain, shortness of breath, lower extremity edema, fatigue, melena, hematuria, hemoptysis, diaphoresis, weakness, presyncope, syncope, orthopnea, and PND.  All other systems are reviewed and otherwise negative. Studies Reviewed: SABRA   EKG:  EKG is ordered today,  personally reviewed, demonstrating  EKG Interpretation Date/Time:  Friday June 18 2024 13:49:59 EDT Ventricular Rate:  91 PR Interval:  176 QRS Duration:  84 QT Interval:  354 QTC Calculation: 435 R Axis:   58  Text Interpretation: Sinus rhythm with occasional Premature ventricular complexes Nonspecific T wave abnormality Confirmed by Armstrong Creasy [phone]) on 06/18/2024 9:27:05 PM   CV Studies: Cardiac studies reviewed are outlined and summarized above. Otherwise please see EMR for full report. Cardiac Studies & Procedures   ______________________________________________________________________________________________   STRESS TESTS  NM MYOCAR MULTI W/SPECT W 01/11/2016  Narrative CLINICAL DATA:  Chest pain. History of diabetes, hypertension and SVT.  EXAM: MYOCARDIAL IMAGING WITH SPECT (REST AND PHARMACOLOGIC-STRESS)  GATED LEFT VENTRICULAR WALL MOTION STUDY  LEFT VENTRICULAR EJECTION FRACTION  TECHNIQUE: Standard myocardial SPECT imaging was performed after resting intravenous injection of 10 mCi Tc-53m sestamibi. Subsequently, intravenous infusion of Lexiscan  was performed under the supervision of the Cardiology staff. At peak effect of the drug, 30 mCi Tc-52m sestamibi was injected intravenously and standard myocardial SPECT imaging was performed. Quantitative gated imaging was also performed to evaluate left ventricular wall motion, and estimate left ventricular ejection fraction.  COMPARISON:  Chest radiographs 01/10/2016.  FINDINGS: Perfusion: No decreased activity in the left ventricle on stress imaging to suggest reversible ischemia or infarction. There is mildly variable activity within the anterior wall, attributed to breast attenuation.  Wall Motion: Normal left ventricular wall motion. No left ventricular dilation.  Left Ventricular Ejection Fraction: 65 %  End diastolic volume 79 ml  End systolic volume 28 ml  IMPRESSION: 1. No reversible  ischemia or infarction.  2. Normal left ventricular wall motion.  3. Left ventricular ejection fraction 65%  4. Low-risk stress test findings*.  *2012 Appropriate Use Criteria for Coronary Revascularization Focused Update: J Am Coll Cardiol. 2012;59(9):857-881. http://content.dementiazones.com.aspx?articleid=1201161   Electronically Signed By: Elsie Perone M.D. On: 01/11/2016 15:41   ECHOCARDIOGRAM  ECHOCARDIOGRAM COMPLETE 09/11/2018  Narrative * [facility]* [address] [place], [place] [address] [id number]  ------------------------------------------------------------------- Transthoracic Echocardiography  Patient:    Collyns, Mcquigg MR #:       [id number] Study Date: 09/11/2018 Gender:     F Age:        42 Height:     157.5 cm Weight:     94.4 kg BSA:        2.08 m^2 Pt. Status: Room:  SONOGRAPHER  [facility], [place] [address], [place] [address] [place] [id number] PERFORMING   [facility], Outpatient  cc:  ------------------------------------------------------------------- LV EF: 65% -   70%  ------------------------------------------------------------------- Indications:      (I25.10).  ------------------------------------------------------------------- History:   PMH:  Bilateral lower extremity edema. WPW syndrome (s/p ablation). Acquired from the patient and from the patient&'s chart. Coronary artery disease.  Coronary artery disease.  Risk factors: Hypertension. Diabetes mellitus. Obese. Dyslipidemia.  ------------------------------------------------------------------- Study Conclusions  - Left ventricle: The cavity size was normal. Wall thickness was increased in a pattern of mild LVH. Systolic function was vigorous. The estimated ejection fraction was in the range of 65% to 70%. Wall motion was normal; there were no regional wall motion abnormalities. Left ventricular  diastolic function parameters were normal.  ------------------------------------------------------------------- Study data:  Comparison was made to the study of 01/11/2016.  Study status:  Routine.  Procedure:  The patient reported no pain pre or post test. Transthoracic echocardiography for left ventricular function evaluation and for assessment of valvular function. Image quality was adequate.  Study completion:  There were no complications.          Transthoracic echocardiography.  M-mode, complete 2D, spectral Doppler, and color Doppler.  Birthdate: Patient birthdate: [birth year].  Age:  Patient is 48 yr old.  Sex: Gender: female.    BMI: 38.1 kg/m^2.  Blood pressure:     124/66 Patient status:  Outpatient.  Study date:  Study date: 09/11/2018. Study time: 09:28 AM.  Location:  [facility]  -------------------------------------------------------------------  ------------------------------------------------------------------- Left ventricle:  The cavity size was normal. Wall thickness was increased in a pattern of mild LVH. Systolic function was vigorous. The estimated ejection fraction was in the range of 65% to 70%. Wall motion was normal; there were no regional wall motion abnormalities. Left ventricular diastolic function parameters were normal.  ------------------------------------------------------------------- Aortic valve:   Trileaflet; normal thickness leaflets. Mobility was not restricted.  Doppler:  Transvalvular velocity was within the normal range. There was no stenosis. There was no regurgitation.  ------------------------------------------------------------------- Aorta:  Aortic root: The aortic root was normal in size.  ------------------------------------------------------------------- Mitral valve:   Structurally normal valve.   Mobility was not restricted.  Doppler:  Transvalvular velocity was within the normal range. There was no evidence for stenosis.  There was no regurgitation.    Valve area by pressure half-time: 3.44 cm^2. Indexed valve area by pressure half-time: 1.65 cm^2/m^2.    Peak gradient (D): 4 mm Hg.  ------------------------------------------------------------------- Left atrium:  The atrium was normal in size.  ------------------------------------------------------------------- Right ventricle:  The cavity size was normal. Wall thickness was normal.  Systolic function was normal.  ------------------------------------------------------------------- Pulmonic valve:    Doppler:  Transvalvular velocity was within the normal range. There was no evidence for stenosis.  ------------------------------------------------------------------- Tricuspid valve:   Structurally normal valve.    Doppler: Transvalvular velocity was within the normal range. There was no regurgitation.  ------------------------------------------------------------------- Pulmonary artery:   The main pulmonary artery was normal-sized. Systolic pressure was within the normal range.  ------------------------------------------------------------------- Right atrium:  The atrium was normal in size.  ------------------------------------------------------------------- Pericardium:  There was no pericardial effusion.  ------------------------------------------------------------------- Systemic veins: Inferior vena cava: The vessel was normal in size.  ------------------------------------------------------------------- Measurements  Left ventricle                         Value          Reference LV ID, ED, PLAX chordal        (L)     40    mm       43 - 52 LV ID, ES, PLAX chordal                28    mm       23 - 38 LV fx shortening, PLAX chordal         30    %        >=29 LV PW thickness, ED                    11    mm       ---------- IVS/LV PW ratio, ED                    1.09           <=1.3 Stroke volume, 2D                      54    ml        ---------- Stroke volume/bsa, 2D                  26    ml/m^2   ---------- LV e&', lateral                         11.1  cm/s     ---------- LV E/e&', lateral                       9.28           ---------- LV e&', medial                          7.29  cm/s     ---------- LV E/e&', medial                        14.13          ---------- LV e&', average                         9.2   cm/s     ---------- LV E/e&', average                       11.2           ----------  Ventricular septum  Value          Reference IVS thickness, ED                      12    mm       ----------  LVOT                                   Value          Reference LVOT ID, S                             17    mm       ---------- LVOT area                              2.27  cm^2     ---------- LVOT ID                                17    mm       ---------- LVOT peak velocity, S                  126   cm/s     ---------- LVOT mean velocity, S                  83.2  cm/s     ---------- LVOT VTI, S                            23.8  cm       ---------- LVOT peak gradient, S                  6     mm Hg    ---------- Stroke volume (SV), LVOT DP            54    ml       ---------- Stroke index (SV/bsa), LVOT DP         26    ml/m^2   ----------  Aorta                                  Value          Reference Aortic root ID, ED                     29    mm       ---------- Ascending aorta ID, A-P, S             29    mm       ----------  Left atrium                            Value          Reference LA ID, A-P, ES                         34    mm       ---------- LA ID/bsa, A-P  1.63  cm/m^2   <=2.2 LA volume, S                           45.9  ml       ---------- LA volume/bsa, S                       22.1  ml/m^2   ---------- LA volume, ES, 1-p A4C                 42    ml       ---------- LA volume/bsa, ES, 1-p A4C             20.2  ml/m^2   ---------- LA volume, ES, 1-p  A2C                 47.2  ml       ---------- LA volume/bsa, ES, 1-p A2C             22.7  ml/m^2   ----------  Mitral valve                           Value          Reference Mitral E-wave peak velocity            103   cm/s     ---------- Mitral A-wave peak velocity            85.7  cm/s     ---------- Mitral deceleration time               219   ms       150 - 230 Mitral pressure half-time              64    ms       ---------- Mitral peak gradient, D                4     mm Hg    ---------- Mitral E/A ratio, peak                 1.2            ---------- Mitral valve area, PHT, DP             3.44  cm^2     ---------- Mitral valve area/bsa, PHT, DP         1.65  cm^2/m^2 ----------  Right atrium                           Value          Reference RA ID, S-I, ES, A4C                    45.1  mm       34 - 49 RA area, ES, A4C                       10.8  cm^2     8.3 - 19.5 RA volume, ES, A/L                     21.7  ml       ---------- RA volume/bsa, ES, A/L  10.4  ml/m^2   ----------  Systemic veins                         Value          Reference Estimated CVP                          3     mm Hg    ----------  Right ventricle                        Value          Reference TAPSE                                  22.4  mm       ---------- RV s&', lateral, S                      14.4  cm/s     ----------  Legend: (L)  and  (H)  mark values outside specified reference range.  ------------------------------------------------------------------- Prepared and Electronically Authenticated by  Oneil Parchment, M.D. 2019-12-13T11:33:47          ______________________________________________________________________________________________       Current Reported Medications:.    Current Meds  Medication Sig   acetaminophen  (TYLENOL ) 500 MG tablet Take 1,000 mg by mouth every 6 (six) hours as needed for mild pain (pain score 1-3).   albuterol  (VENTOLIN  HFA) 108 (90  Base) MCG/ACT inhaler Inhale 2 puffs into the lungs every 4 to 6 hours as needed for shortness of breath or wheezing.   ALPRAZolam  (XANAX ) 0.25 MG tablet Take 1 tablet (0.25 mg total) by mouth 2 (two) times daily as needed.   cephALEXin  (KEFLEX ) 250 MG capsule Take 1 capsule (250 mg total) by mouth at bedtime.   Cholecalciferol (VITAMIN D3) 125 MCG (5000 UT) CAPS Take 5,000 Units by mouth daily.    Continuous Glucose Sensor (FREESTYLE LIBRE 3 PLUS SENSOR) MISC Apply one sensor every 15 days to continuously monitor blood glucose.   Continuous Glucose Sensor (FREESTYLE LIBRE 3 SENSOR) MISC Change sensor topically every 14 (fourteen) days to monitor blood glucose continuously   dicyclomine  (BENTYL ) 20 MG tablet Take 1 tablet (20 mg total) by mouth 3 (three) times daily before meals.   estradiol  (DOTTI ) 0.075 MG/24HR Place 1 patch onto the skin 2 (two) times a week.   estradiol  (ESTRACE ) 0.1 MG/GM vaginal cream Apply 3 times weekly as directed using a pea-sized amount on fingertip.   famotidine  (PEPCID ) 20 MG tablet Take 1 tablet (20 mg total) by mouth 2 (two) times daily.   flecainide  (TAMBOCOR ) 50 MG tablet Take 1 tablet (50 mg total) by mouth 2 (two) times daily.   fluconazole  (DIFLUCAN ) 100 MG tablet Take 1 tablet (100 mg total) by mouth daily for 7 days   hydrochlorothiazide  (HYDRODIURIL ) 25 MG tablet Take 1 tablet (25 mg total) by mouth daily. FOR BLOOD PRESSURE/SWELLING   hydrocortisone  (ANUSOL -HC) 25 MG suppository Place 1 suppository (25 mg total) rectally at bedtime for 14 days.   ibuprofen  (ADVIL ,MOTRIN ) 800 MG tablet Take 1 tablet (800 mg total) by mouth 3 (three) times daily.   Insulin  Pen Needle 32G X 4 MM MISC as directed with Sol[facility]a  daily 90 days   insulin  regular human CONCENTRATED (HUMULIN  R U-500  KWIKPEN) 500 UNIT/ML KwikPen Inject 50 Units into the skin 3 (three) times daily.   levocetirizine (XYZAL ) 5 MG tablet TAKE 1 TABLET BY MOUTH AT BEDTIME   meclizine  (ANTIVERT ) 25 MG tablet  TAKE 1 TABLET ( 25MG ) BY MOUTH THREE TIME DAILY AS NEEDED FOR DIZZINESS   meloxicam  (MOBIC ) 15 MG tablet Take 1 tablet by mouth once a day with food for inflammation   methocarbamol  (ROBAXIN ) 500 MG tablet Take 1-2 tablet by mouth every six to eight hours as needed FOR SPASMS/MUSCLE TENSION   methocarbamol  (ROBAXIN ) 500 MG tablet Take 1-2 tablets (500-1,000 mg total) by mouth every 6 to 8 hours as needed for spasms/muscle tension.   mirabegron  ER (MYRBETRIQ ) 50 MG TB24 tablet Take 1 tablet (50 mg total) by mouth daily.   montelukast  (SINGULAIR ) 10 MG tablet Take 1 tablet (10 mg total) by mouth daily.   nebivolol  (BYSTOLIC ) 10 MG tablet Take 1 tablet (10 mg total) by mouth at bedtime.   Olopatadine-Mometasone (RYALTRIS ) 665-25 MCG/ACT SUSP 2 sprays each nostril up to twice a day for runny or stuffy nose control   ondansetron  (ZOFRAN -ODT) 4 MG disintegrating tablet Take 1 tablet (4 mg total) by mouth every 8 (eight) hours as needed for nausea for up to 7 days   oxyCODONE  (OXY IR/ROXICODONE ) 5 MG immediate release tablet Take 1 tablet (5 mg total) by mouth every 6 (six) hours as needed for pain   rosuvastatin  (CRESTOR ) 5 MG tablet Take 1 tablet (5 mg total) by mouth daily.   Spacer/Aero-Holding Chambers (AEROCHAMBER PLUS) inhaler Use as instructed   tirzepatide  (MOUNJARO ) 2.5 MG/0.5ML Pen Inject 2.5 mg into the skin once a week.   Physical Exam:    VS:  BP 132/82   Pulse 91   Ht 5' 2 (1.575 m)   Wt 196 lb 9.6 oz (89.2 kg)   SpO2 96%   BMI 35.96 kg/m    Wt Readings from Last 3 Encounters:  06/18/24 196 lb 9.6 oz (89.2 kg)  06/17/24 195 lb (88.5 kg)  05/05/24 201 lb 4 oz (91.3 kg)    GEN: Well nourished, well developed in no acute distress NECK: No JVD; No carotid bruits CARDIAC: RRR, no murmurs, rubs, gallops RESPIRATORY:  Clear to auscultation without rales, wheezing or rhonchi  ABDOMEN: Soft, non-tender, non-distended EXTREMITIES:  No edema; No acute deformity     Asessement and  Plan:.    Preoperative cardiac evaluation: Patient is currently pending a right ulnar neuroplasty at the elbow and endoscopic carpal tunnel release with Dr. Alm Hummer on 06/30/2024, she is also pending ACL surgery. Ms. Dark's perioperative risk of a major cardiac event is 0.9% according to the Revised Cardiac Risk Index (RCRI).  Therefore, she is at low risk for perioperative complications.   Her functional capacity is good at 7.25 METs according to the [facility] Activity Status Index (DASI). Recommendations: According to ACC/AHA guidelines, no further cardiovascular testing needed.  The patient may proceed to surgery at acceptable risk.  Antiplatelet and/or Anticoagulation Recommendations: None requested.   PVCs: Patient with history of frequent PVCs, on low-dose flecainide  and Nebivolol .  Patient does note a slight increase in PVC burden, will have patient have close follow-up with EP regarding ongoing management.  WPW: s/p remote ablation.  Patient denies any symptoms of sustained palpitations, notes PVCs as noted above.  HTN: Blood pressure today 132/82.  Continue current antihypertensive regimen.   Disposition: F/u with Dr. Waddell at next available appointment.   Signed, Fynn Adel D Kaziyah Parkison,  NP

## 2024-06-16 NOTE — Telephone Encounter (Signed)
 Called patient to schedule cardiac clearance telehealth appointment. NA, left message  on VM.

## 2024-06-17 ENCOUNTER — Other Ambulatory Visit: Payer: Self-pay

## 2024-06-17 ENCOUNTER — Ambulatory Visit (INDEPENDENT_AMBULATORY_CARE_PROVIDER_SITE_OTHER): Admitting: Urology

## 2024-06-17 ENCOUNTER — Other Ambulatory Visit (HOSPITAL_COMMUNITY): Payer: Self-pay

## 2024-06-17 ENCOUNTER — Encounter: Payer: Self-pay | Admitting: Urology

## 2024-06-17 VITALS — BP 141/90 | HR 88 | Ht 62.0 in | Wt 195.0 lb

## 2024-06-17 DIAGNOSIS — R2689 Other abnormalities of gait and mobility: Secondary | ICD-10-CM | POA: Diagnosis not present

## 2024-06-17 DIAGNOSIS — N3281 Overactive bladder: Secondary | ICD-10-CM | POA: Diagnosis not present

## 2024-06-17 DIAGNOSIS — B3749 Other urogenital candidiasis: Secondary | ICD-10-CM

## 2024-06-17 DIAGNOSIS — N390 Urinary tract infection, site not specified: Secondary | ICD-10-CM

## 2024-06-17 DIAGNOSIS — M25661 Stiffness of right knee, not elsewhere classified: Secondary | ICD-10-CM | POA: Diagnosis not present

## 2024-06-17 DIAGNOSIS — M25561 Pain in right knee: Secondary | ICD-10-CM | POA: Diagnosis not present

## 2024-06-17 DIAGNOSIS — R531 Weakness: Secondary | ICD-10-CM | POA: Diagnosis not present

## 2024-06-17 LAB — URINALYSIS, ROUTINE W REFLEX MICROSCOPIC
Bilirubin, UA: NEGATIVE
Leukocytes,UA: NEGATIVE
Nitrite, UA: NEGATIVE
Specific Gravity, UA: 1.02 (ref 1.005–1.030)
Urobilinogen, Ur: 0.2 mg/dL (ref 0.2–1.0)
pH, UA: 6 (ref 5.0–7.5)

## 2024-06-17 LAB — MICROSCOPIC EXAMINATION

## 2024-06-17 MED ORDER — FLUCONAZOLE 100 MG PO TABS
100.0000 mg | ORAL_TABLET | Freq: Every day | ORAL | 0 refills | Status: AC
Start: 2024-06-17 — End: ?
  Filled 2024-06-17: qty 7, 7d supply, fill #0

## 2024-06-17 NOTE — Progress Notes (Signed)
 Assessment: 1. OAB (overactive bladder)   2. Recurrent UTI   3. Candiduria     Plan: No evidence of UTI on urinalysis today.  She does have some microscopic hematuria.  She has previously been evaluated with CT imaging and cystoscopy. Continue methods to reduce the risk of UTIs including increase fluid intake, timed and double voiding, daily cranberry supplementation, daily probiotic, vaginal estrogen cream, and antibiotic suppression with Keflex . Continue daily antibiotic. Continue Myrbetriq  50 mg daily.  Recommend that she try taking this in the morning to see if this improves her dry mouth. Also Gynazole-1 100 mg daily x 7 days for candiduria. Return to office in 3 months.  Chief Complaint: Chief Complaint  Patient presents with   Over Active Bladder    HPI: Carolyn Phillips is a 48 y.o. female who presents for continued evaluation of recurrent UTIs and overactive bladder. She has been previously evaluated by Dr. Shona and was last seen in February 2025.  History: Past medical history is remarkable for WPW ablation 2015, hypertension, hyperlipidemia, diabetes. Prior GU history of remarkable for abdominal hysterectomy and bladder repair in 2010.  She underwent an open Burch procedure for genuine stress incontinence in [place]  in 2011. Patient with history of multiple intermittent episodes compatible with simple cystitis  treated with multiple courses of antibiotics over the last 6 months.  These episodes are without culture documentation.  She also reported frequency, urgency, and occasional urge incontinence.  Her urinary symptoms have been progressing over the last few years. She presented 09/13/2023 with flank discomfort and significant dysuria and frequency.  She was noted to have significant microscopic hematuria and pyuria.  Urine culture grew mixed flora.  Patient was treated empirically with Omnicef  300 twice daily for 7 days.   CT A/P 09/26/2023--no acute  findings. Adrenals/Urinary Tract: Adrenal glands are within normal limits. Kidneys demonstrate a normal enhancement pattern bilaterally. No renal calculi or obstructive changes are seen. Normal excretion is noted on delayed images. Bladder is decompressed. Reproductive: Status post hysterectomy. No adnexal masses.  Urine culture from 2/25 grew >100 K Klebsiella.  She was previously on daily suppression with low-dose Bactrim . She was changed to Keflex  250 mg daily in February 2025. Cystoscopy from 2/25 showed no urethral or bladder abnormalities. She was on Vesicare  for her OAB symptoms.  This was discontinued in February 2025 due to some difficulty voiding.  At her visit in May 2025, she continued on daily cephalexin  for UTI prevention.  She was also using vaginal hormone cream.  No UTI symptoms.  No dysuria or gross hematuria.  She continued to have symptoms of frequency, urgency, and urge incontinence. Urine culture grew 50-100 K lactobacillus.  No treatment recommended. She was given a trial of Gemtesa  75 mg daily.  This medication works very well for her.  Unfortunately, insurance would not cover the Gemtesa  unless she tried other medications. She was provided a prescription for Myrbetriq  50 mg daily in July 2025.  She returns today for follow-up.  She continues on Myrbetriq  50 mg daily.  This is controlling her bladder symptoms well.  She does report some dry mouth.  She is currently taking the medication at night.  She is also on Bentyl  3 times a day.  She has recently noted some cloudy urine.  No dysuria or gross hematuria.  She continues on daily cephalexin  and vaginal hormone cream.  Portions of the above documentation were copied from a prior visit for review purposes only.  Allergies: Allergies  Allergen Reactions   Penicillins Hives and Swelling    Has patient had a PCN reaction causing immediate rash, facial/tongue/throat swelling, SOB or lightheadedness with hypotension:  Yes Has patient had a PCN reaction causing severe rash involving mucus membranes or skin necrosis: No Has patient had a PCN reaction that required hospitalization No Has patient had a PCN reaction occurring within the last 10 years: No If all of the above answers are NO, then may proceed with Cephalosporin use.   Eye swelling   Other Nausea And Vomiting    General Anesthesia    Metoprolol Other (See Comments)    LOL Drugs cause hair to fall out   Nitrofurantoin Macrocrystal Other (See Comments)    Pt unsure of reaction     PMH: Past Medical History:  Diagnosis Date   Angio-edema    Asthma    Beta thalassemia trait    Complication of anesthesia    Diabetes mellitus without complication (HCC)    Dysrhythmia    WPW-ablation   Eczema    GERD (gastroesophageal reflux disease)    Headache    Hyperlipemia    Hypertension    Obesity    PONV (postoperative nausea and vomiting)    Recurrent upper respiratory infection (URI)    Urticaria    Wolff-Parkinson-White syndrome     PSH: Past Surgical History:  Procedure Laterality Date   ABDOMINAL HYSTERECTOMY     BLADDER REPAIR     BREAST REDUCTION SURGERY     CHOLECYSTECTOMY N/A 10/25/2021   Procedure: LAPAROSCOPIC CHOLECYSTECTOMY WITH INTRAOPERATIVE CHOLANGIOGRAM;  Surgeon: Belinda Cough, MD;  Location: [facility] OR;  Service: General;  Laterality: N/A;   CHONDROPLASTY Left 09/13/2015   Procedure: CHONDROPLASTY;  Surgeon: Dempsey Sensor, MD;  Location: [facility];  Service: Orthopedics;  Laterality: Left;   KNEE ARTHROSCOPY WITH DRILLING/MICROFRACTURE Left 09/13/2015   Procedure: KNEE ARTHROSCOPY WITH DRILLING/MICROFRACTURE;  Surgeon: Dempsey Sensor, MD;  Location: [facility];  Service: Orthopedics;  Laterality: Left;   REDUCTION MAMMAPLASTY Bilateral 2001   SUPRAVENTRICULAR TACHYCARDIA ABLATION N/A 07/04/2014   Procedure: WPW ABLATION;  Surgeon: Danelle LELON Birmingham, MD;  Location: [facility] CATH LAB;  Service:  Cardiovascular;  Laterality: N/A;   TUBAL LIGATION      SH: Social History   Tobacco Use   Smoking status: Never   Smokeless tobacco: Never   Tobacco comments:    only smoked for one year al ong time ago   Vaping Use   Vaping status: Never Used  Substance Use Topics   Alcohol  use: Yes    Alcohol /week: 0.0 standard drinks of alcohol     Comment: occasional   Drug use: No    ROS: Constitutional:  Negative for fever, chills, weight loss CV: Negative for chest pain, previous MI, hypertension Respiratory:  Negative for shortness of breath, wheezing, sleep apnea, frequent cough GI:  Negative for nausea, vomiting, bloody stool, GERD  PE: BP (!) 141/90   Pulse 88   Ht 5' 2 (1.575 m)   Wt 195 lb (88.5 kg)   BMI 35.67 kg/m  GENERAL APPEARANCE:  Well appearing, well developed, well nourished, NAD HEENT:  Atraumatic, normocephalic, oropharynx clear NECK:  Supple without lymphadenopathy or thyromegaly ABDOMEN:  Soft, non-tender, no masses EXTREMITIES:  Moves all extremities well, without clubbing, cyanosis, or edema NEUROLOGIC:  Alert and oriented x 3, normal gait, CN II-XII grossly intact MENTAL STATUS:  appropriate BACK:  Non-tender to palpation, No CVAT SKIN:  Warm, dry, and intact  Results: U/A: 0-5 WBC, 3-10 RBC, + yeast

## 2024-06-18 ENCOUNTER — Other Ambulatory Visit: Payer: Self-pay

## 2024-06-18 ENCOUNTER — Encounter: Payer: Self-pay | Admitting: Cardiology

## 2024-06-18 ENCOUNTER — Other Ambulatory Visit (HOSPITAL_COMMUNITY): Payer: Self-pay

## 2024-06-18 ENCOUNTER — Ambulatory Visit: Attending: Cardiology | Admitting: Cardiology

## 2024-06-18 VITALS — BP 132/82 | HR 91 | Ht 62.0 in | Wt 196.6 lb

## 2024-06-18 DIAGNOSIS — I10 Essential (primary) hypertension: Secondary | ICD-10-CM

## 2024-06-18 DIAGNOSIS — I493 Ventricular premature depolarization: Secondary | ICD-10-CM | POA: Diagnosis not present

## 2024-06-18 DIAGNOSIS — I456 Pre-excitation syndrome: Secondary | ICD-10-CM

## 2024-06-18 DIAGNOSIS — Z01810 Encounter for preprocedural cardiovascular examination: Secondary | ICD-10-CM | POA: Diagnosis not present

## 2024-06-18 NOTE — Telephone Encounter (Signed)
 Appointment scheduled 06/18/2024 with Vannie, NP

## 2024-06-18 NOTE — Patient Instructions (Addendum)
 Medication Instructions:    Your physician recommends that you continue on your current medications as directed. Please refer to the Current Medication list given to you today.   *If you need a refill on your cardiac medications before your next appointment, please call your pharmacy*    Lab Work: .NONE ORDERED  TODAY     If you have labs (blood work) drawn today and your tests are completely normal, you will receive your results only by: MyChart Message (if you have MyChart) OR A paper copy in the mail If you have any lab test that is abnormal or we need to change your treatment, we will call you to review the results.    Testing/Procedures: NONE ORDERED  TODAY     Follow-Up: At [facility], you and your health needs are our priority.  As part of our continuing mission to provide you with exceptional heart care, our providers are all part of one team.  This team includes your primary Cardiologist (physician) and Advanced Practice Providers or APPs (Physician Assistants and Nurse Practitioners) who all work together to provide you with the care you need, when you need it.  Your next appointment:  next available with Dr. Waddell  /App  as scheduled      We recommend signing up for the patient portal called MyChart.  Sign up information is provided on this After Visit Summary.  MyChart is used to connect with patients for Virtual Visits (Telemedicine).  Patients are able to view lab/test results, encounter notes, upcoming appointments, etc.  Non-urgent messages can be sent to your provider as well.   To learn more about what you can do with MyChart, go to [email or web address].   Other Instructions

## 2024-06-19 ENCOUNTER — Encounter: Payer: Self-pay | Admitting: Cardiology

## 2024-06-20 NOTE — Progress Notes (Unsigned)
  Electrophysiology Office Note:   Date:  06/22/2024  ID:  Carolyn Phillips, DOB 03/24/1976, MRN [id number]  Primary Cardiologist: None Primary Heart Failure: None Electrophysiologist: Danelle Birmingham, MD      History of Present Illness:   Carolyn Phillips is a 48 y.o. female with h/o WPW s/p remote ablation, PVC's, HTN, HLD, OSA, GERD, DM  seen today for routine electrophysiology followup.   She was seen 06/18/24 by Cardiology for pre-operative cardiac evaluation for R ulnar neuroplasty at the elbow and endoscopic carpal tunnel release and was found to be low risk for peri-operative complications.  She was noted at that time to have a slight increase in PVC burden.     Since last being seen in our clinic the patient reports doing she has had a lot going on in life recently. She built a house, moved, is taking care of her mother who has dementia, working full time and has had a lot of health issues herself.  She feels she has PVC's all the time.  She describes it as a pinch and pull in her chest.  She states she hopes her WPW is not an issue as she knows her ablation was very close to her AV node.  She is pending orthopedic surgery.     She denies chest pain, palpitations, dyspnea, PND, orthopnea, nausea, vomiting, dizziness, syncope, edema, weight gain, or early satiety.   Review of systems complete and found to be negative unless listed in HPI.   EP Information / Studies Reviewed:    EKG is not ordered today. EKG from 06/18/24 reviewed which showed SR with occasional PVC's 91 bpm       Arrhythmia / AAD / Pertinent EP Studies WPW s/p ablation 07/04/14 PVC's  Flecainide  + Nebivolol    Risk Assessment/Calculations:              Physical Exam:   VS:  BP 115/76   Pulse 72   Ht 5' 2 (1.575 m)   Wt 193 lb (87.5 kg)   SpO2 97%   BMI 35.30 kg/m    Wt Readings from Last 3 Encounters:  06/22/24 193 lb (87.5 kg)  06/18/24 196 lb 9.6 oz (89.2 kg)  06/17/24 195 lb (88.5  kg)     GEN: Well nourished, well developed in no acute distress NECK: No JVD; No carotid bruits CARDIAC: Regular rate and rhythm, no murmurs, rubs, gallops RESPIRATORY:  Clear to auscultation without rales, wheezing or rhonchi  ABDOMEN: Soft, non-tender, non-distended EXTREMITIES:  No edema; No deformity   ASSESSMENT AND PLAN:    PVC's  High Risk Medication Monitoring: Flecainide   LVEF 65-70% in 2019  -EKG 06/18/24 with NSR, occ PVC, stable intervals  -continue flecainide  50 mg BID  -continue nebivolol  10mg  at bedtime -assess monitor & update ECHO given increased symptom burden   -hold med changes for now, may need to increase above if symptoms correlate with arrhythmia/PVC's  WPW s/p Ablation  -no pre-excitation on recent EKG, no prolonged fast rates    Hypertension  -well controlled on current regimen    Pre-Operative Cardiac Clearance  -completed by Katlyn West, NP on 06/18/24   Follow up with Dr. Birmingham / EP APP in 12 months, sooner pending results of above testing.  Timing to be determined based on results.   Signed, Daphne Barrack, NP-C, AGACNP-BC [facility] - Electrophysiology  06/22/2024, 12:36 PM

## 2024-06-22 ENCOUNTER — Encounter: Payer: Self-pay | Admitting: Pulmonary Disease

## 2024-06-22 ENCOUNTER — Ambulatory Visit

## 2024-06-22 ENCOUNTER — Ambulatory Visit: Attending: Pulmonary Disease | Admitting: Pulmonary Disease

## 2024-06-22 VITALS — BP 115/76 | HR 72 | Ht 62.0 in | Wt 193.0 lb

## 2024-06-22 DIAGNOSIS — I498 Other specified cardiac arrhythmias: Secondary | ICD-10-CM

## 2024-06-22 DIAGNOSIS — I456 Pre-excitation syndrome: Secondary | ICD-10-CM

## 2024-06-22 DIAGNOSIS — I493 Ventricular premature depolarization: Secondary | ICD-10-CM

## 2024-06-22 DIAGNOSIS — I10 Essential (primary) hypertension: Secondary | ICD-10-CM | POA: Diagnosis not present

## 2024-06-22 DIAGNOSIS — R002 Palpitations: Secondary | ICD-10-CM

## 2024-06-22 NOTE — Patient Instructions (Signed)
 Medication Instructions:  Your physician recommends that you continue on your current medications as directed. Please refer to the Current Medication list given to you today.  *If you need a refill on your cardiac medications before your next appointment, please call your pharmacy*  Lab Work: None ordered If you have labs (blood work) drawn today and your tests are completely normal, you will receive your results only by: MyChart Message (if you have MyChart) OR A paper copy in the mail If you have any lab test that is abnormal or we need to change your treatment, we will call you to review the results.  Testing/Procedures: Your physician has requested that you have an echocardiogram. Echocardiography is a painless test that uses sound waves to create images of your heart. It provides your doctor with information about the size and shape of your heart and how well your heart's chambers and valves are working. This procedure takes approximately one hour. There are no restrictions for this procedure. Please do NOT wear cologne, perfume, aftershave, or lotions (deodorant is allowed). Please arrive 15 minutes prior to your appointment time.  Please note: We ask at that you not bring children with you during ultrasound (echo/ vascular) testing. Due to room size and safety concerns, children are not allowed in the ultrasound rooms during exams. Our front office staff cannot provide observation of children in our lobby area while testing is being conducted. An adult accompanying a patient to their appointment will only be allowed in the ultrasound room at the discretion of the ultrasound technician under special circumstances. We apologize for any inconvenience.   ZIO XT- Long Term Monitor Instructions  Your physician has requested you wear a ZIO patch monitor for 14 days.  This is a single patch monitor. [employer] supplies one patch monitor per enrollment. Additional stickers are not available. Please  do not apply patch if you will be having a Nuclear Stress Test,  Echocardiogram, Cardiac CT, MRI, or Chest Xray during the period you would be wearing the  monitor. The patch cannot be worn during these tests. You cannot remove and re-apply the  ZIO XT patch monitor.  Your ZIO patch monitor will be mailed 3 day USPS to your address on file. It may take 3-5 days  to receive your monitor after you have been enrolled.  Once you have received your monitor, please review the enclosed instructions. Your monitor  has already been registered assigning a specific monitor serial # to you.  Billing and Patient Assistance Program Information  We have supplied [employer] with any of your insurance information on file for billing purposes. [employer] offers a sliding scale Patient Assistance Program for patients that do not have  insurance, or whose insurance does not completely cover the cost of the ZIO monitor.  You must apply for the Patient Assistance Program to qualify for this discounted rate.  To apply, please call [employer] at [phone], select option 4, select option 2, ask to apply for  Patient Assistance Program. Meredeth will ask your household income, and how many people  are in your household. They will quote your out-of-pocket cost based on that information.  [employer] will also be able to set up a 58-month, interest-free payment plan if needed.  Applying the monitor   Shave hair from upper left chest.  Hold abrader disc by orange tab. Rub abrader in 40 strokes over the upper left chest as  indicated in your monitor instructions.  Clean area with 4 enclosed alcohol  pads.  Let dry.  Apply patch as indicated in monitor instructions. Patch will be placed under collarbone on left  side of chest with arrow pointing upward.  Rub patch adhesive wings for 2 minutes. Remove white label marked 1. Remove the white  label marked 2. Rub patch adhesive wings for 2 additional minutes.  While looking in a  mirror, press and release button in center of patch. A small green light will  flash 3-4 times. This will be your only indicator that the monitor has been turned on.  Do not shower for the first 24 hours. You may shower after the first 24 hours.  Press the button if you feel a symptom. You will hear a small click. Record Date, Time and  Symptom in the Patient Logbook.  When you are ready to remove the patch, follow instructions on the last 2 pages of Patient  Logbook. Stick patch monitor onto the last page of Patient Logbook.  Place Patient Logbook in the blue and white box. Use locking tab on box and tape box closed  securely. The blue and white box has prepaid postage on it. Please place it in the mailbox as  soon as possible. Your physician should have your test results approximately 7 days after the  monitor has been mailed back to [facility].  Call [facility] Customer Care at [phone] if you have questions regarding  your ZIO XT patch monitor. Call them immediately if you see an orange light blinking on your  monitor.  If your monitor falls off in less than 4 days, contact our Monitor department at [phone].  If your monitor becomes loose or falls off after 4 days call [employer] at [phone] for  suggestions on securing your monitor   Follow-Up: At [facility], you and your health needs are our priority.  As part of our continuing mission to provide you with exceptional heart care, our providers are all part of one team.  This team includes your primary Cardiologist (physician) and Advanced Practice Providers or APPs (Physician Assistants and Nurse Practitioners) who all work together to provide you with the care you need, when you need it.  Your next appointment:   1 year(s)  Provider:   Daphne Barrack, NP    We recommend signing up for the patient portal called MyChart.  Sign up information is provided on this After Visit Summary.  MyChart is used to  connect with patients for Virtual Visits (Telemedicine).  Patients are able to view lab/test results, encounter notes, upcoming appointments, etc.  Non-urgent messages can be sent to your provider as well.   To learn more about what you can do with MyChart, go to [email or web address].

## 2024-06-22 NOTE — Progress Notes (Unsigned)
Enrolled for [employer] to mail a ZIO XT long term holter monitor to the patients address on file.  °Dr. Taylor to read. °

## 2024-06-23 ENCOUNTER — Encounter (HOSPITAL_BASED_OUTPATIENT_CLINIC_OR_DEPARTMENT_OTHER): Payer: Self-pay | Admitting: Orthopedic Surgery

## 2024-06-23 NOTE — Progress Notes (Signed)
 Reviewed cardiac notes with Dr. Stoltzfus. Clearance for surgery is sufficient no further testing needed for surgery.

## 2024-06-24 ENCOUNTER — Ambulatory Visit: Admitting: Pulmonary Disease

## 2024-06-29 ENCOUNTER — Encounter (HOSPITAL_BASED_OUTPATIENT_CLINIC_OR_DEPARTMENT_OTHER)
Admission: RE | Admit: 2024-06-29 | Discharge: 2024-06-29 | Disposition: A | Discharge status: Home / Self Care | Source: Ambulatory Visit | Attending: Orthopedic Surgery | Admitting: Orthopedic Surgery

## 2024-06-29 DIAGNOSIS — M25661 Stiffness of right knee, not elsewhere classified: Secondary | ICD-10-CM | POA: Diagnosis not present

## 2024-06-29 DIAGNOSIS — Z79899 Other long term (current) drug therapy: Secondary | ICD-10-CM | POA: Diagnosis not present

## 2024-06-29 DIAGNOSIS — G4730 Sleep apnea, unspecified: Secondary | ICD-10-CM | POA: Diagnosis not present

## 2024-06-29 DIAGNOSIS — Z794 Long term (current) use of insulin: Secondary | ICD-10-CM | POA: Diagnosis not present

## 2024-06-29 DIAGNOSIS — E1141 Type 2 diabetes mellitus with diabetic mononeuropathy: Secondary | ICD-10-CM | POA: Diagnosis not present

## 2024-06-29 DIAGNOSIS — R2689 Other abnormalities of gait and mobility: Secondary | ICD-10-CM | POA: Diagnosis not present

## 2024-06-29 DIAGNOSIS — G5621 Lesion of ulnar nerve, right upper limb: Secondary | ICD-10-CM | POA: Diagnosis not present

## 2024-06-29 DIAGNOSIS — G5611 Other lesions of median nerve, right upper limb: Secondary | ICD-10-CM | POA: Diagnosis not present

## 2024-06-29 DIAGNOSIS — G5601 Carpal tunnel syndrome, right upper limb: Secondary | ICD-10-CM | POA: Diagnosis not present

## 2024-06-29 DIAGNOSIS — J45909 Unspecified asthma, uncomplicated: Secondary | ICD-10-CM | POA: Diagnosis not present

## 2024-06-29 DIAGNOSIS — M25561 Pain in right knee: Secondary | ICD-10-CM | POA: Diagnosis not present

## 2024-06-29 DIAGNOSIS — R531 Weakness: Secondary | ICD-10-CM | POA: Diagnosis not present

## 2024-06-29 LAB — BASIC METABOLIC PANEL WITH GFR
Anion gap: 10 (ref 5–15)
BUN: 8 mg/dL (ref 6–20)
CO2: 25 mmol/L (ref 22–32)
Calcium: 9.3 mg/dL (ref 8.9–10.3)
Chloride: 100 mmol/L (ref 98–111)
Creatinine, Ser: 0.89 mg/dL (ref 0.44–1.00)
GFR, Estimated: >60 mL/min (ref >60)
Glucose, Bld: 356 mg/dL — ABNORMAL HIGH (ref 70–99)
Potassium: 4.8 mmol/L (ref 3.5–5.1)
Sodium: 135 mmol/L (ref 135–145)

## 2024-06-29 NOTE — H&P (Signed)
  History:  CC / Reason for Visit: Bilateral carpal tunnel syndrome HPI: This patient returns to clinic today for reevaluation after having had a right cubital tunnel injection on 05/12/2024.  She indicates that the injection was very helpful for relieving her symptoms into her small and ring fingers.  She states that there is also decrease of discomfort at the elbow.  She notes despite the improvement, it is not completely well and would like to move forward with surgery for both right and moderate carpal tunnel and what appears to be right cubital tunnel the last week of September 2025.    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  She reports on the left side her symptoms are not too severe, but on the right side she has quite a bit of numbness and tingling and claims to have a small finger that remains numb constantly.  She also is a type II diabetic with her last A1c10 who is currently on a shorter acting insulin  regimen to help control her blood glucose.  She went to see Dr. Irving who evaluated her for Achilles tendinitis as well as bilateral hand numbness and tingling.  He then also sent her for nerve conduction studies which indicated that she had moderate right-sided carpal tunnel and mild left-sided carpal tunnel but no ulnar nerve involvement.  She is here today for further evaluation and treatment.  Review of systems as related to current complaint reviewed and unchanged.  Exam:  Vitals: Refer to EMR. Constitutional:  WD, WN, NAD HEENT:  NCAT, EOMI Neuro/Psych:  Alert & oriented to person, place, and time; appropriate mood & affect Lymphatic: No generalized UE edema or lymphadenopathy Extremities / MSK:  Both UE are normal with respect to appearance, ranges of motion, joint stability, muscle strength/tone, sensation, & perfusion except as otherwise  noted:  Bilateral hands are normal in appearance without thenar wasting.  There is full digital motion.  There is a Tinel sign at both carpal tunnels.  Monofilaments are 2.83 all digits as on 04/29/2024.  Right sided altered sensibility into small and ring fingers have improved subjectively.  Flexed elbow exam on the right was not performed today.  Labs / Xrays:  No radiographic studies obtained today.  Assessment: 1.  Mild left carpal tunnel 2.  Moderate right carpal tunnel with symptoms extending into the small finger with positive results after a cubital tunnel injection on 05/12/2024  Plan:  We discussed these findings at length.  The patient wishes to move forward with right endoscopic carpal tunnel release and right cubital tunnel release.The details of the operative procedure were discussed with the patient.  Questions were invited and answered.  In addition to the goal of the procedure, the risks of the procedure to include but not limited to bleeding; infection; damage to the nerves or blood vessels that could result in bleeding, numbness, weakness, chronic pain, and the need for additional procedures; stiffness; the need for revision surgery; and anesthetic risks were reviewed.  No specific outcome was guaranteed or implied.

## 2024-06-29 NOTE — Progress Notes (Signed)

## 2024-06-30 ENCOUNTER — Ambulatory Visit (HOSPITAL_BASED_OUTPATIENT_CLINIC_OR_DEPARTMENT_OTHER)
Admission: RE | Admit: 2024-06-30 | Discharge: 2024-06-30 | Disposition: A | Discharge status: Home / Self Care | Attending: Orthopedic Surgery | Admitting: Orthopedic Surgery

## 2024-06-30 ENCOUNTER — Ambulatory Visit (HOSPITAL_BASED_OUTPATIENT_CLINIC_OR_DEPARTMENT_OTHER): Admitting: Anesthesiology

## 2024-06-30 ENCOUNTER — Other Ambulatory Visit (HOSPITAL_COMMUNITY): Payer: Self-pay

## 2024-06-30 ENCOUNTER — Encounter (HOSPITAL_BASED_OUTPATIENT_CLINIC_OR_DEPARTMENT_OTHER)
Admission: RE | Disposition: A | Discharge status: Home / Self Care | Payer: Self-pay | Source: Home / Self Care | Attending: Orthopedic Surgery

## 2024-06-30 ENCOUNTER — Other Ambulatory Visit: Payer: Self-pay

## 2024-06-30 ENCOUNTER — Encounter (HOSPITAL_BASED_OUTPATIENT_CLINIC_OR_DEPARTMENT_OTHER): Payer: Self-pay | Admitting: Orthopedic Surgery

## 2024-06-30 DIAGNOSIS — G5621 Lesion of ulnar nerve, right upper limb: Secondary | ICD-10-CM | POA: Diagnosis not present

## 2024-06-30 DIAGNOSIS — E785 Hyperlipidemia, unspecified: Secondary | ICD-10-CM | POA: Diagnosis not present

## 2024-06-30 DIAGNOSIS — Z01818 Encounter for other preprocedural examination: Secondary | ICD-10-CM

## 2024-06-30 DIAGNOSIS — Z794 Long term (current) use of insulin: Secondary | ICD-10-CM | POA: Insufficient documentation

## 2024-06-30 DIAGNOSIS — Z79899 Other long term (current) drug therapy: Secondary | ICD-10-CM | POA: Insufficient documentation

## 2024-06-30 DIAGNOSIS — G5611 Other lesions of median nerve, right upper limb: Secondary | ICD-10-CM | POA: Insufficient documentation

## 2024-06-30 DIAGNOSIS — G5601 Carpal tunnel syndrome, right upper limb: Secondary | ICD-10-CM | POA: Insufficient documentation

## 2024-06-30 DIAGNOSIS — I499 Cardiac arrhythmia, unspecified: Secondary | ICD-10-CM

## 2024-06-30 DIAGNOSIS — J45909 Unspecified asthma, uncomplicated: Secondary | ICD-10-CM | POA: Insufficient documentation

## 2024-06-30 DIAGNOSIS — I10 Essential (primary) hypertension: Secondary | ICD-10-CM

## 2024-06-30 DIAGNOSIS — G4730 Sleep apnea, unspecified: Secondary | ICD-10-CM | POA: Insufficient documentation

## 2024-06-30 DIAGNOSIS — E1141 Type 2 diabetes mellitus with diabetic mononeuropathy: Secondary | ICD-10-CM | POA: Insufficient documentation

## 2024-06-30 HISTORY — PX: CARPAL TUNNEL RELEASE: SHX101

## 2024-06-30 HISTORY — PX: NERVE TUNNEL DECOMPRESSION, UPPER EXTREMITY: SHX7430

## 2024-06-30 LAB — GLUCOSE, CAPILLARY
Glucose-Capillary: 212 mg/dL — ABNORMAL HIGH (ref 70–99)
Glucose-Capillary: 179 mg/dL — ABNORMAL HIGH (ref 70–99)
Glucose-Capillary: 167 mg/dL — ABNORMAL HIGH (ref 70–99)

## 2024-06-30 SURGERY — NERVE TUNNEL DECOMPRESSION, UPPER EXTREMITY
Anesthesia: Monitor Anesthesia Care | Site: Wrist | Laterality: Right

## 2024-06-30 MED ORDER — ACETAMINOPHEN 500 MG PO TABS
ORAL_TABLET | ORAL | Status: AC
  Filled 2024-06-30: qty 2

## 2024-06-30 MED ORDER — PROPOFOL 500 MG/50ML IV EMUL
INTRAVENOUS | Status: DC | PRN
  Administered 2024-06-30: 75 ug/kg/min via INTRAVENOUS

## 2024-06-30 MED ORDER — OXYCODONE HCL 5 MG/5ML PO SOLN: 5.0000 mg | Freq: Once | ORAL | Status: DC | PRN

## 2024-06-30 MED ORDER — LIDOCAINE-EPINEPHRINE (PF) 1 %-1:200000 IJ SOLN
INTRAMUSCULAR | Status: AC
  Filled 2024-06-30: qty 30

## 2024-06-30 MED ORDER — ONDANSETRON HCL 4 MG/2ML IJ SOLN
4.0000 mg | Freq: Once | INTRAMUSCULAR | Status: AC
  Administered 2024-06-30: 4 mg via INTRAVENOUS

## 2024-06-30 MED ORDER — MIDAZOLAM HCL 2 MG/2ML IJ SOLN
INTRAMUSCULAR | Status: AC
  Filled 2024-06-30: qty 2

## 2024-06-30 MED ORDER — ONDANSETRON HCL 4 MG/2ML IJ SOLN
INTRAMUSCULAR | Status: AC
  Filled 2024-06-30: qty 4

## 2024-06-30 MED ORDER — TRAMADOL HCL 50 MG PO TABS
50.0000 mg | ORAL_TABLET | Freq: Four times a day (QID) | ORAL | 0 refills | Status: DC | PRN
  Filled 2024-06-30: qty 20, 5d supply, fill #0

## 2024-06-30 MED ORDER — MIDAZOLAM HCL 2 MG/2ML IJ SOLN
2.0000 mg | Freq: Once | INTRAMUSCULAR | Status: AC
  Administered 2024-06-30: 2 mg via INTRAVENOUS

## 2024-06-30 MED ORDER — OXYCODONE HCL 5 MG PO TABS: 5.0000 mg | ORAL_TABLET | Freq: Once | ORAL | Status: DC | PRN

## 2024-06-30 MED ORDER — FENTANYL CITRATE (PF) 100 MCG/2ML IJ SOLN
INTRAMUSCULAR | Status: DC | PRN
  Administered 2024-06-30 (×2): 25 ug via INTRAVENOUS

## 2024-06-30 MED ORDER — ONDANSETRON HCL 4 MG/2ML IJ SOLN
INTRAMUSCULAR | Status: AC
  Filled 2024-06-30: qty 2

## 2024-06-30 MED ORDER — FENTANYL CITRATE (PF) 100 MCG/2ML IJ SOLN
100.0000 ug | Freq: Once | INTRAMUSCULAR | Status: AC
  Administered 2024-06-30: 50 ug via INTRAVENOUS

## 2024-06-30 MED ORDER — FENTANYL CITRATE (PF) 100 MCG/2ML IJ SOLN: 25.0000 ug | INTRAMUSCULAR | Status: DC | PRN

## 2024-06-30 MED ORDER — LACTATED RINGERS IV SOLN
INTRAVENOUS | Status: DC
Start: 2024-06-30 — End: 2024-06-30

## 2024-06-30 MED ORDER — POVIDONE-IODINE 10 % EX SWAB: Freq: Once | CUTANEOUS | Status: DC

## 2024-06-30 MED ORDER — INSULIN ASPART 100 UNIT/ML IJ SOLN
2.0000 [IU] | Freq: Once | INTRAMUSCULAR | Status: AC
  Administered 2024-06-30: 2 [IU] via SUBCUTANEOUS

## 2024-06-30 MED ORDER — FENTANYL CITRATE (PF) 100 MCG/2ML IJ SOLN
INTRAMUSCULAR | Status: AC
  Filled 2024-06-30: qty 2

## 2024-06-30 MED ORDER — LIDOCAINE 2% (20 MG/ML) 5 ML SYRINGE
INTRAMUSCULAR | Status: AC
  Filled 2024-06-30: qty 10

## 2024-06-30 MED ORDER — CEFAZOLIN SODIUM-DEXTROSE 2-4 GM/100ML-% IV SOLN
INTRAVENOUS | Status: AC
  Filled 2024-06-30: qty 100

## 2024-06-30 MED ORDER — ROPIVACAINE HCL 5 MG/ML IJ SOLN
INTRAMUSCULAR | Status: DC | PRN
  Administered 2024-06-30: 25 mL via PERINEURAL

## 2024-06-30 MED ORDER — CEFAZOLIN SODIUM-DEXTROSE 2-4 GM/100ML-% IV SOLN
2.0000 g | INTRAVENOUS | Status: AC
  Administered 2024-06-30: 2 g via INTRAVENOUS

## 2024-06-30 MED ORDER — DEXAMETHASONE SODIUM PHOSPHATE 10 MG/ML IJ SOLN
INTRAMUSCULAR | Status: DC | PRN
  Administered 2024-06-30: 10 mg via PERINEURAL

## 2024-06-30 MED ORDER — AMISULPRIDE (ANTIEMETIC) 5 MG/2ML IV SOLN: 10.0000 mg | Freq: Once | INTRAVENOUS | Status: DC | PRN

## 2024-06-30 MED ORDER — PROPOFOL 500 MG/50ML IV EMUL
INTRAVENOUS | Status: AC
  Filled 2024-06-30: qty 50

## 2024-06-30 MED ORDER — 0.9 % SODIUM CHLORIDE (POUR BTL) OPTIME
TOPICAL | Status: DC | PRN
Start: 2024-06-30 — End: 2024-06-30
  Administered 2024-06-30: 1000 mL

## 2024-06-30 MED ORDER — SODIUM BICARBONATE 4.2 % IV SOLN
INTRAVENOUS | Status: AC
  Filled 2024-06-30: qty 10

## 2024-06-30 MED ORDER — ACETAMINOPHEN 325 MG PO TABS: 650.0000 mg | ORAL_TABLET | Freq: Four times a day (QID) | ORAL | Status: AC

## 2024-06-30 MED ORDER — ACETAMINOPHEN 500 MG PO TABS
1000.0000 mg | ORAL_TABLET | Freq: Once | ORAL | Status: AC
  Administered 2024-06-30: 1000 mg via ORAL

## 2024-06-30 SURGICAL SUPPLY — 33 items
APPLICATOR COTTON TIP 6 STRL (MISCELLANEOUS) ×2 IMPLANT
BLADE HOOK ENDO STRL (BLADE) ×2 IMPLANT
BLADE SURG 15 STRL LF DISP TIS (BLADE) ×2 IMPLANT
BLADE TRIANGLE EPF/EGR ENDO (BLADE) ×2 IMPLANT
BNDG COHESIVE 4X5 TAN STRL LF (GAUZE/BANDAGES/DRESSINGS) ×2 IMPLANT
BNDG COMPR ESMARK 4X3 LF (GAUZE/BANDAGES/DRESSINGS) ×2 IMPLANT
BNDG GAUZE DERMACEA FLUFF 4 (GAUZE/BANDAGES/DRESSINGS) ×2 IMPLANT
CHLORAPREP W/TINT 26 (MISCELLANEOUS) ×2 IMPLANT
CORD BIPOLAR FORCEPS 12FT (ELECTRODE) IMPLANT
COVER BACK TABLE 60X90IN (DRAPES) ×2 IMPLANT
COVER MAYO STAND STRL (DRAPES) ×2 IMPLANT
CUFF TOURN SGL QUICK 18X4 (TOURNIQUET CUFF) IMPLANT
DRAPE EXTREMITY T 121X128X90 (DISPOSABLE) ×2 IMPLANT
DRAPE SURG 17X23 STRL (DRAPES) ×2 IMPLANT
DRSG EMULSION OIL 3X3 NADH (GAUZE/BANDAGES/DRESSINGS) ×2 IMPLANT
GAUZE SPONGE 4X4 12PLY STRL LF (GAUZE/BANDAGES/DRESSINGS) ×2 IMPLANT
GLOVE BIO SURGEON STRL SZ7.5 (GLOVE) ×2 IMPLANT
GLOVE BIOGEL PI IND STRL 7.0 (GLOVE) ×2 IMPLANT
GLOVE BIOGEL PI IND STRL 8 (GLOVE) ×2 IMPLANT
GLOVE ECLIPSE 6.5 STRL STRAW (GLOVE) ×2 IMPLANT
GOWN STRL REUS W/ TWL LRG LVL3 (GOWN DISPOSABLE) ×4 IMPLANT
GOWN STRL REUS W/TWL XL LVL3 (GOWN DISPOSABLE) ×2 IMPLANT
NDL HYPO 25X1 1.5 SAFETY (NEEDLE) IMPLANT
NEEDLE HYPO 25X1 1.5 SAFETY (NEEDLE) IMPLANT
NS IRRIG 1000ML POUR BTL (IV SOLUTION) ×2 IMPLANT
PACK BASIN DAY SURGERY FS (CUSTOM PROCEDURE TRAY) ×2 IMPLANT
PADDING CAST ABS COTTON 4X4 ST (CAST SUPPLIES) IMPLANT
STOCKINETTE 6 STRL (DRAPES) ×2 IMPLANT
SUT VICRYL RAPIDE 4-0 (SUTURE) ×2 IMPLANT
SYR 10ML LL (SYRINGE) IMPLANT
SYR BULB EAR ULCER 3OZ GRN STR (SYRINGE) ×2 IMPLANT
TOWEL GREEN STERILE FF (TOWEL DISPOSABLE) ×4 IMPLANT
UNDERPAD 30X36 HEAVY ABSORB (UNDERPADS AND DIAPERS) ×2 IMPLANT

## 2024-06-30 NOTE — Anesthesia Procedure Notes (Signed)
 Anesthesia Regional Block: Supraclavicular block   Pre-Anesthetic Checklist: , timeout performed,  Correct Patient, Correct Site, Correct Laterality,  Correct Procedure, Correct Position, site marked,  Risks and benefits discussed,  Pre-op evaluation,  At surgeon's request and post-op pain management  Laterality: Right  Prep: Maximum Sterile Barrier Precautions used, chloraprep       Needles:  Injection technique: Single-shot  Needle Type: Echogenic Stimulator Needle     Needle Length: 5cm  Needle Gauge: 21     Additional Needles:   Procedures:,,,, ultrasound used (permanent image in chart),,    Narrative:  Start time: 06/30/2024 2:13 PM End time: 06/30/2024 2:16 PM Injection made incrementally with aspirations every 5 mL. Anesthesiologist: Niels Marien CROME, MD

## 2024-06-30 NOTE — Transfer of Care (Signed)
 Immediate Anesthesia Transfer of Care Note  Patient: Carolyn Phillips  Procedure(s) Performed: Procedure(s) (LRB): NERVE TUNNEL DECOMPRESSION, UPPER EXTREMITY (Right) RELEASE, CARPAL TUNNEL, ENDOSCOPIC (Right)  Patient Location: PACU  Anesthesia Type: MAC  Level of Consciousness: awake, alert , oriented and patient cooperative  Airway & Oxygen Therapy: Patient Spontanous Breathing and Patient connected to face mask oxygen  Post-op Assessment: Report given to PACU RN and Post -op Vital signs reviewed and stable  Post vital signs: Reviewed and stable  Complications: No apparent anesthesia complications  Last Vitals:  Vitals Value Taken Time  BP    Temp    Pulse    Resp    SpO2      Last Pain:  Vitals:   06/30/24 1258  TempSrc: Temporal  PainSc: 4          Complications: No notable events documented.

## 2024-06-30 NOTE — Discharge Instructions (Addendum)
 Discharge Instructions   You have a light dressing on your arm and hand. You may begin gentle motion of your fingers and hand immediately, but you should not do any heavy lifting or gripping.  Elevate your hand to reduce pain & swelling of the digits.  Ice over the operative site may be helpful to reduce pain & swelling.  DO NOT USE HEAT. Take Tylenol  650 mg every 6 hours. Go back to taking Meloxicam  daily as prescribed. We will prescribe Tramadol  for pain management additionally to be used as a rescue medicine for severe post operative pain. Leave the dressing in place until the third day after your surgery and then remove it, leaving it open to air.  After the bandage has been removed you may shower, regularly washing the incision and letting the water run over it, but not submerging it (no swimming, soaking it in dishwater, etc.) You may drive a car when you are off of prescription pain medications and can safely control your vehicle with both hands. You may have already made your follow-up appointment when we completed your preop visit.  If not, please call our office today or the next business day to make your return appointment for 10-15 days after surgery.   Please call [phone] during normal business hours or [phone] after hours for any problems. Including the following:  - excessive redness of the incisions - drainage for more than 4 days - fever of more than 101.5 F  *Please note that pain medications will not be refilled after hours or on weekends.   Work Status: Out of work 06/30/24-07/04/24. May return to work 07/05/24 with no lifting, gripping or grasping greater than pencil and paper tasks until first post operative appointment.   Post Anesthesia Home Care Instructions  Activity: Get plenty of rest for the remainder of the day. A responsible individual must stay with you for 24 hours following the procedure.  For the next 24 hours, DO NOT: -Drive a car -Social worker -Drink alcoholic beverages -Take any medication unless instructed by your physician -Make any legal decisions or sign important papers.  Meals: Start with liquid foods such as gelatin or soup. Progress to regular foods as tolerated. Avoid greasy, spicy, heavy foods. If nausea and/or vomiting occur, drink only clear liquids until the nausea and/or vomiting subsides. Call your physician if vomiting continues.  Special Instructions/Symptoms: Your throat may feel dry or sore from the anesthesia or the breathing tube placed in your throat during surgery. If this causes discomfort, gargle with warm salt water. The discomfort should disappear within 24 hours.  If you had a scopolamine  patch placed behind your ear for the management of post- operative nausea and/or vomiting:  1. The medication in the patch is effective for 72 hours, after which it should be removed.  Wrap patch in a tissue and discard in the trash. Wash hands thoroughly with soap and water. 2. You may remove the patch earlier than 72 hours if you experience unpleasant side effects which may include dry mouth, dizziness or visual disturbances. 3. Avoid touching the patch. Wash your hands with soap and water after contact with the patch.     Regional Anesthesia Blocks  1. You may not be able to move or feel the blocked extremity after a regional anesthetic block. This may last may last from 3-48 hours after placement, but it will go away. The length of time depends on the medication injected and your individual response to the medication.  As the nerves start to wake up, you may experience tingling as the movement and feeling returns to your extremity. If the numbness and inability to move your extremity has not gone away after 48 hours, please call your surgeon.   2. The extremity that is blocked will need to be protected until the numbness is gone and the strength has returned. Because you cannot feel it, you will need to  take extra care to avoid injury. Because it may be weak, you may have difficulty moving it or using it. You may not know what position it is in without looking at it while the block is in effect.  3. For blocks in the legs and feet, returning to weight bearing and walking needs to be done carefully. You will need to wait until the numbness is entirely gone and the strength has returned. You should be able to move your leg and foot normally before you try and bear weight or walk. You will need someone to be with you when you first try to ensure you do not fall and possibly risk injury.  4. Bruising and tenderness at the needle site are common side effects and will resolve in a few days.  5. Persistent numbness or new problems with movement should be communicated to the surgeon or the [facility] [phone] [facility] [phone]).  No tylenol  until after 9pm

## 2024-06-30 NOTE — Interval H&P Note (Signed)
 History and Physical Interval Note:  06/30/2024 1:37 PM  Carolyn Phillips  has presented today for surgery, with the diagnosis of RIGHT ULNAR AND MEDIAN COMPRESSIVE NEUROPATHIES.  The various methods of treatment have been discussed with the patient and family. After consideration of risks, benefits and other options for treatment, the patient has consented to  Procedure(s) with comments: NERVE TUNNEL DECOMPRESSION, UPPER EXTREMITY (Right) - Ulnar nerve RELEASE, CARPAL TUNNEL, ENDOSCOPIC (Right) - carpal tunnel release as a surgical intervention.  The patient's history has been reviewed, patient examined, no change in status, stable for surgery.  I have reviewed the patient's chart and labs.  Questions were answered to the patient's satisfaction.     [name]

## 2024-06-30 NOTE — Op Note (Signed)
 06/30/2024  1:37 PM  PATIENT:  Carolyn Phillips  48 y.o. female  PRE-OPERATIVE DIAGNOSIS: Right ulnar neuropathy at the elbow and median neuropathy at the wrist  POST-OPERATIVE DIAGNOSIS:  Same  PROCEDURE: Right ulnar neuroplasty at the elbow & ECTR  SURGEON: Alm LABOR. Sebastian, MD  PHYSICIAN ASSISTANT: Abigail Qua, OPA-C  ANESTHESIA: Regional block/MAC  SPECIMENS:  None  DRAINS:   None  EBL: Less than 10 mL  PREOPERATIVE INDICATIONS:  Carolyn Phillips is a  48 y.o. female with right ulnar neuropathy at the elbow and median neuropathy at the wrists, confirmed electrodiagnostically and having not resolved with nonoperative management.  The risks benefits and alternatives were discussed with the patient preoperatively including but not limited to the risks of infection, bleeding, nerve injury, cardiopulmonary complications, the need for revision surgery, among others, and the patient verbalized understanding and consented to proceed.  OPERATIVE IMPLANTS: None  OPERATIVE PROCEDURE:  After receiving prophylactic antibiotics and a regional block, the patient was escorted to the operative theatre and placed in a supine position.  A surgical "time-out" was performed during which the planned procedure, proposed operative site, and the correct patient identity were compared to the operative consent and agreement confirmed by the circulating nurse according to current facility policy.  Following application of a tourniquet to the operative extremity, the exposed skin was prepped with Chloraprep and draped in the usual sterile fashion.  The limb was exsanguinated with an Esmarch bandage and the tourniquet inflated to approximately higher than systolic BP.  The ECTR was addressed first:  The incisions were made sharply. Subcutaneous tissues were dissected with blunt and spreading dissection. At the proximal incision, the deep forearm fascia was split in line with the skin  incision the distal edge grasped with a hemostat. At the mid palmar incision, the palmar fascia was split in line with the skin incision, revealing the underlying superficial palmar arch which was visualized with loupe assisted magnification. The synovial reflector was then introduced into the proximal incision and passed through the carpal canal, uses to reflect synovium from the deep surface of the transverse carpal ligament. It was removed and replaced with the slotted cannula and blunt obturator, passing from proximal to distal and exiting the distal wound superficial to the superficial palmar arch. The obturator was removed and the camera inserted. Visualization of the ligament was acceptable. The triangle shape blade was then inserted distally, advanced to the midportion of the ligament, and there used to create a perforation in the ligament. This instrument was removed and the hooked nstrument was inserted. It was placed into the perforation in the ligament and withdrawn distally, completing transection of the distal half of the ligament. The camera was then removed and placed into the distal end of the cannula. The hooked instrument was placed into the proximal end and advanced facility to be placed into the apex of the V. which had been formed to the distal hemi-transection of the ligament. It was withdrawn proximally, completing transection of the ligament. The adequacy of the release was judged with the scope and the instruments used as a probe. All of the endoscopic instruments are removed and the adequacy of release was again judged from the proximal incision perspective with direct loupe assisted visualization. In addition, the proximal forearm fascia was split for 2 inches proximal to the proximal incision under direct visualization using a sliding scissor technique.  Attention was shifted to the ulnar neuroplasty:  A curvilinear incision was marked and  made overlying the course of the ulnar nerve  at the elbow.  Full-thickness flaps were elevated repaired to the protect and preserve crossing cutaneous nerves.  The ulnar nerve was identified, and it was irritable to mechanical stimulation at the leading edge of the FCU.  Spreading dissection was used to expose the ear, Osborne's ligament was divided.  The deep and superficial forearm fascia's of the FCU were split between the 2 heads overlying the nerve with care to protect the nerve.  Having been freed distally, it was decompressed proximally under direct loupe assisted magnification.  Once freed for about 10 cm proximal to Osborne's ligament and throughout the course of the FCU, the elbow was ranged passively and the nerve found to be stable within the groove.  Decision was made to stick with decompression without transposition.  The tourniquet was released, some additional hemostasis obtained with bipolar electrocautery.  All of the incisions were closed with 4-0 Vicryl Rapide sutures.  The distal 2 were interrupted, the proximal  incision was with some scattered deep dermal subcuticular sutures and a  running horizontal mattress suture in the skin.  A light dressing was applied and she was taken to the recovery room.  DISPOSITION:  The patient will be discharged home today, returning in 10-15 days for re-assessment.

## 2024-06-30 NOTE — Anesthesia Preprocedure Evaluation (Signed)
 Anesthesia Evaluation  Patient identified by MRN, date of birth, ID band Patient awake    Reviewed: Allergy & Precautions, NPO status , Patient's Chart, lab work & pertinent test results, reviewed documented beta blocker date and time   History of Anesthesia Complications (+) PONV and history of anesthetic complications  Airway Mallampati: II  TM Distance: >3 FB Neck ROM: Full    Dental  (+) Dental Advisory Given, Missing,    Pulmonary asthma , sleep apnea    Pulmonary exam normal breath sounds clear to auscultation       Cardiovascular hypertension, Pt. on medications and Pt. on home beta blockers Normal cardiovascular exam+ dysrhythmias (WPW s/p ablation, PVCs)  Rhythm:Regular Rate:Normal  TTE 2019 Study Conclusions   - Left ventricle: The cavity size was normal. Wall thickness was    increased in a pattern of mild LVH. Systolic function was    vigorous. The estimated ejection fraction was in the range of 65%    to 70%. Wall motion was normal; there were no regional wall    motion abnormalities. Left ventricular diastolic function    parameters were normal.  Valves ok   Neuro/Psych  Headaches  negative psych ROS   GI/Hepatic Neg liver ROS,GERD  ,,  Endo/Other  diabetes, Type 2, Insulin  Dependent    Renal/GU negative Renal ROS  negative genitourinary   Musculoskeletal negative musculoskeletal ROS (+)    Abdominal   Peds  Hematology negative hematology ROS (+)   Anesthesia Other Findings   Reproductive/Obstetrics                              Anesthesia Physical Anesthesia Plan  ASA: 3  Anesthesia Plan: MAC and Regional   Post-op Pain Management: Regional block* and Tylenol  PO (pre-op)*   Induction: Intravenous  PONV Risk Score and Plan: 3 and Propofol  infusion, Treatment may vary due to age or medical condition, Midazolam  and Ondansetron   Airway Management Planned: Natural  Airway  Additional Equipment:   Intra-op Plan:   Post-operative Plan:   Informed Consent: I have reviewed the patients History and Physical, chart, labs and discussed the procedure including the risks, benefits and alternatives for the proposed anesthesia with the patient or authorized representative who has indicated his/her understanding and acceptance.     Dental advisory given  Plan Discussed with: CRNA  Anesthesia Plan Comments:          Anesthesia Quick Evaluation

## 2024-06-30 NOTE — Progress Notes (Signed)
Assisted Dr. Armond Hang with right, supraclavicular, ultrasound guided block. Side rails up, monitors on throughout procedure. See vital signs in flow sheet. Tolerated Procedure well.

## 2024-07-01 ENCOUNTER — Other Ambulatory Visit (HOSPITAL_COMMUNITY): Payer: Self-pay

## 2024-07-01 ENCOUNTER — Encounter (HOSPITAL_BASED_OUTPATIENT_CLINIC_OR_DEPARTMENT_OTHER): Payer: Self-pay | Admitting: Orthopedic Surgery

## 2024-07-01 ENCOUNTER — Other Ambulatory Visit: Payer: Self-pay | Admitting: Internal Medicine

## 2024-07-01 ENCOUNTER — Other Ambulatory Visit: Payer: Self-pay

## 2024-07-01 DIAGNOSIS — I493 Ventricular premature depolarization: Secondary | ICD-10-CM

## 2024-07-01 MED ORDER — HUMULIN R U-500 KWIKPEN 500 UNIT/ML ~~LOC~~ SOPN
60.0000 [IU] | PEN_INJECTOR | Freq: Three times a day (TID) | SUBCUTANEOUS | 3 refills | Status: AC
  Filled 2024-07-01 – 2024-07-05 (×2): qty 30, 84d supply, fill #0
  Filled 2024-09-27: qty 30, 84d supply, fill #1
  Filled 2024-11-01: qty 30, 84d supply, fill #0

## 2024-07-01 MED ORDER — FLECAINIDE ACETATE 50 MG PO TABS
50.0000 mg | ORAL_TABLET | Freq: Two times a day (BID) | ORAL | 3 refills | Status: AC
  Filled 2024-07-01: qty 180, 90d supply, fill #0
  Filled 2024-08-12 – 2024-09-27 (×2): qty 180, 90d supply, fill #1

## 2024-07-02 ENCOUNTER — Other Ambulatory Visit (HOSPITAL_COMMUNITY): Payer: Self-pay

## 2024-07-02 ENCOUNTER — Encounter (HOSPITAL_BASED_OUTPATIENT_CLINIC_OR_DEPARTMENT_OTHER): Payer: Self-pay | Admitting: Orthopedic Surgery

## 2024-07-02 MED ORDER — METHOCARBAMOL 500 MG PO TABS
500.0000 mg | ORAL_TABLET | Freq: Four times a day (QID) | ORAL | 2 refills | Status: AC | PRN
  Filled 2024-07-02: qty 60, 8d supply, fill #0
  Filled 2024-08-12: qty 60, 8d supply, fill #1

## 2024-07-02 NOTE — Anesthesia Postprocedure Evaluation (Signed)
 Anesthesia Post Note  Patient: Sakoya Leann Profitt  Procedure(s) Performed: NERVE TUNNEL DECOMPRESSION, UPPER EXTREMITY (Right: Elbow) RELEASE, CARPAL TUNNEL, ENDOSCOPIC (Right: Wrist)     Patient location during evaluation: PACU Anesthesia Type: Regional and MAC Level of consciousness: awake and alert Pain management: pain level controlled Vital Signs Assessment: post-procedure vital signs reviewed and stable Respiratory status: spontaneous breathing, nonlabored ventilation, respiratory function stable and patient connected to nasal cannula oxygen Cardiovascular status: stable and blood pressure returned to baseline Postop Assessment: no apparent nausea or vomiting Anesthetic complications: no   No notable events documented.  Last Vitals:  Vitals:   06/30/24 1615 06/30/24 1636  BP: 128/75 (!) 151/83  Pulse: 75 63  Resp: 17 16  Temp:  (!) 36.3 C  SpO2: 94% 95%    Last Pain:  Vitals:   07/01/24 1035  TempSrc:   PainSc: 0-No pain                 Aubrynn Katona L Costa Jha

## 2024-07-05 ENCOUNTER — Other Ambulatory Visit (HOSPITAL_COMMUNITY): Payer: Self-pay

## 2024-07-08 ENCOUNTER — Other Ambulatory Visit (HOSPITAL_COMMUNITY): Payer: Self-pay

## 2024-07-08 DIAGNOSIS — G8929 Other chronic pain: Secondary | ICD-10-CM | POA: Diagnosis not present

## 2024-07-08 DIAGNOSIS — S83511A Sprain of anterior cruciate ligament of right knee, initial encounter: Secondary | ICD-10-CM | POA: Diagnosis not present

## 2024-07-08 MED ORDER — NAPROXEN 500 MG PO TABS
500.0000 mg | ORAL_TABLET | Freq: Two times a day (BID) | ORAL | 0 refills | Status: DC | PRN
  Filled 2024-07-08: qty 60, 30d supply, fill #0

## 2024-07-09 ENCOUNTER — Other Ambulatory Visit (HOSPITAL_COMMUNITY): Payer: Self-pay

## 2024-07-09 ENCOUNTER — Other Ambulatory Visit: Payer: Self-pay

## 2024-07-09 MED ORDER — FAMOTIDINE 20 MG PO TABS
20.0000 mg | ORAL_TABLET | Freq: Two times a day (BID) | ORAL | 3 refills | Status: AC
  Filled 2024-07-09: qty 60, 30d supply, fill #0
  Filled 2024-08-12: qty 60, 30d supply, fill #1
  Filled 2024-09-27: qty 60, 30d supply, fill #2
  Filled 2024-11-01: qty 60, 30d supply, fill #0

## 2024-07-21 DIAGNOSIS — I493 Ventricular premature depolarization: Secondary | ICD-10-CM | POA: Diagnosis not present

## 2024-07-21 DIAGNOSIS — R002 Palpitations: Secondary | ICD-10-CM | POA: Diagnosis not present

## 2024-07-23 DIAGNOSIS — R002 Palpitations: Secondary | ICD-10-CM | POA: Diagnosis not present

## 2024-07-23 DIAGNOSIS — I493 Ventricular premature depolarization: Secondary | ICD-10-CM

## 2024-07-23 DIAGNOSIS — I456 Pre-excitation syndrome: Secondary | ICD-10-CM

## 2024-07-26 ENCOUNTER — Ambulatory Visit (HOSPITAL_COMMUNITY)
Admission: RE | Admit: 2024-07-26 | Discharge: 2024-07-26 | Disposition: A | Discharge status: Home / Self Care | Source: Ambulatory Visit | Attending: Cardiology | Admitting: Cardiology

## 2024-07-26 ENCOUNTER — Ambulatory Visit: Payer: Self-pay | Admitting: Pulmonary Disease

## 2024-07-26 DIAGNOSIS — R072 Precordial pain: Secondary | ICD-10-CM

## 2024-07-26 DIAGNOSIS — I498 Other specified cardiac arrhythmias: Secondary | ICD-10-CM | POA: Diagnosis not present

## 2024-07-26 DIAGNOSIS — I10 Essential (primary) hypertension: Secondary | ICD-10-CM

## 2024-07-26 LAB — ECHOCARDIOGRAM COMPLETE
Area-P 1/2: 3.37 cm2
S' Lateral: 2.46 cm

## 2024-08-12 ENCOUNTER — Other Ambulatory Visit: Payer: Self-pay

## 2024-08-12 ENCOUNTER — Encounter (HOSPITAL_COMMUNITY): Payer: Self-pay

## 2024-08-12 ENCOUNTER — Other Ambulatory Visit (HOSPITAL_COMMUNITY): Payer: Self-pay

## 2024-08-12 MED ORDER — NAPROXEN 500 MG PO TABS
500.0000 mg | ORAL_TABLET | Freq: Two times a day (BID) | ORAL | 0 refills | Status: DC | PRN
  Filled 2024-08-12: qty 60, 30d supply, fill #0

## 2024-08-13 ENCOUNTER — Other Ambulatory Visit: Payer: Self-pay

## 2024-08-13 ENCOUNTER — Other Ambulatory Visit (HOSPITAL_COMMUNITY): Payer: Self-pay

## 2024-08-16 ENCOUNTER — Other Ambulatory Visit (HOSPITAL_COMMUNITY): Payer: Self-pay

## 2024-08-16 ENCOUNTER — Other Ambulatory Visit: Payer: Self-pay

## 2024-08-16 MED ORDER — INSULIN PEN NEEDLE 32G X 4 MM MISC
1.0000 | Freq: Three times a day (TID) | 3 refills | Status: AC
  Filled 2024-08-16 – 2024-09-03 (×2): qty 300, 100d supply, fill #0
  Filled 2024-09-27: qty 300, 90d supply, fill #0

## 2024-08-17 ENCOUNTER — Other Ambulatory Visit (HOSPITAL_COMMUNITY): Payer: Self-pay

## 2024-08-18 ENCOUNTER — Other Ambulatory Visit (HOSPITAL_COMMUNITY): Payer: Self-pay

## 2024-08-19 ENCOUNTER — Other Ambulatory Visit (HOSPITAL_COMMUNITY): Payer: Self-pay

## 2024-08-19 DIAGNOSIS — M25561 Pain in right knee: Secondary | ICD-10-CM | POA: Diagnosis not present

## 2024-08-19 DIAGNOSIS — G8929 Other chronic pain: Secondary | ICD-10-CM | POA: Diagnosis not present

## 2024-08-19 DIAGNOSIS — S83511D Sprain of anterior cruciate ligament of right knee, subsequent encounter: Secondary | ICD-10-CM | POA: Diagnosis not present

## 2024-08-19 MED ORDER — NAPROXEN 500 MG PO TABS
500.0000 mg | ORAL_TABLET | Freq: Two times a day (BID) | ORAL | 0 refills | Status: AC | PRN
Start: 2024-08-19 — End: ?
  Filled 2024-08-19: qty 60, 30d supply, fill #0

## 2024-08-20 ENCOUNTER — Other Ambulatory Visit (HOSPITAL_COMMUNITY): Payer: Self-pay

## 2024-08-20 ENCOUNTER — Other Ambulatory Visit: Payer: Self-pay | Admitting: Orthopedic Surgery

## 2024-08-20 ENCOUNTER — Telehealth (HOSPITAL_BASED_OUTPATIENT_CLINIC_OR_DEPARTMENT_OTHER): Payer: Self-pay

## 2024-08-20 ENCOUNTER — Telehealth (HOSPITAL_BASED_OUTPATIENT_CLINIC_OR_DEPARTMENT_OTHER): Payer: Self-pay | Admitting: *Deleted

## 2024-08-20 NOTE — Telephone Encounter (Signed)
 S/w pt is @ [phone] to set up telephone appt.  Pt med rec and consent done. Will remove from pre op pool.     Patient Consent for Virtual Visit         [name] has provided verbal consent on 08/20/2024 for a virtual visit (video or telephone).   CONSENT FOR VIRTUAL VISIT FOR:  Carolyn Phillips  By participating in this virtual visit I agree to the following:  I hereby voluntarily request, consent and authorize [facility] and its employed or contracted physicians, physician assistants, nurse practitioners or other licensed health care professionals (the Practitioner), to provide me with telemedicine health care services (the "Services) as deemed necessary by the treating Practitioner. I acknowledge and consent to receive the Services by the Practitioner via telemedicine. I understand that the telemedicine visit will involve communicating with the Practitioner through live audiovisual communication technology and the disclosure of certain medical information by electronic transmission. I acknowledge that I have been given the opportunity to request an in-person assessment or other available alternative prior to the telemedicine visit and am voluntarily participating in the telemedicine visit.  I understand that I have the right to withhold or withdraw my consent to the use of telemedicine in the course of my care at any time, without affecting my right to future care or treatment, and that the Practitioner or I may terminate the telemedicine visit at any time. I understand that I have the right to inspect all information obtained and/or recorded in the course of the telemedicine visit and may receive copies of available information for a reasonable fee.  I understand that some of the potential risks of receiving the Services via telemedicine include:  Delay or interruption in medical evaluation due to technological equipment failure or disruption; Information  transmitted may not be sufficient (e.g. poor resolution of images) to allow for appropriate medical decision making by the Practitioner; and/or  In rare instances, security protocols could fail, causing a breach of personal health information.  Furthermore, I acknowledge that it is my responsibility to provide information about my medical history, conditions and care that is complete and accurate to the best of my ability. I acknowledge that Practitioner's advice, recommendations, and/or decision may be based on factors not within their control, such as incomplete or inaccurate data provided by me or distortions of diagnostic images or specimens that may result from electronic transmissions. I understand that the practice of medicine is not an exact science and that Practitioner makes no warranties or guarantees regarding treatment outcomes. I acknowledge that a copy of this consent can be made available to me via my patient portal [facility] MyChart), or I can request a printed copy by calling the office of [facility].    I understand that my insurance will be billed for this visit.   I have read or had this consent read to me. I understand the contents of this consent, which adequately explains the benefits and risks of the Services being provided via telemedicine.  I have been provided ample opportunity to ask questions regarding this consent and the Services and have had my questions answered to my satisfaction. I give my informed consent for the services to be provided through the use of telemedicine in my medical care

## 2024-08-20 NOTE — Telephone Encounter (Signed)
   Pre-operative Risk Assessment    Patient Name: Carolyn Phillips  DOB: [birth year] MRN: [id number]   Date of last office visit: 06/22/24 with Aniceto Date of next office visit: NA  Request for Surgical Clearance    Procedure:  right ACL reconstruction   Date of Surgery:  Clearance 09/06/24                                 Surgeon:  Dr. Laurell Surgeon's Group or Practice Name:  [facility] Phone number:  [phone] Fax number:  [phone]   Type of Clearance Requested:   - Medical    Type of Anesthesia:  General    Additional requests/questions:    SignedAugustin JONETTA Daring   08/20/2024, 10:29 AM

## 2024-08-20 NOTE — Telephone Encounter (Signed)
   Name: Carolyn Phillips  DOB: Mar 01, 1976  MRN: [id number]  Primary Cardiologist: Dr.Gregg Waddell   Preoperative team, please contact this patient and set up a phone call appointment for further preoperative risk assessment. Please obtain consent and complete medication review. Thank you for your help.Had pre-op clearance for separate procedure in September 2025.  I confirm that guidance regarding antiplatelet and oral anticoagulation therapy has been completed and, if necessary, noted below.  Patient is not on anticoagulation or antiplatelet per review of current medical record in Epic.    I also confirmed the patient resides in the state of  . As per [facility] Medical Board telemedicine laws, the patient must reside in the state in which the provider is licensed.   Lamarr Satterfield, NP 08/20/2024, 10:37 AM [facility]

## 2024-08-30 NOTE — Patient Instructions (Addendum)
 SURGICAL WAITING ROOM VISITATION  Patients having surgery or a procedure may have no more than 2 support people in the waiting area - these visitors may rotate.    Children under the age of 32 must have an adult with them who is not the patient.  Visitors with respiratory illnesses are discouraged from visiting and should remain at home.  If the patient needs to stay at the hospital during part of their recovery, the visitor guidelines for inpatient rooms apply. Pre-op nurse will coordinate an appropriate time for 1 support person to accompany patient in pre-op.  This support person may not rotate.    Please refer to the [facility] website for the visitor guidelines for Inpatients (after your surgery is over and you are in a regular room).       Your procedure is scheduled on: 09-06-24   Report to [facility] Main Entrance    Report to admitting at       0745  AM   Call this number if you have problems the morning of surgery [phone]   Do not eat food :After Midnight.   After Midnight you may have the following liquids until __0700____ AM/  DAY OF SURGERY  THEN NOTHING BY MOUTH  Water Non-Citrus Juices (without pulp, NO RED-Apple, White grape, White cranberry) Black Coffee (NO MILK/CREAM OR CREAMERS, sugar ok)  Clear Tea (NO MILK/CREAM OR CREAMERS, sugar ok) regular and decaf                             Plain Jell-O (NO RED)                                           Fruit ices (not with fruit pulp, NO RED)                                     Popsicles (NO RED)                                                               Sports drinks like Gatorade (NO RED)                   The day of surgery:  Drink ONE (1) Pre-Surgery G2 BY     0700  AM the morning of surgery. Drink in one sitting. Do not sip.  This drink was given to you during your hospital  pre-op appointment visit. Nothing else to drink after completing the  Pre-Surgery G2.          If you have  questions, please contact your surgeon's office.   FOLLOW  ANY ADDITIONAL PRE OP INSTRUCTIONS YOU RECEIVED FROM YOUR SURGEON'S OFFICE!!!     Oral Hygiene is also important to reduce your risk of infection.                                    Remember - BRUSH YOUR TEETH THE MORNING OF SURGERY  WITH YOUR REGULAR TOOTHPASTE  DENTURES WILL BE REMOVED PRIOR TO SURGERY PLEASE DO NOT APPLY Poly grip OR ADHESIVES!!!   Do NOT smoke after Midnight   Stop all vitamins and herbal supplements 7 days before surgery.   Take these medicines the morning of surgery with A SIP OF WATER: ROSUVASTATIN , NASAL SPRAY IF NEEDED, XANAX  IF NEEDED, MYRBETRIQ , SINGULAIR , MECLIZINE  IF NEEDED, FAMOTIDINE , FLECAINIDE   MOUNJARO  HOLD ONE WEEK PRIOR TO SURGERY  How to Manage Your Diabetes Before and After Surgery  Why is it important to control my blood sugar before and after surgery? Improving blood sugar levels before and after surgery helps healing and can limit problems. A way of improving blood sugar control is eating a healthy diet by:  Eating less sugar and carbohydrates  Increasing activity/exercise  Talking with your doctor about reaching your blood sugar goals High blood sugars (greater than 180 mg/dL) can raise your risk of infections and slow your recovery, so you will need to focus on controlling your diabetes during the weeks before surgery. Make sure that the doctor who takes care of your diabetes knows about your planned surgery including the date and location.  How do I manage my blood sugar before surgery? Check your blood sugar at least 4 times a day, starting 2 days before surgery, to make sure that the level is not too high or low. Check your blood sugar the morning of your surgery when you wake up and every 2 hours until you get to the Short Stay unit. If your blood sugar is less than 70 mg/dL, you will need to treat for low blood sugar: Do not take insulin . Treat a low blood sugar (less than  70 mg/dL) with  cup of clear juice (cranberry or apple), 4 glucose tablets, OR glucose gel. Recheck blood sugar in 15 minutes after treatment (to make sure it is greater than 70 mg/dL). If your blood sugar is not greater than 70 mg/dL on recheck, call [phone] for further instructions. Report your blood sugar to the short stay nurse when you get to Short Stay.  If you are admitted to the hospital after surgery: Your blood sugar will be checked by the staff and you will probably be given insulin  after surgery (instead of oral diabetes medicines) to make sure you have good blood sugar levels. The goal for blood sugar control after surgery is 80-180 mg/dL.   WHAT DO I DO ABOUT MY DIABETES MEDICATION?  Do not take oral diabetes medicines (pills) the morning of surgery.   DO NOT TAKE THE FOLLOWING 7 DAYS PRIOR TO SURGERY: Ozempic , Wegovy , Rybelsus  (Semaglutide ), Byetta (exenatide), Bydureon (exenatide ER), Victoza, Saxenda (liraglutide), or Trulicity (dulaglutide) Mounjaro  (Tirzepatide ) Adlyxin  (Lixisenatide ), Polyethylene Glycol Loxenatide.  HUMILIN R U 500 DAY BEFORE SURGERY CONTACT PRESCRIBER REGARDING DOSE REDUCTION DAY OF SURGERY CONTACT PRESCRIBER REGARDING DOSE REDUCTION (CONSIDER HOLDING ALL DOSES DAY OF SURGERY UNTIL ABLE TO EAT PO)    DO NOT TAKE ANY ORAL DIABETIC MEDICATIONS DAY OF YOUR SURGERY  Bring CPAP mask and tubing day of surgery.                              You may not have any metal on your body including hair pins, jewelry, and body piercing             Do not wear make-up, lotions, powders, perfumes/cologne, or deodorant  Do not wear nail polish including gel and S&S, artificial/acrylic  nails, or any other type of covering on natural nails including finger and toenails. If you have artificial nails, gel coating, etc. that needs to be removed by a nail salon please have this removed prior to surgery or surgery may need to be canceled/ delayed if the surgeon/  anesthesia feels like they are unable to be safely monitored.   Do not shave  48 hours prior to surgery.           Do not bring valuables to the hospital. [facility] IS NOT             RESPONSIBLE   FOR VALUABLES.   Contacts, glasses, dentures or bridgework may not be worn into surgery.   Bring small overnight bag day of surgery.   DO NOT BRING YOUR HOME MEDICATIONS TO THE HOSPITAL. PHARMACY WILL DISPENSE MEDICATIONS LISTED ON YOUR MEDICATION LIST TO YOU DURING YOUR ADMISSION IN THE HOSPITAL!    Patients discharged on the day of surgery will not be allowed to drive home.  Someone NEEDS to stay with you for the first 24 hours after anesthesia.   Special Instructions: Bring a copy of your healthcare power of attorney and living will documents the day of surgery if you haven't scanned them before.              Please read over the following fact sheets you were given: IF YOU HAVE QUESTIONS ABOUT YOUR PRE-OP INSTRUCTIONS PLEASE CALL [phone].    If you test positive for Covid or have been in contact with anyone that has tested positive in the last 10 days please notify you surgeon.     - Preparing for Surgery Before surgery, you can play an important role.  Because skin is not sterile, your skin needs to be as free of germs as possible.  You can reduce the number of germs on your skin by washing with CHG (chlorahexidine gluconate) soap before surgery.  CHG is an antiseptic cleaner which kills germs and bonds with the skin to continue killing germs even after washing. Please DO NOT use if you have an allergy to CHG or antibacterial soaps.  If your skin becomes reddened/irritated stop using the CHG and inform your nurse when you arrive at Short Stay. Do not shave (including legs and underarms) for at least 48 hours prior to the first CHG shower.  You may shave your face/neck.  Please follow these instructions carefully:  1.  Shower with CHG Soap the night before surgery ONLY (DO  NOT USE THE SOAP THE MORNING OF SURGERY).  2.  If you choose to wash your hair, wash your hair first as usual with your normal  shampoo.  3.  After you shampoo, rinse your hair and body thoroughly to remove the shampoo.                             4.  Use CHG as you would any other liquid soap.  You can apply chg directly to the skin and wash.  Gently with a scrungie or clean washcloth.  5.  Apply the CHG Soap to your body ONLY FROM THE NECK DOWN.   Do  not use on face/ open                           Wound or open sores. Avoid contact with eyes, ears mouth and genitals (private parts).  Wash face,  Genitals (private parts) with your normal soap.             6.  Wash thoroughly, paying special attention to the area where your  surgery  will be performed.  7.  Thoroughly rinse your body with warm water from the neck down.  8.  DO NOT shower/wash with your normal soap after using and rinsing off the CHG Soap.                9.  Pat yourself dry with a clean towel.            10.  Wear clean pajamas.            11.  Place clean sheets on your bed the night of your first shower and do not  sleep with pets. Day of Surgery : Do not apply any CHG, lotions/deodorants the morning of surgery.  Please wear clean clothes to the hospital/surgery center.  FAILURE TO FOLLOW THESE INSTRUCTIONS MAY RESULT IN THE CANCELLATION OF YOUR SURGERY  PATIENT SIGNATURE_________________________________  NURSE SIGNATURE__________________________________  ________________________________________________________________________  Carolyn Phillips  An incentive spirometer is a tool that can help keep your lungs clear and active. This tool measures how well you are filling your lungs with each breath. Taking long deep breaths may help reverse or decrease the chance of developing breathing (pulmonary) problems (especially infection) following: A long period of time when you are unable to move or be  active. BEFORE THE PROCEDURE  If the spirometer includes an indicator to show your best effort, your nurse or respiratory therapist will set it to a desired goal. If possible, sit up straight or lean slightly forward. Try not to slouch. Hold the incentive spirometer in an upright position. INSTRUCTIONS FOR USE  Sit on the edge of your bed if possible, or sit up as far as you can in bed or on a chair. Hold the incentive spirometer in an upright position. Breathe out normally. Place the mouthpiece in your mouth and seal your lips tightly around it. Breathe in slowly and as deeply as possible, raising the piston or the ball toward the top of the column. Hold your breath for 3-5 seconds or for as long as possible. Allow the piston or ball to fall to the bottom of the column. Remove the mouthpiece from your mouth and breathe out normally. Rest for a few seconds and repeat Steps 1 through 7 at least 10 times every 1-2 hours when you are awake. Take your time and take a few normal breaths between deep breaths. The spirometer may include an indicator to show your best effort. Use the indicator as a goal to work toward during each repetition. After each set of 10 deep breaths, practice coughing to be sure your lungs are clear. If you have an incision (the cut made at the time of surgery), support your incision when coughing by placing a pillow or rolled up towels firmly against it. Once you are able to get out of bed, walk around indoors and cough well. You may stop using the incentive spirometer when instructed by your caregiver.  RISKS AND COMPLICATIONS Take your time so you do not get dizzy or light-headed. If you are in pain, you may need to take or ask for pain medication before doing incentive spirometry. It is harder to take a deep breath if you are having pain. AFTER USE Rest and breathe slowly and easily. It can be helpful to keep track  of a log of your progress. Your caregiver can provide you  with a simple table to help with this. If you are using the spirometer at home, follow these instructions: SEEK MEDICAL CARE IF:  You are having difficultly using the spirometer. You have trouble using the spirometer as often as instructed. Your pain medication is not giving enough relief while using the spirometer. You develop fever of 100.5 F (38.1 C) or higher. SEEK IMMEDIATE MEDICAL CARE IF:  You cough up bloody sputum that had not been present before. You develop fever of 102 F (38.9 C) or greater. You develop worsening pain at or near the incision site. MAKE SURE YOU:  Understand these instructions. Will watch your condition. Will get help right away if you are not doing well or get worse. Document Released: 01/27/2007 Document Revised: 12/09/2011 Document Reviewed: 03/30/2007 [facility] Patient Information 2014 [place], [place].   ________________________________________________________________________

## 2024-08-30 NOTE — Progress Notes (Addendum)
 PCP - Glendia Freeman, MD Cardiologist -EP - Danelle Birmingham, MD   tele visit clearance 08-31-24 epic  PPM/ICD -  Device Orders -  Rep Notified -   Chest x-ray -  EKG - 06-18-24   epic Stress Test -  ECHO - 07-26-24 epic Cardiac Cath -   Sleep Study - n/a CPAP - n/a  Fasting Blood Sugar - 157-158 Checks Blood Sugar _Free style libre____ times a day  Blood Thinner Instructions:n/a Aspirin  Instructions:n/a  ERAS Protcol - PRE-SURGERY  G2-   MOUNJARO  -LAST DOSE-08-27-24  COVID vaccine -yes  Activity--Able to climb a flight of stairs with no CP or SOB Anesthesia review: PVC's, HTN, HLD, OSA, GERD,DM , WPW S/p ablatiom 07-04-14, HgbA1c 10.0  Patient denies shortness of breath, fever, cough and chest pain at PAT appointment   All instructions explained to the patient, with a verbal understanding of the material. Patient agrees to go over the instructions while at home for a better understanding. Patient also instructed to self quarantine after being tested for COVID-19. The opportunity to ask questions was provided.

## 2024-08-31 ENCOUNTER — Ambulatory Visit: Attending: Cardiology | Admitting: Nurse Practitioner

## 2024-08-31 ENCOUNTER — Other Ambulatory Visit (HOSPITAL_COMMUNITY): Payer: Self-pay

## 2024-08-31 DIAGNOSIS — Z01810 Encounter for preprocedural cardiovascular examination: Secondary | ICD-10-CM | POA: Diagnosis not present

## 2024-08-31 MED ORDER — ALBUTEROL SULFATE HFA 108 (90 BASE) MCG/ACT IN AERS
2.0000 | INHALATION_SPRAY | RESPIRATORY_TRACT | 4 refills | Status: AC | PRN
  Filled 2024-08-31: qty 6.7, 25d supply, fill #0

## 2024-08-31 NOTE — Progress Notes (Signed)
 Virtual Visit via Telephone Note   Because of [place] Leann Wassmer co-morbid illnesses, she is at least at moderate risk for complications without adequate follow up.  This format is felt to be most appropriate for this patient at this time.  Due to technical limitations with video connection (technology), today's appointment will be conducted as an audio only telehealth visit, and [employer] verbally agreed to proceed in this manner.   All issues noted in this document were discussed and addressed.  No physical exam could be performed with this format.  Evaluation Performed:  Preoperative cardiovascular risk assessment _____________   Date:  08/31/2024   Patient ID:  [place] Arvin Collet, DOB 05-20-1976, MRN [id number] Patient Location:  Home Provider location:   Office  Primary Care Provider:  Larnell Hamilton, MD Primary Cardiologist:  None  Chief Complaint / Patient Profile   48 y.o. y/o female with a h/o Wolff-Parkinson-White syndrome s/p remote ablation, PVCs, hypertension, diabetes, asthma, and GERD who is pending R ACL construction on 09/06/2024 with Dr. Laurell of [facility] and presents today for telephonic preoperative cardiovascular risk assessment.  History of Present Illness    Ralyn Bree Heinzelman is a 48 y.o. female who presents via audio/video conferencing for a telehealth visit today.  Pt was last seen in cardiology clinic on 06/22/2024 by Daphne Barrack, NP. At that time [place] Arvin Collet was doing well. The patient is now pending procedure as outlined above. Since her last visit, she has done well from a cardiac standpoint.   She denies chest pain, palpitations, dyspnea, pnd, orthopnea, n, v, dizziness, syncope, edema, weight gain, or early satiety. All other systems reviewed and are otherwise negative except as noted above.   Past Medical History    Past Medical History:  Diagnosis Date   Angio-edema    Asthma    Beta thalassemia trait     Complication of anesthesia    Diabetes mellitus without complication (HCC)    Dysrhythmia    WPW-ablation   Eczema    GERD (gastroesophageal reflux disease)    Headache    Hyperlipemia    Hypertension    Obesity    PONV (postoperative nausea and vomiting)    Recurrent upper respiratory infection (URI)    Urticaria    Wolff-Parkinson-White syndrome    Past Surgical History:  Procedure Laterality Date   ABDOMINAL HYSTERECTOMY     BLADDER REPAIR     BREAST REDUCTION SURGERY     CARPAL TUNNEL RELEASE Right 06/30/2024   Procedure: RELEASE, CARPAL TUNNEL, ENDOSCOPIC;  Surgeon: Sebastian Lenis, MD;  Location: [facility];  Service: Orthopedics;  Laterality: Right;  carpal tunnel release   CHOLECYSTECTOMY N/A 10/25/2021   Procedure: LAPAROSCOPIC CHOLECYSTECTOMY WITH INTRAOPERATIVE CHOLANGIOGRAM;  Surgeon: Belinda Cough, MD;  Location: [facility] OR;  Service: General;  Laterality: N/A;   CHONDROPLASTY Left 09/13/2015   Procedure: CHONDROPLASTY;  Surgeon: Dempsey Sensor, MD;  Location: [facility];  Service: Orthopedics;  Laterality: Left;   KNEE ARTHROSCOPY WITH DRILLING/MICROFRACTURE Left 09/13/2015   Procedure: KNEE ARTHROSCOPY WITH DRILLING/MICROFRACTURE;  Surgeon: Dempsey Sensor, MD;  Location: [facility];  Service: Orthopedics;  Laterality: Left;   NERVE TUNNEL DECOMPRESSION, UPPER EXTREMITY Right 06/30/2024   Procedure: NERVE TUNNEL DECOMPRESSION, UPPER EXTREMITY;  Surgeon: Sebastian Lenis, MD;  Location: [facility];  Service: Orthopedics;  Laterality: Right;  Ulnar nerve   REDUCTION MAMMAPLASTY Bilateral 2001   SUPRAVENTRICULAR TACHYCARDIA ABLATION N/A 07/04/2014   Procedure: WPW ABLATION;  Surgeon: Danelle LELON Birmingham, MD;  Location: [facility];  Service: Cardiovascular;  Laterality: N/A;   TUBAL LIGATION      Allergies  Allergies  Allergen Reactions   Penicillins Hives and Swelling    Received cefazolin  in 2023 without incident Has  patient had a PCN reaction causing immediate rash, facial/tongue/throat swelling, SOB or lightheadedness with hypotension: Yes Has patient had a PCN reaction causing severe rash involving mucus membranes or skin necrosis: No Has patient had a PCN reaction that required hospitalization No Has patient had a PCN reaction occurring within the last 10 years: No If all of the above answers are NO, then may proceed with Cephalosporin use.   Eye swelling   Anesthesia S-I-40a [Propofol ] Nausea And Vomiting   Empagliflozin  Other (See Comments)    Constant UTIs   Metoprolol Other (See Comments)    LOL Drugs cause hair to fall out   Nitrofurantoin Macrocrystal Other (See Comments)    Pt unsure of reaction     Home Medications    Prior to Admission medications   Medication Sig Start Date End Date Taking? Authorizing Provider  acetaminophen  (TYLENOL ) 325 MG tablet Take 2 tablets (650 mg total) by mouth every 6 (six) hours. Patient not taking: Reported on 08/25/2024 06/30/24   Sebastian Lenis, MD  albuterol  (VENTOLIN  HFA) 108 [phone] Base) MCG/ACT inhaler Inhale 2 puffs into the lungs every 4 to 6 hours as needed for shortness of breath or wheezing. 12/19/23   Jeneal Danita Macintosh, MD  albuterol  (VENTOLIN  HFA) 108 [phone] Base) MCG/ACT inhaler Inhale 2 puffs into the lungs every 4 (four) to 6 (six) hours as needed for shortness of breath/wheeze 08/31/24     ALPRAZolam  (XANAX ) 0.25 MG tablet Take 1 tablet (0.25 mg total) by mouth 2 (two) times daily as needed. Patient taking differently: Take 0.25 mg by mouth 2 (two) times daily. 06/11/24     cephALEXin  (KEFLEX ) 250 MG capsule Take 1 capsule (250 mg total) by mouth at bedtime. 11/18/23   Shona Layman BROCKS, MD  Cholecalciferol (VITAMIN D3) 125 MCG (5000 UT) CAPS Take 5,000 Units by mouth daily.     [provider]  Continuous Glucose Sensor (FREESTYLE LIBRE 3 PLUS SENSOR) MISC Apply one sensor every 15 days to continuously monitor blood glucose.  07/14/23   Larnell Hamilton, MD  Continuous Glucose Sensor (FREESTYLE LIBRE 3 SENSOR) MISC Change sensor topically every 14 (fourteen) days to monitor blood glucose continuously 01/06/23   Larnell Hamilton, MD  dicyclomine  (BENTYL ) 20 MG tablet Take 1 tablet (20 mg total) by mouth 3 (three) times daily before meals. Patient taking differently: Take 20 mg by mouth 3 (three) times daily as needed for spasms. 05/05/24   Suzann Inocente HERO, MD  estradiol  (DOTTI ) 0.075 MG/24HR Place 1 patch onto the skin 2 (two) times a week. 02/26/24     estradiol  (ESTRACE ) 0.1 MG/GM vaginal cream Apply 3 times weekly as directed using a pea-sized amount on fingertip. 11/18/23   Shona Layman BROCKS, MD  famotidine  (PEPCID ) 20 MG tablet Take 1 tablet (20 mg total) by mouth 2 (two) times daily. 07/09/24     flecainide  (TAMBOCOR ) 50 MG tablet Take 1 tablet (50 mg total) by mouth 2 (two) times daily. 07/01/24   Aniceto Daphne CROME, NP  fluconazole  (DIFLUCAN ) 100 MG tablet Take 1 tablet (100 mg total) by mouth daily for 7 days Patient not taking: Reported on 08/25/2024 06/17/24   Roseann Adine PARAS., MD  FOLIC ACID PO Take 1  tablet by mouth at bedtime.    [provider]  hydrochlorothiazide  (HYDRODIURIL ) 25 MG tablet Take 1 tablet (25 mg total) by mouth daily. FOR BLOOD PRESSURE/SWELLING 12/22/23     Insulin  Pen Needle 32G X 4 MM MISC Inject 1 Needle into the skin 3 (three) times daily as directed 08/16/24     insulin  regular human CONCENTRATED (HUMULIN  R U-500 KWIKPEN) 500 UNIT/ML KwikPen Inject 60 Units into the skin 3 (three) times daily. 07/01/24     levocetirizine (XYZAL ) 5 MG tablet TAKE 1 TABLET BY MOUTH AT BEDTIME 10/19/23     meclizine  (ANTIVERT ) 25 MG tablet TAKE 1 TABLET ( 25MG ) BY MOUTH THREE TIME DAILY AS NEEDED FOR DIZZINESS    [provider]  methocarbamol  (ROBAXIN ) 500 MG tablet Take 1-2 tablets (500-1,000 mg total) by mouth every 6-8 (six to eight) hours as needed for spasms/muscle tension. Patient not  taking: Reported on 08/25/2024 07/02/24     mirabegron  ER (MYRBETRIQ ) 50 MG TB24 tablet Take 1 tablet (50 mg total) by mouth daily. 04/13/24   Stoneking, Adine PARAS., MD  montelukast  (SINGULAIR ) 10 MG tablet Take 1 tablet (10 mg total) by mouth daily. 04/06/24     naproxen  (NAPROSYN ) 500 MG tablet Take 1 tablet (500 mg total) by mouth 2 (two) times daily as needed. Patient not taking: Reported on 08/25/2024 08/19/24     nebivolol  (BYSTOLIC ) 10 MG tablet Take 1 tablet (10 mg total) by mouth at bedtime. 06/11/24     Olopatadine-Mometasone (RYALTRIS ) 665-25 MCG/ACT SUSP 2 sprays each nostril up to twice a day for runny or stuffy nose control Patient taking differently: 2 sprays each nostril up to twice a day for runny or stuffy nose control as needed 04/23/24   Jeneal Danita Macintosh, MD  ondansetron  (ZOFRAN -ODT) 4 MG disintegrating tablet Take 1 tablet (4 mg total) by mouth every 8 (eight) hours as needed for nausea for up to 7 days 11/30/23   Yolande Lamar BROCKS, MD  Probiotic Product (ALIGN PO) Take 1 capsule by mouth daily.    [provider]  rosuvastatin  (CRESTOR ) 5 MG tablet Take 1 tablet (5 mg total) by mouth daily. 03/24/24     Spacer/Aero-Holding Chambers (AEROCHAMBER PLUS) inhaler Use as instructed 10/17/17   Van Knee, MD  tirzepatide  (MOUNJARO ) 2.5 MG/0.5ML Pen Inject 2.5 mg into the skin once a week. 01/30/24       Physical Exam    Vital Signs:  Yalena Arvin Collet does not have vital signs available for review today.  Given telephonic nature of communication, physical exam is limited. AAOx3. NAD. Normal affect.  Speech and respirations are unlabored.  Accessory Clinical Findings    None  Assessment & Plan    1.  Preoperative Cardiovascular Risk Assessment:  According to the Revised Cardiac Risk Index (RCRI), her Perioperative Risk of Major Cardiac Event is (%): 0.9. Her Functional Capacity in METs is: 6.27 according to the [facility] Activity Status Index  (DASI).Therefore, based on ACC/AHA guidelines, patient would be at acceptable risk for the planned procedure without further cardiovascular testing.   The patient was advised that if she develops new symptoms prior to surgery to contact our office to arrange for a follow-up visit, and she verbalized understanding.  A copy of this note will be routed to requesting surgeon.  Time:   Today, I have spent 5 minutes with the patient with telehealth technology discussing medical history, symptoms, and management plan.     Damien BROCKS Braver, NP  08/31/2024,  2:48 PM

## 2024-09-01 ENCOUNTER — Encounter (HOSPITAL_COMMUNITY): Payer: Self-pay

## 2024-09-01 ENCOUNTER — Encounter (HOSPITAL_COMMUNITY)
Admission: RE | Admit: 2024-09-01 | Discharge: 2024-09-01 | Disposition: A | Source: Ambulatory Visit | Attending: Orthopedic Surgery

## 2024-09-01 ENCOUNTER — Other Ambulatory Visit: Payer: Self-pay

## 2024-09-01 VITALS — BP 135/88 | HR 65 | Temp 98.2°F | Resp 16 | Ht 62.0 in | Wt 196.0 lb

## 2024-09-01 DIAGNOSIS — Z794 Long term (current) use of insulin: Secondary | ICD-10-CM | POA: Diagnosis not present

## 2024-09-01 DIAGNOSIS — E119 Type 2 diabetes mellitus without complications: Secondary | ICD-10-CM

## 2024-09-01 DIAGNOSIS — K219 Gastro-esophageal reflux disease without esophagitis: Secondary | ICD-10-CM | POA: Diagnosis not present

## 2024-09-01 DIAGNOSIS — Z01812 Encounter for preprocedural laboratory examination: Secondary | ICD-10-CM | POA: Diagnosis not present

## 2024-09-01 DIAGNOSIS — I10 Essential (primary) hypertension: Secondary | ICD-10-CM | POA: Diagnosis not present

## 2024-09-01 DIAGNOSIS — S83519A Sprain of anterior cruciate ligament of unspecified knee, initial encounter: Secondary | ICD-10-CM | POA: Diagnosis not present

## 2024-09-01 HISTORY — DX: Pneumonia, unspecified organism: J18.9

## 2024-09-01 HISTORY — DX: Family history of other specified conditions: Z84.89

## 2024-09-01 HISTORY — DX: Myoneural disorder, unspecified: G70.9

## 2024-09-01 HISTORY — DX: Unspecified osteoarthritis, unspecified site: M19.90

## 2024-09-01 HISTORY — DX: Depression, unspecified: F32.A

## 2024-09-01 HISTORY — DX: Anxiety disorder, unspecified: F41.9

## 2024-09-01 LAB — CBC
HCT: 40.4 % (ref 36.0–46.0)
Hemoglobin: 12.6 g/dL (ref 12.0–15.0)
MCH: 23.3 pg — ABNORMAL LOW (ref 26.0–34.0)
MCHC: 31.2 g/dL (ref 30.0–36.0)
MCV: 74.7 fL — ABNORMAL LOW (ref 80.0–100.0)
Platelets: 350 K/uL (ref 150–400)
RBC: 5.41 MIL/uL — ABNORMAL HIGH (ref 3.87–5.11)
RDW: 15.3 % (ref 11.5–15.5)
WBC: 8.2 K/uL (ref 4.0–10.5)
nRBC: 0 % (ref 0.0–0.2)

## 2024-09-01 LAB — BASIC METABOLIC PANEL WITH GFR
Anion gap: 9 (ref 5–15)
BUN: 10 mg/dL (ref 6–20)
CO2: 27 mmol/L (ref 22–32)
Calcium: 9.8 mg/dL (ref 8.9–10.3)
Chloride: 103 mmol/L (ref 98–111)
Creatinine, Ser: 0.8 mg/dL (ref 0.44–1.00)
GFR, Estimated: >60 mL/min (ref >60)
Glucose, Bld: 250 mg/dL — ABNORMAL HIGH (ref 70–99)
Potassium: 4.1 mmol/L (ref 3.5–5.1)
Sodium: 140 mmol/L (ref 135–145)

## 2024-09-01 LAB — GLUCOSE, CAPILLARY: Glucose-Capillary: 291 mg/dL — ABNORMAL HIGH (ref 70–99)

## 2024-09-02 LAB — HEMOGLOBIN A1C
Hgb A1c MFr Bld: 10 % — ABNORMAL HIGH (ref 4.8–5.6)
Mean Plasma Glucose: 240 mg/dL

## 2024-09-02 NOTE — Progress Notes (Signed)
 Anesthesia Chart Review   Case: [id number] Date/Time: 09/06/24 0946   Procedure: KNEE ARTHROSCOPY WITH ANTERIOR CRUCIATE LIGAMENT (ACL) REPAIR (Right: Knee)   Anesthesia type: General   Pre-op diagnosis: RIGHT KNEE TEAR OF ANTERIOR CRUCIATE LIGAMENT   Location: [facility] ROOM 06 / [facility]   Surgeons: Yvone Rush, MD       DISCUSSION:48 y.o. never smoker with h/o PONV, HTN, GERD, WPW s/p ablation 2015, PVCs, insulin  dependent DM II (A1C 10.0), right knee ACL tear scheduled for above procedure 09/06/2024 with Dr. Rush Yvone.   Per cardiology preoperative evaluation 09/01/24, According to the Revised Cardiac Risk Index (RCRI), her Perioperative Risk of Major Cardiac Event is (%): 0.9. Her Functional Capacity in METs is: 6.27 according to the Duke Activity Status Index (DASI).Therefore, based on ACC/AHA guidelines, patient would be at acceptable risk for the planned procedure without further cardiovascular testing.  Pt advised to hold Mounjaro  1 week prior to procedure.   VS: BP 135/88   Pulse 65   Temp 36.8 C (Oral)   Resp 16   Ht 5' 2 (1.575 m)   Wt 88.9 kg   SpO2 100%   BMI 35.85 kg/m   PROVIDERS: Larnell Hamilton, MD is PCP   Cardiologist -EP - Danelle Birmingham, MD  LABS: labs forwarded to PCP and surgeon (all labs ordered are listed, but only abnormal results are displayed)  Labs Reviewed  HEMOGLOBIN A1C - Abnormal; Notable for the following components:      Result Value   Hgb A1c MFr Bld 10.0 (*)    All other components within normal limits  BASIC METABOLIC PANEL WITH GFR - Abnormal; Notable for the following components:   Glucose, Bld 250 (*)    All other components within normal limits  CBC - Abnormal; Notable for the following components:   RBC 5.41 (*)    MCV 74.7 (*)    MCH 23.3 (*)    All other components within normal limits  GLUCOSE, CAPILLARY - Abnormal; Notable for the following components:   Glucose-Capillary 291 (*)    All other components within normal limits      IMAGES:   EKG:   CV: Echo 07/26/24 1. Left ventricular ejection fraction, by estimation, is 60 to 65%. Left  ventricular ejection fraction by 3D volume is 60 %. The left ventricle has  normal function. The left ventricle has no regional wall motion  abnormalities. Left ventricular diastolic   parameters were normal. The average left ventricular global longitudinal  strain is -22.0 %. The global longitudinal strain is normal.   2. Right ventricular systolic function is normal. The right ventricular  size is normal. Tricuspid regurgitation signal is inadequate for assessing  PA pressure.   3. Normal left atrial strain, 40.9%.   4. The mitral valve is normal in structure. Trivial mitral valve  regurgitation. No evidence of mitral stenosis.   5. The aortic valve is tricuspid. Aortic valve regurgitation is not  visualized. No aortic stenosis is present.   6. The inferior vena cava is normal in size with greater than 50%  respiratory variability, suggesting right atrial pressure of 3 mmHg.  Past Medical History:  Diagnosis Date   Angio-edema    Anxiety    Arthritis    left knee   Asthma    Beta thalassemia trait    Complication of anesthesia    Depression    Diabetes mellitus without complication (HCC)    Dysrhythmia    WPW-ablation   Eczema  Family history of adverse reaction to anesthesia    Mom gets rapid heart beat   GERD (gastroesophageal reflux disease)    Headache    Hyperlipemia    Hypertension    Neuromuscular disorder (HCC)    neuropathy hands and feet   Obesity    Pneumonia    PONV (postoperative nausea and vomiting)    Recurrent upper respiratory infection (URI)    Urticaria    Wolff-Parkinson-White syndrome     Past Surgical History:  Procedure Laterality Date   ABDOMINAL HYSTERECTOMY     BLADDER REPAIR     CARPAL TUNNEL RELEASE Right 06/30/2024   Procedure: RELEASE, CARPAL TUNNEL, ENDOSCOPIC;  Surgeon: Sebastian Lenis, MD;  Location: [facility];  Service: Orthopedics;  Laterality: Right;  carpal tunnel release   CHOLECYSTECTOMY N/A 10/25/2021   Procedure: LAPAROSCOPIC CHOLECYSTECTOMY WITH INTRAOPERATIVE CHOLANGIOGRAM;  Surgeon: Belinda Cough, MD;  Location: [facility] OR;  Service: General;  Laterality: N/A;   CHONDROPLASTY Left 09/13/2015   Procedure: CHONDROPLASTY;  Surgeon: Dempsey Sensor, MD;  Location: [facility];  Service: Orthopedics;  Laterality: Left;   KNEE ARTHROSCOPY WITH DRILLING/MICROFRACTURE Left 09/13/2015   Procedure: KNEE ARTHROSCOPY WITH DRILLING/MICROFRACTURE;  Surgeon: Dempsey Sensor, MD;  Location: [facility];  Service: Orthopedics;  Laterality: Left;   NERVE TUNNEL DECOMPRESSION, UPPER EXTREMITY Right 06/30/2024   Procedure: NERVE TUNNEL DECOMPRESSION, UPPER EXTREMITY;  Surgeon: Sebastian Lenis, MD;  Location: [facility];  Service: Orthopedics;  Laterality: Right;  Ulnar nerve   REDUCTION MAMMAPLASTY Bilateral 2001   SUPRAVENTRICULAR TACHYCARDIA ABLATION N/A 07/04/2014   Procedure: WPW ABLATION;  Surgeon: Danelle LELON Birmingham, MD;  Location: [facility] CATH LAB;  Service: Cardiovascular;  Laterality: N/A;   TUBAL LIGATION      MEDICATIONS:  acetaminophen  (TYLENOL ) 325 MG tablet   albuterol  (VENTOLIN  HFA) 108 (90 Base) MCG/ACT inhaler   albuterol  (VENTOLIN  HFA) 108 (90 Base) MCG/ACT inhaler   ALPRAZolam  (XANAX ) 0.25 MG tablet   cephALEXin  (KEFLEX ) 250 MG capsule   Cholecalciferol (VITAMIN D3) 125 MCG (5000 UT) CAPS   Continuous Glucose Sensor (FREESTYLE LIBRE 3 PLUS SENSOR) MISC   Continuous Glucose Sensor (FREESTYLE LIBRE 3 SENSOR) MISC   dicyclomine  (BENTYL ) 20 MG tablet   estradiol  (DOTTI ) 0.075 MG/24HR   estradiol  (ESTRACE ) 0.1 MG/GM vaginal cream   famotidine  (PEPCID ) 20 MG tablet   flecainide  (TAMBOCOR ) 50 MG tablet   fluconazole  (DIFLUCAN ) 100 MG tablet   FOLIC ACID PO   hydrochlorothiazide  (HYDRODIURIL ) 25 MG tablet   Insulin  Pen Needle 32G X 4 MM MISC    insulin  regular human CONCENTRATED (HUMULIN  R U-500 KWIKPEN) 500 UNIT/ML KwikPen   levocetirizine (XYZAL ) 5 MG tablet   meclizine  (ANTIVERT ) 25 MG tablet   methocarbamol  (ROBAXIN ) 500 MG tablet   mirabegron  ER (MYRBETRIQ ) 50 MG TB24 tablet   montelukast  (SINGULAIR ) 10 MG tablet   naproxen  (NAPROSYN ) 500 MG tablet   nebivolol  (BYSTOLIC ) 10 MG tablet   Olopatadine-Mometasone (RYALTRIS ) 665-25 MCG/ACT SUSP   ondansetron  (ZOFRAN -ODT) 4 MG disintegrating tablet   Probiotic Product (ALIGN PO)   rosuvastatin  (CRESTOR ) 5 MG tablet   Spacer/Aero-Holding Chambers (AEROCHAMBER PLUS) inhaler   tirzepatide  (MOUNJARO ) 2.5 MG/0.5ML Pen   No current facility-administered medications for this encounter.      Harlene Hoots Ward, PA-C [facility] [phone]

## 2024-09-03 ENCOUNTER — Other Ambulatory Visit (HOSPITAL_COMMUNITY): Payer: Self-pay

## 2024-09-06 ENCOUNTER — Ambulatory Visit (HOSPITAL_COMMUNITY): Admission: RE | Admit: 2024-09-06 | Admitting: Orthopedic Surgery

## 2024-09-06 ENCOUNTER — Encounter (HOSPITAL_COMMUNITY): Admission: RE | Payer: Self-pay | Source: Home / Self Care

## 2024-09-06 ENCOUNTER — Encounter (HOSPITAL_COMMUNITY): Payer: Self-pay | Admitting: Physician Assistant

## 2024-09-06 SURGERY — KNEE ARTHROSCOPY WITH ANTERIOR CRUCIATE LIGAMENT (ACL) REPAIR
Anesthesia: General | Site: Knee | Laterality: Right

## 2024-09-07 ENCOUNTER — Other Ambulatory Visit (HOSPITAL_COMMUNITY): Payer: Self-pay

## 2024-09-07 DIAGNOSIS — Z794 Long term (current) use of insulin: Secondary | ICD-10-CM | POA: Diagnosis not present

## 2024-09-07 DIAGNOSIS — I10 Essential (primary) hypertension: Secondary | ICD-10-CM | POA: Diagnosis not present

## 2024-09-07 DIAGNOSIS — E1169 Type 2 diabetes mellitus with other specified complication: Secondary | ICD-10-CM | POA: Diagnosis not present

## 2024-09-07 DIAGNOSIS — I2510 Atherosclerotic heart disease of native coronary artery without angina pectoris: Secondary | ICD-10-CM | POA: Diagnosis not present

## 2024-09-08 ENCOUNTER — Other Ambulatory Visit (HOSPITAL_COMMUNITY): Payer: Self-pay

## 2024-09-08 MED ORDER — FREESTYLE LIBRE 3 PLUS SENSOR MISC
3 refills | Status: AC
  Filled 2024-09-08: qty 6, 90d supply, fill #0

## 2024-09-09 ENCOUNTER — Encounter: Payer: Self-pay | Admitting: Urology

## 2024-09-09 ENCOUNTER — Ambulatory Visit (INDEPENDENT_AMBULATORY_CARE_PROVIDER_SITE_OTHER): Admitting: Urology

## 2024-09-09 VITALS — BP 118/71 | HR 71 | Ht 62.0 in | Wt 194.0 lb

## 2024-09-09 DIAGNOSIS — N3281 Overactive bladder: Secondary | ICD-10-CM | POA: Diagnosis not present

## 2024-09-09 DIAGNOSIS — Z87440 Personal history of urinary (tract) infections: Secondary | ICD-10-CM

## 2024-09-09 DIAGNOSIS — B3749 Other urogenital candidiasis: Secondary | ICD-10-CM | POA: Diagnosis not present

## 2024-09-09 DIAGNOSIS — N390 Urinary tract infection, site not specified: Secondary | ICD-10-CM | POA: Diagnosis not present

## 2024-09-09 LAB — URINALYSIS, ROUTINE W REFLEX MICROSCOPIC
Bilirubin, UA: NEGATIVE
Ketones, UA: NEGATIVE
Leukocytes,UA: NEGATIVE
Nitrite, UA: NEGATIVE
Protein,UA: NEGATIVE
RBC, UA: NEGATIVE
Specific Gravity, UA: 1.02 (ref 1.005–1.030)
Urobilinogen, Ur: 0.2 mg/dL (ref 0.2–1.0)
pH, UA: 6 (ref 5.0–7.5)

## 2024-09-09 LAB — MICROSCOPIC EXAMINATION

## 2024-09-09 NOTE — Progress Notes (Addendum)
 Assessment: 1. OAB (overactive bladder)   2. History of UTI     Plan: Continue methods to reduce the risk of UTIs including increase fluid intake, timed and double voiding, daily cranberry supplementation, daily probiotic, vaginal estrogen cream, and antibiotic suppression with Keflex . Continue daily antibiotic. Continue Myrbetriq  50 mg daily.   Return to office in 3-4 months.  Chief Complaint: Chief Complaint  Patient presents with   Over Active Bladder    HPI: Carolyn Phillips is a 48 y.o. female who presents for continued evaluation of recurrent UTIs and overactive bladder. She has been previously evaluated by Dr. Shona and was last seen in February 2025.  History: Past medical history is remarkable for WPW ablation 2015, hypertension, hyperlipidemia, diabetes. Prior GU history of remarkable for abdominal hysterectomy and bladder repair in 2010.  She underwent an open Burch procedure for genuine stress incontinence in [place]  in 2011. Patient with history of multiple intermittent episodes compatible with simple cystitis  treated with multiple courses of antibiotics over the last 6 months.  These episodes are without culture documentation.  She also reported frequency, urgency, and occasional urge incontinence.  Her urinary symptoms have been progressing over the last few years. She presented 09/13/2023 with flank discomfort and significant dysuria and frequency.  She was noted to have significant microscopic hematuria and pyuria.  Urine culture grew mixed flora.  Patient was treated empirically with Omnicef  300 twice daily for 7 days.   CT A/P 09/26/2023--no acute findings. Adrenals/Urinary Tract: Adrenal glands are within normal limits. Kidneys demonstrate a normal enhancement pattern bilaterally. No renal calculi or obstructive changes are seen. Normal excretion is noted on delayed images. Bladder is decompressed. Reproductive: Status post hysterectomy. No adnexal  masses.  Urine culture from 2/25 grew >100 K Klebsiella.  She was previously on daily suppression with low-dose Bactrim . She was changed to Keflex  250 mg daily in February 2025. Cystoscopy from 2/25 showed no urethral or bladder abnormalities. She was on Vesicare  for her OAB symptoms.  This was discontinued in February 2025 due to some difficulty voiding.  At her visit in May 2025, she continued on daily cephalexin  for UTI prevention.  She was also using vaginal hormone cream.  No UTI symptoms.  No dysuria or gross hematuria.  She continued to have symptoms of frequency, urgency, and urge incontinence. Urine culture grew 50-100 K lactobacillus.  No treatment recommended. She was given a trial of Gemtesa  75 mg daily.  This medication works very well for her.  Unfortunately, insurance would not cover the Gemtesa  unless she tried other medications. She was provided a prescription for Myrbetriq  50 mg daily in July 2025.  At her visit in September 2025, she continued on Myrbetriq  50 mg daily with good control of her bladder symptoms.  She reported some dry mouth.  She was taking the medication at night.  She was also on Bentyl  3 times a day.  She had recently noted some cloudy urine.  No dysuria or gross hematuria.  She continued on daily cephalexin  and vaginal hormone cream.  She returns today for follow-up.  She continues on daily cephalexin .  She has not had any recent UTI symptoms.  She also continues on Myrbetriq  50 mg daily.  She reports good control of her bladder symptoms with medication.  Her dry mouth is stable.  No dysuria or gross hematuria.  Portions of the above documentation were copied from a prior visit for review purposes only.  Allergies: Allergies  Allergen Reactions  Penicillins Hives and Swelling    Received cefazolin  in 2023 without incident Has patient had a PCN reaction causing immediate rash, facial/tongue/throat swelling, SOB or lightheadedness with hypotension:  Yes Has patient had a PCN reaction causing severe rash involving mucus membranes or skin necrosis: No Has patient had a PCN reaction that required hospitalization No Has patient had a PCN reaction occurring within the last 10 years: No If all of the above answers are NO, then may proceed with Cephalosporin use.   Eye swelling   Anesthesia S-I-40a [Propofol ] Nausea And Vomiting   Empagliflozin  Other (See Comments)    Constant UTIs   Metoprolol Other (See Comments)    LOL Drugs cause hair to fall out   Nitrofurantoin Macrocrystal Other (See Comments)    Pt unsure of reaction     PMH: Past Medical History:  Diagnosis Date   Angio-edema    Anxiety    Arthritis    left knee   Asthma    Beta thalassemia trait    Complication of anesthesia    Depression    Diabetes mellitus without complication (HCC)    Dysrhythmia    WPW-ablation   Eczema    Family history of adverse reaction to anesthesia    Mom gets rapid heart beat   GERD (gastroesophageal reflux disease)    Headache    Hyperlipemia    Hypertension    Neuromuscular disorder (HCC)    neuropathy hands and feet   Obesity    Pneumonia    PONV (postoperative nausea and vomiting)    Recurrent upper respiratory infection (URI)    Urticaria    Wolff-Parkinson-White syndrome     PSH: Past Surgical History:  Procedure Laterality Date   ABDOMINAL HYSTERECTOMY     BLADDER REPAIR     CARPAL TUNNEL RELEASE Right 06/30/2024   Procedure: RELEASE, CARPAL TUNNEL, ENDOSCOPIC;  Surgeon: Sebastian Lenis, MD;  Location: [facility];  Service: Orthopedics;  Laterality: Right;  carpal tunnel release   CHOLECYSTECTOMY N/A 10/25/2021   Procedure: LAPAROSCOPIC CHOLECYSTECTOMY WITH INTRAOPERATIVE CHOLANGIOGRAM;  Surgeon: Belinda Cough, MD;  Location: [facility] OR;  Service: General;  Laterality: N/A;   CHONDROPLASTY Left 09/13/2015   Procedure: CHONDROPLASTY;  Surgeon: Dempsey Sensor, MD;  Location: [facility];   Service: Orthopedics;  Laterality: Left;   KNEE ARTHROSCOPY WITH DRILLING/MICROFRACTURE Left 09/13/2015   Procedure: KNEE ARTHROSCOPY WITH DRILLING/MICROFRACTURE;  Surgeon: Dempsey Sensor, MD;  Location: [facility];  Service: Orthopedics;  Laterality: Left;   NERVE TUNNEL DECOMPRESSION, UPPER EXTREMITY Right 06/30/2024   Procedure: NERVE TUNNEL DECOMPRESSION, UPPER EXTREMITY;  Surgeon: Sebastian Lenis, MD;  [facility];  Service: Orthopedics;  Laterality: Right;  Ulnar nerve   REDUCTION MAMMAPLASTY Bilateral 2001   SUPRAVENTRICULAR TACHYCARDIA ABLATION N/A 07/04/2014   Procedure: WPW ABLATION;  Surgeon: Danelle LELON Birmingham, MD;  Location: [facility] CATH LAB;  Service: Cardiovascular;  Laterality: N/A;   TUBAL LIGATION      SH: Social History   Tobacco Use   Smoking status: Never   Smokeless tobacco: Never   Tobacco comments:    only smoked for one year al ong time ago   Vaping Use   Vaping status: Never Used  Substance Use Topics   Alcohol  use: Yes    Alcohol /week: 0.0 standard drinks of alcohol     Comment: occasional   Drug use: No    ROS: Constitutional:  Negative for fever, chills, weight loss CV: Negative for chest pain, previous MI,  hypertension Respiratory:  Negative for shortness of breath, wheezing, sleep apnea, frequent cough GI:  Negative for nausea, vomiting, bloody stool, GERD  PE: BP 118/71   Pulse 71   Ht 5' 2 (1.575 m)   Wt 194 lb (88 kg)   BMI 35.48 kg/m  GENERAL APPEARANCE:  Well appearing, well developed, well nourished, NAD HEENT:  Atraumatic, normocephalic, oropharynx clear NECK:  Supple without lymphadenopathy or thyromegaly ABDOMEN:  Soft, non-tender, no masses EXTREMITIES:  Moves all extremities well, without clubbing, cyanosis, or edema NEUROLOGIC:  Alert and oriented x 3, normal gait, CN II-XII grossly intact MENTAL STATUS:  appropriate BACK:  Non-tender to palpation, No CVAT SKIN:  Warm, dry, and  intact   Results: U/A: 0-5 WBCs, 0-2 RBCs, moderate bacteria

## 2024-09-10 ENCOUNTER — Other Ambulatory Visit (HOSPITAL_COMMUNITY): Payer: Self-pay

## 2024-09-12 NOTE — Progress Notes (Deleted)
 [facility] Return Visit   Referring Provider Larnell Hamilton, MD [address] [place],  [place] [address]  Primary Care Provider Larnell Hamilton, MD  Patient Profile: Carolyn Phillips is a 48 y.o. female with a past medical history of Wolff-Parkinson-White, HTN, OSA, T2DM, urinary incontinence, dyslipidemia who returns to the [facility] for follow-up of the problem(s) noted below.  Problem List: GERD IBS-D History of adenomatous colon polyps Family history of colon polyps Hepatic steatosis   History of Present Illness    Discussed the use of AI scribe software for clinical note transcription with the patient, who gave verbal consent to proceed.  History of Present Illness Carolyn Phillips is a 48 year old female with a past medical history noteworthy for Wolff-Parkinson-White, HTN, OSA, T2DM, urinary incontinence and dyslipidemia who returns to the gastroenterology office for follow-up of GERD and IBS-D    Current GI Meds  Famotidine  20 mg p.o. twice daily  Interval History     IBS-D - Completed EGD/colonoscopy 01/2024 overall normal without H. pylori, celiac disease, IBD or microscopic colitis  - Ongoing abdominal discomfort, bloating, and distension - Symptoms improve after bowel movements - No associated gassiness  - Bowel urgency, particularly in the morning and within 30-40 minutes after eating - Stools generally soft and formed - Sensation of incomplete evacuation - No constipation  - Dietary modifications, including consuming high-carbohydrate and high-fat foods earlier in the day and focusing on protein and vegetables for dinner, have not alleviated symptoms - Benefiber and pre/probiotic supplement used to maintain regular bowel movements - Mounjaro  taken once weekly  History of adenomatous colon polyps - History of colonic polyps with previous colonoscopy 2023 revealing 4mm and 6mm polyps - TA - No polyps on most  recent colonoscopy 2025 - Reports that her mother had colon polyps - No family history of colorectal cancer  GERD - EGD 2025 only showed fundic gland polyps - Currently stable on famotidine  20 mg p.o. twice daily - Previous symptoms may be related to Mounjaro   Hepatic steatosis - CT imaging 11/2023 showed mild hepatic steatosis - LFTs generally normal except ALT 37 in March 2025 - No documented family history of liver disease - Metabolic risk factors for MASLD: T2DM, HLD, obesity -  Fibrosis 4 Score = .53 (Low risk)           GI Review of Symptoms Significant for fecal urgency, bloating, soft stool. Otherwise negative.  General Review of Systems  Review of systems is significant for the pertinent positives and negatives as listed per the HPI.  Full ROS is otherwise negative.  Past Medical History   Past Medical History:  Diagnosis Date   Angio-edema    Anxiety    Arthritis    left knee   Asthma    Beta thalassemia trait    Complication of anesthesia    Depression    Diabetes mellitus without complication (HCC)    Dysrhythmia    WPW-ablation   Eczema    Family history of adverse reaction to anesthesia    Mom gets rapid heart beat   GERD (gastroesophageal reflux disease)    Headache    Hyperlipemia    Hypertension    Neuromuscular disorder (HCC)    neuropathy hands and feet   Obesity    Pneumonia    PONV (postoperative nausea and vomiting)    Recurrent upper respiratory infection (URI)    Urticaria    Wolff-Parkinson-White syndrome      Past Surgical History  Past Surgical History:  Procedure Laterality Date   ABDOMINAL HYSTERECTOMY     BLADDER REPAIR     CARPAL TUNNEL RELEASE Right 06/30/2024   Procedure: RELEASE, CARPAL TUNNEL, ENDOSCOPIC;  Surgeon: Sebastian Lenis, MD;  Location: [facility];  Service: Orthopedics;  Laterality: Right;  carpal tunnel release   CHOLECYSTECTOMY N/A 10/25/2021   Procedure: LAPAROSCOPIC CHOLECYSTECTOMY  WITH INTRAOPERATIVE CHOLANGIOGRAM;  Surgeon: Belinda Cough, MD;  Location: [facility] OR;  Service: General;  Laterality: N/A;   CHONDROPLASTY Left 09/13/2015   Procedure: CHONDROPLASTY;  Surgeon: Dempsey Sensor, MD;  Location: [facility];  Service: Orthopedics;  Laterality: Left;   KNEE ARTHROSCOPY WITH DRILLING/MICROFRACTURE Left 09/13/2015   Procedure: KNEE ARTHROSCOPY WITH DRILLING/MICROFRACTURE;  Surgeon: Dempsey Sensor, MD;  Location: [facility];  Service: Orthopedics;  Laterality: Left;   NERVE TUNNEL DECOMPRESSION, UPPER EXTREMITY Right 06/30/2024   Procedure: NERVE TUNNEL DECOMPRESSION, UPPER EXTREMITY;  Surgeon: Sebastian Lenis, MD;  Location: [facility];  Service: Orthopedics;  Laterality: Right;  Ulnar nerve   REDUCTION MAMMAPLASTY Bilateral 2001   SUPRAVENTRICULAR TACHYCARDIA ABLATION N/A 07/04/2014   Procedure: WPW ABLATION;  Surgeon: Danelle LELON Birmingham, MD;  Location: [facility] CATH LAB;  Service: Cardiovascular;  Laterality: N/A;   TUBAL LIGATION       Allergies and Medications   Allergies  Allergen Reactions   Penicillins Hives and Swelling    Received cefazolin  in 2023 without incident Has patient had a PCN reaction causing immediate rash, facial/tongue/throat swelling, SOB or lightheadedness with hypotension: Yes Has patient had a PCN reaction causing severe rash involving mucus membranes or skin necrosis: No Has patient had a PCN reaction that required hospitalization No Has patient had a PCN reaction occurring within the last 10 years: No If all of the above answers are NO, then may proceed with Cephalosporin use.   Eye swelling   Anesthesia S-I-40a [Propofol ] Nausea And Vomiting   Empagliflozin  Other (See Comments)    Constant UTIs   Metoprolol Other (See Comments)    LOL Drugs cause hair to fall out   Nitrofurantoin Macrocrystal Other (See Comments)    Pt unsure of reaction    No outpatient medications have been marked as taking for  the 09/14/24 encounter (Appointment) with Genise Strack, Inocente HERO, MD.     Family His   Family History  Problem Relation Age of Onset   Eczema Mother    Asthma Mother    Angioedema Mother    Allergic rhinitis Mother    Valvular heart disease Mother        Aortic valve replacement   Diabetes Mother    Angioedema Sister    Diabetes Sister    Breast cancer Sister        Half Sister   Eczema Brother    Asthma Brother    Allergic rhinitis Brother    Diabetes Brother    Asthma Maternal Aunt    Angioedema Maternal Aunt    Allergic rhinitis Maternal Aunt    Asthma Maternal Aunt    Angioedema Maternal Aunt    Angioedema Maternal Aunt    Allergic rhinitis Maternal Uncle    Allergic rhinitis Paternal Aunt    Breast cancer Paternal Aunt    Angioedema Paternal Uncle    Allergic rhinitis Paternal Uncle    Heart attack Maternal Grandmother        2s   Stroke Maternal Grandmother    Breast cancer Cousin 8   Urticaria Son    Allergic  rhinitis Son    Eczema Son    Asthma Son    GI Specific Family History: Mother-colon polyps  Social History   Social History   Tobacco Use   Smoking status: Never   Smokeless tobacco: Never   Tobacco comments:    only smoked for one year al ong time ago   Vaping Use   Vaping status: Never Used  Substance Use Topics   Alcohol  use: Yes    Alcohol /week: 0.0 standard drinks of alcohol     Comment: occasional   Drug use: No   Nicolle reports that she has never smoked. She has never used smokeless tobacco. She reports current alcohol  use. She reports that she does not use drugs.  Vital Signs and Physical Examination   There were no vitals filed for this visit.    General: Well developed, well nourished, no acute distress Head: Normocephalic and atraumatic Eyes: Sclerae anicteric, EOMI Lungs: Clear throughout to auscultation Heart: Regular rate and rhythm; No murmurs, rubs or bruits Abdomen: Soft, non tender and non distended. No masses,  hepatosplenomegaly or hernias noted. Normal Bowel sounds Rectal: Deferred Musculoskeletal: Symmetrical with no gross deformities     Review of Data   The following data was reviewed at the time of this encounter:   Laboratory Studies      Latest Ref Rng & Units 09/01/2024   12:00 PM 01/27/2024    4:15 PM 12/10/2023    2:43 PM  CBC  WBC 4.0 - 10.5 K/uL 8.2  7.6  6.8   Hemoglobin 12.0 - 15.0 g/dL 87.3  87.2  87.2   Hematocrit 36.0 - 46.0 % 40.4  39.7  39.5   Platelets 150 - 400 K/uL 350  360.0  391.0     Lab Results  Component Value Date   LIPASE 84 (H) 11/30/2023      Latest Ref Rng & Units 09/01/2024   12:00 PM 06/29/2024   12:04 PM 01/27/2024    4:15 PM  CMP  Glucose 70 - 99 mg/dL 749  643  832   BUN 6 - 20 mg/dL 10  8  11    Creatinine 0.44 - 1.00 mg/dL 9.19  9.10  8.88   Sodium 135 - 145 mmol/L 140  135  137   Potassium 3.5 - 5.1 mmol/L 4.1  4.8  3.5   Chloride 98 - 111 mmol/L 103  100  98   CO2 22 - 32 mmol/L 27  25  34   Calcium  8.9 - 10.3 mg/dL 9.8  9.3  9.4   Total Protein 6.0 - 8.3 g/dL   7.3   Total Bilirubin 0.2 - 1.2 mg/dL   0.4   Alkaline Phos 39 - 117 U/L   71   AST 0 - 37 U/L   20   ALT 0 - 35 U/L   26     Lab Results  Component Value Date   ESRSEDRATE 36 (H) 01/27/2024   CRP 2.3  11/2023 Celiac panel negative Stool panel positive for norovirus  Imaging Studies  CTAP 11/30/2023 1. Fluid-filled stomach and small bowel without evidence of obstruction or inflammation, possibly reflecting gastroenteritis. 2. No other acute findings or explanation for the patient's symptoms. The appendix appears normal. 3. Stable mild hepatic steatosis.   CTAP 09/26/2023 Chronic changes without acute abnormality.   CTAP 12/2021 No acute findings in the abdomen or pelvis.   GI Procedures and Studies  EGD/Colonoscopy 01/2024 EGD - a few diminutive sessile polyps in  the gastric fundus and body Colonoscopy - normal colon and TI, IH Path: Fundic gland polyps,  normal gastric mucosa without H. pylori, normal TI and colon biopsies  01/18/2022 colonoscopy with endoscopy with Dr. Elicia for rectal bleeding and abdominal discomfort good bowel prep showed hemorrhoids, normal TI, 4 mm polyp transverse colon 6 mm polyp sigmoid colon normal mucosa biopsies were taken, internal hemorrhoids small Pathology showed tubular adenomatous polyps and colonic mucosa negative for lymphocytic and collagenous colitis or dysplasia  Notes also state previous colonoscopy December 2020 small polyp sigmoid colon and internal hemorrhoids showed benign polypoid tissue repeat 5 years due to family history   Clinical Impression  It is my clinical impression that Ms. Cramer is a 48 y.o. female with;  GERD IBS-D History of adenomatous colon polyps Family history of colon polyps Hepatic steatosis  Ms. Whitsell returns to the office today to follow-up recent EGD and colonoscopy for symptoms of abdominal pain, bloating, diarrhea and GERD.  Her EGD and colonoscopy were reassuring and ruled out H. pylori infection, celiac disease, inflammatory bowel disease and microscopic colitis.  Her constellation of symptoms in the setting of otherwise normal findings appears to suggest a diagnosis of GERD and IBS-D.  Both of these conditions may be exacerbated by her use of Mounjaro .  For management of GERD I have advised dietary and lifestyle modification as well as continuing H2 blocker therapy.  In terms of her lower GI symptoms she is particularly bothered by bloating and fecal urgency.  Reviewed that these could be related to GLP-1 medication use.  She is already taking pre and probiotics.  Dietary modification is advised and I have suggested a trial of amine to see if this improves her fecal urgency.  We reviewed the results of her previous colonoscopy in 2023 which disclosed 2 tubular adenomas.  Most recent colonoscopy was normal without polyps.  She reports a family history of colon polyps.   I suggested a follow-up colonoscopy in 5 years given her family history of colon polyps and previous history of tubular adenomas.  Previous cross-sectional imaging showed evidence of mild hepatic steatosis.  Her liver enzymes have generally been normal with the exception of a mildly elevated ALT in spring 2025.  Given her metabolic risk factors of obesity, diabetes and HLD suspect she likely has a form of MASLD.  Fib 4 low risk for fibrosis.  Plan  Continue famotidine  20 mg p.o. twice daily GERD diet and lifestyle modification Continue pre and probiotics Trial of dicyclomine  20 mg p.o. 3 times daily as needed for equal urgency-have suggested scheduling this before meals Continue dietary modification for IBS If bloating persist consider trial of Xifaxan  for IBS-D Next surveillance colonoscopy due 01/2029 Monitor LFTs every 6 to 12 months in the setting of suspected MASLD; ultrasound 3 years Monitor weight and anthropometrics   Planned Follow Up No follow-ups on file.  The patient or caregiver verbalized understanding of the material covered, with no barriers to understanding. All questions were answered. Patient or caregiver is agreeable with the plan outlined above.    It was a pleasure to see Shanty.  If you have any questions or concerns regarding this evaluation, do not hesitate to contact me.  Inocente Hausen, MD [facility]

## 2024-09-14 ENCOUNTER — Ambulatory Visit: Admitting: Pediatrics

## 2024-09-17 ENCOUNTER — Other Ambulatory Visit (HOSPITAL_COMMUNITY): Payer: Self-pay

## 2024-09-17 DIAGNOSIS — S83261A Peripheral tear of lateral meniscus, current injury, right knee, initial encounter: Secondary | ICD-10-CM | POA: Diagnosis not present

## 2024-09-17 DIAGNOSIS — S83511A Sprain of anterior cruciate ligament of right knee, initial encounter: Secondary | ICD-10-CM | POA: Diagnosis not present

## 2024-09-17 DIAGNOSIS — S83281A Other tear of lateral meniscus, current injury, right knee, initial encounter: Secondary | ICD-10-CM | POA: Diagnosis not present

## 2024-09-17 DIAGNOSIS — M948X6 Other specified disorders of cartilage, lower leg: Secondary | ICD-10-CM | POA: Diagnosis not present

## 2024-09-17 DIAGNOSIS — M94261 Chondromalacia, right knee: Secondary | ICD-10-CM | POA: Diagnosis not present

## 2024-09-17 MED ORDER — TIZANIDINE HCL 2 MG PO TABS
2.0000 mg | ORAL_TABLET | Freq: Three times a day (TID) | ORAL | 0 refills | Status: AC | PRN
  Filled 2024-09-17: qty 30, 10d supply, fill #0

## 2024-09-17 MED ORDER — CEFADROXIL 500 MG PO CAPS
500.0000 mg | ORAL_CAPSULE | Freq: Two times a day (BID) | ORAL | 0 refills | Status: AC
  Filled 2024-09-17: qty 14, 7d supply, fill #0

## 2024-09-17 MED ORDER — HYDROCODONE-ACETAMINOPHEN 5-325 MG PO TABS
1.0000 | ORAL_TABLET | ORAL | 0 refills | Status: AC | PRN
  Filled 2024-09-17: qty 30, 5d supply, fill #0

## 2024-09-27 ENCOUNTER — Other Ambulatory Visit (HOSPITAL_COMMUNITY): Payer: Self-pay

## 2024-09-27 DIAGNOSIS — R2689 Other abnormalities of gait and mobility: Secondary | ICD-10-CM | POA: Diagnosis not present

## 2024-09-27 DIAGNOSIS — M25561 Pain in right knee: Secondary | ICD-10-CM | POA: Diagnosis not present

## 2024-09-27 DIAGNOSIS — R531 Weakness: Secondary | ICD-10-CM | POA: Diagnosis not present

## 2024-09-27 DIAGNOSIS — S83511D Sprain of anterior cruciate ligament of right knee, subsequent encounter: Secondary | ICD-10-CM | POA: Diagnosis not present

## 2024-10-01 ENCOUNTER — Other Ambulatory Visit (HOSPITAL_COMMUNITY): Payer: Self-pay

## 2024-10-05 ENCOUNTER — Other Ambulatory Visit (HOSPITAL_COMMUNITY): Payer: Self-pay

## 2024-10-27 ENCOUNTER — Ambulatory Visit: Admitting: Allergy

## 2024-11-01 ENCOUNTER — Other Ambulatory Visit: Payer: Self-pay

## 2024-11-01 ENCOUNTER — Encounter (HOSPITAL_COMMUNITY): Payer: Self-pay

## 2024-11-01 ENCOUNTER — Other Ambulatory Visit (HOSPITAL_COMMUNITY): Payer: Self-pay

## 2024-11-01 MED ORDER — LEVOCETIRIZINE DIHYDROCHLORIDE 5 MG PO TABS
5.0000 mg | ORAL_TABLET | Freq: Every day | ORAL | 4 refills | Status: AC
  Filled 2024-11-01: qty 90, 90d supply, fill #0

## 2024-11-01 MED ORDER — HYDROCHLOROTHIAZIDE 25 MG PO TABS
25.0000 mg | ORAL_TABLET | Freq: Every day | ORAL | 3 refills | Status: AC
  Filled 2024-11-01: qty 90, 90d supply, fill #0

## 2024-11-02 ENCOUNTER — Other Ambulatory Visit (HOSPITAL_COMMUNITY): Payer: Self-pay

## 2024-11-03 ENCOUNTER — Other Ambulatory Visit (HOSPITAL_COMMUNITY): Payer: Self-pay

## 2024-11-03 MED ORDER — HUMULIN R U-500 KWIKPEN 500 UNIT/ML ~~LOC~~ SOPN: PEN_INJECTOR | SUBCUTANEOUS | 5 refills | Status: AC

## 2024-11-03 MED ORDER — MOUNJARO 5 MG/0.5ML ~~LOC~~ SOAJ
5.0000 mg | SUBCUTANEOUS | 5 refills | Status: AC
  Filled 2024-11-03: qty 2, 28d supply, fill #0

## 2024-11-04 ENCOUNTER — Other Ambulatory Visit (HOSPITAL_COMMUNITY): Payer: Self-pay

## 2024-11-18 ENCOUNTER — Ambulatory Visit: Admitting: Allergy

## 2025-01-20 ENCOUNTER — Ambulatory Visit: Admitting: Urology
